# Patient Record
Sex: Female | Born: 1970 | Race: White | Hispanic: No | Marital: Married | Smoking: Former smoker
Health system: Southern US, Community
[De-identification: ages and names within clinical notes are randomized; demographics above are authoritative.]

---

## 2016-01-24 ENCOUNTER — Ambulatory Visit (INDEPENDENT_AMBULATORY_CARE_PROVIDER_SITE_OTHER): Payer: 59

## 2016-01-24 ENCOUNTER — Encounter: Payer: Self-pay | Admitting: Podiatry

## 2016-01-24 ENCOUNTER — Ambulatory Visit (INDEPENDENT_AMBULATORY_CARE_PROVIDER_SITE_OTHER): Payer: 59 | Admitting: Podiatry

## 2016-01-24 ENCOUNTER — Ambulatory Visit: Payer: Self-pay

## 2016-01-24 ENCOUNTER — Ambulatory Visit: Payer: Self-pay | Admitting: Podiatry

## 2016-01-24 VITALS — Ht 63.0 in | Wt 130.0 lb

## 2016-01-24 DIAGNOSIS — M79672 Pain in left foot: Secondary | ICD-10-CM

## 2016-01-24 DIAGNOSIS — M79671 Pain in right foot: Secondary | ICD-10-CM

## 2016-01-24 DIAGNOSIS — Q828 Other specified congenital malformations of skin: Secondary | ICD-10-CM | POA: Diagnosis not present

## 2016-01-24 DIAGNOSIS — M779 Enthesopathy, unspecified: Secondary | ICD-10-CM | POA: Diagnosis not present

## 2016-01-24 MED ORDER — TRIAMCINOLONE ACETONIDE 10 MG/ML IJ SUSP
10.0000 mg | Freq: Once | INTRAMUSCULAR | Status: AC
  Administered 2016-01-24: 10 mg

## 2016-01-24 MED ORDER — DICLOFENAC SODIUM 75 MG PO TBEC: 75.0000 mg | DELAYED_RELEASE_TABLET | Freq: Two times a day (BID) | ORAL | Status: DC

## 2016-01-24 NOTE — Progress Notes (Signed)
   Subjective:    Patient ID: Jennifer King, female    DOB: 07-14-1971, 45 y.o.   MRN: [id number]  HPI Chief Complaint  Patient presents with  . Foot Pain    Right foot; medial side-arch area; Left foot-plantar forefoot-below 4th toe; pt stated, "Feels bone in foot; hurts foot; walking on the right side of the foot; feels bone spur in Left foot"      Review of Systems  All other systems reviewed and are negative.      Objective:   Physical Exam        Assessment & Plan:

## 2016-02-07 ENCOUNTER — Ambulatory Visit (INDEPENDENT_AMBULATORY_CARE_PROVIDER_SITE_OTHER): Payer: 59 | Admitting: Podiatry

## 2016-02-07 DIAGNOSIS — M779 Enthesopathy, unspecified: Secondary | ICD-10-CM

## 2016-02-07 DIAGNOSIS — Q828 Other specified congenital malformations of skin: Secondary | ICD-10-CM | POA: Diagnosis not present

## 2016-02-08 NOTE — Progress Notes (Signed)
Subjective:     Patient ID: Jennifer King, female   DOB: 1970-08-31, 45 y.o.   MRN: [id number]  HPI patient states that she has a lot of pain on the side of her right foot and she's getting some lesion on the plantar aspect left foot which is sore when pressed. States that it's making it hard to walk on the right foot and she does not remember specific injury   Review of Systems  All other systems reviewed and are negative.      Objective:   Physical Exam  Constitutional: She is oriented to person, place, and time.  Cardiovascular: Intact distal pulses.   Musculoskeletal: Normal range of motion.  Neurological: She is oriented to person, place, and time.  Skin: Skin is warm.  Nursing note and vitals reviewed.  neurovascular status intact muscle strength adequate range of motion within normal limits with patient found to have inflammatory changes on the right foot posterior tibial tendon near its insertion into the navicular. Patient also is noted to have a small lesion plantar aspect left and moderate depression of the arch bilateral and has good digital perfusion and is well oriented 3     Assessment:     Inflammatory tendinitis right posterior tib near its insertion into the navicular with keratotic lesion left    Plan:     H&P and x-ray reviewed. Today I did careful sheath injection 3 mg Dexon some Kenalog 5 mg Xylocaine and up applied fascial brace to lift up the right arch along with ice therapy and reduced activity and not going barefoot. Debrided lesion on left which was tolerated well  X-ray report indicates moderate depression of the arch with no indication of fracture arthritis

## 2016-02-08 NOTE — Progress Notes (Signed)
Subjective:     Patient ID: Jennifer King, female   DOB: [birth year], 45 y.o.   MRN: [id number]  HPI patient states I'm doing a lot better but I did overdo it a little bit yesterday but having minimal discomfort   Review of Systems     Objective:   Physical Exam Neurovascular status intact muscle strength adequate with discomfort in the right posterior tib that's minimal in nature and mild depression of the arch bilateral    Assessment:     Tendinitis symptoms still present but improved    Plan:     Advised on the importance of physical therapy supportive shoe gear usage anti-inflammatories and not going barefoot. Reappoint if symptoms persist and we'll consider orthotics

## 2016-03-06 ENCOUNTER — Ambulatory Visit (INDEPENDENT_AMBULATORY_CARE_PROVIDER_SITE_OTHER): Payer: 59 | Admitting: Podiatry

## 2016-03-06 ENCOUNTER — Encounter: Payer: Self-pay | Admitting: Podiatry

## 2016-03-06 DIAGNOSIS — M779 Enthesopathy, unspecified: Secondary | ICD-10-CM

## 2016-03-07 NOTE — Progress Notes (Signed)
Subjective:     Patient ID: Jennifer King, female   DOB: 15-May-1971, 45 y.o.   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

## 2016-04-04 ENCOUNTER — Ambulatory Visit: Payer: 59 | Admitting: *Deleted

## 2016-04-04 DIAGNOSIS — M779 Enthesopathy, unspecified: Secondary | ICD-10-CM

## 2016-04-04 NOTE — Progress Notes (Signed)
Patient ID: Jennifer King, female   DOB: [birth year], 45 y.o.   MRN: [id number]  Patient presents for orthotic pick up.  Verbal and written break in and wear instructions given.  Patient will follow up in 4 weeks if symptoms worsen or fail to improve.

## 2016-04-04 NOTE — Patient Instructions (Signed)

## 2016-07-17 ENCOUNTER — Other Ambulatory Visit: Payer: Self-pay | Admitting: Podiatry

## 2016-07-17 NOTE — Telephone Encounter (Signed)
Pt needs an appt prior to future refills. 

## 2016-10-01 DIAGNOSIS — J019 Acute sinusitis, unspecified: Secondary | ICD-10-CM | POA: Diagnosis not present

## 2016-10-01 DIAGNOSIS — J209 Acute bronchitis, unspecified: Secondary | ICD-10-CM | POA: Diagnosis not present

## 2016-10-21 DIAGNOSIS — R109 Unspecified abdominal pain: Secondary | ICD-10-CM | POA: Diagnosis not present

## 2016-10-22 DIAGNOSIS — R3129 Other microscopic hematuria: Secondary | ICD-10-CM | POA: Diagnosis not present

## 2016-10-22 DIAGNOSIS — M549 Dorsalgia, unspecified: Secondary | ICD-10-CM | POA: Diagnosis not present

## 2016-10-22 DIAGNOSIS — K5900 Constipation, unspecified: Secondary | ICD-10-CM | POA: Diagnosis not present

## 2016-11-20 DIAGNOSIS — Z1231 Encounter for screening mammogram for malignant neoplasm of breast: Secondary | ICD-10-CM | POA: Diagnosis not present

## 2016-12-29 DIAGNOSIS — J209 Acute bronchitis, unspecified: Secondary | ICD-10-CM | POA: Diagnosis not present

## 2016-12-29 DIAGNOSIS — J019 Acute sinusitis, unspecified: Secondary | ICD-10-CM | POA: Diagnosis not present

## 2016-12-29 DIAGNOSIS — J029 Acute pharyngitis, unspecified: Secondary | ICD-10-CM | POA: Diagnosis not present

## 2017-04-15 DIAGNOSIS — R05 Cough: Secondary | ICD-10-CM | POA: Diagnosis not present

## 2017-04-15 DIAGNOSIS — R0989 Other specified symptoms and signs involving the circulatory and respiratory systems: Secondary | ICD-10-CM | POA: Diagnosis not present

## 2017-04-15 DIAGNOSIS — J209 Acute bronchitis, unspecified: Secondary | ICD-10-CM | POA: Diagnosis not present

## 2017-05-27 DIAGNOSIS — Z23 Encounter for immunization: Secondary | ICD-10-CM | POA: Diagnosis not present

## 2017-07-19 DIAGNOSIS — J0100 Acute maxillary sinusitis, unspecified: Secondary | ICD-10-CM | POA: Diagnosis not present

## 2017-11-06 DIAGNOSIS — J209 Acute bronchitis, unspecified: Secondary | ICD-10-CM | POA: Diagnosis not present

## 2017-11-06 DIAGNOSIS — J029 Acute pharyngitis, unspecified: Secondary | ICD-10-CM | POA: Diagnosis not present

## 2017-11-06 DIAGNOSIS — J019 Acute sinusitis, unspecified: Secondary | ICD-10-CM | POA: Diagnosis not present

## 2017-12-04 DIAGNOSIS — Z1231 Encounter for screening mammogram for malignant neoplasm of breast: Secondary | ICD-10-CM | POA: Diagnosis not present

## 2018-03-30 DIAGNOSIS — J019 Acute sinusitis, unspecified: Secondary | ICD-10-CM | POA: Diagnosis not present

## 2018-03-30 DIAGNOSIS — J029 Acute pharyngitis, unspecified: Secondary | ICD-10-CM | POA: Diagnosis not present

## 2018-03-30 DIAGNOSIS — J209 Acute bronchitis, unspecified: Secondary | ICD-10-CM | POA: Diagnosis not present

## 2018-05-05 DIAGNOSIS — Z23 Encounter for immunization: Secondary | ICD-10-CM | POA: Diagnosis not present

## 2018-05-12 DIAGNOSIS — Z01419 Encounter for gynecological examination (general) (routine) without abnormal findings: Secondary | ICD-10-CM | POA: Diagnosis not present

## 2018-05-12 DIAGNOSIS — Z6824 Body mass index (BMI) 24.0-24.9, adult: Secondary | ICD-10-CM | POA: Diagnosis not present

## 2018-05-12 DIAGNOSIS — Z3041 Encounter for surveillance of contraceptive pills: Secondary | ICD-10-CM | POA: Diagnosis not present

## 2018-05-28 DIAGNOSIS — N200 Calculus of kidney: Secondary | ICD-10-CM | POA: Diagnosis not present

## 2018-05-28 DIAGNOSIS — J208 Acute bronchitis due to other specified organisms: Secondary | ICD-10-CM | POA: Diagnosis not present

## 2018-05-28 DIAGNOSIS — R52 Pain, unspecified: Secondary | ICD-10-CM | POA: Diagnosis not present

## 2018-06-04 ENCOUNTER — Emergency Department (HOSPITAL_COMMUNITY): Payer: 59

## 2018-06-04 ENCOUNTER — Emergency Department (HOSPITAL_COMMUNITY)
Admission: EM | Admit: 2018-06-04 | Discharge: 2018-06-04 | Disposition: A | Discharge status: Home / Self Care | Payer: 59 | Attending: Emergency Medicine | Admitting: Emergency Medicine

## 2018-06-04 ENCOUNTER — Other Ambulatory Visit: Payer: Self-pay

## 2018-06-04 ENCOUNTER — Encounter (HOSPITAL_COMMUNITY): Payer: Self-pay

## 2018-06-04 DIAGNOSIS — F17210 Nicotine dependence, cigarettes, uncomplicated: Secondary | ICD-10-CM | POA: Diagnosis not present

## 2018-06-04 DIAGNOSIS — R1084 Generalized abdominal pain: Secondary | ICD-10-CM | POA: Diagnosis not present

## 2018-06-04 DIAGNOSIS — R05 Cough: Secondary | ICD-10-CM | POA: Diagnosis not present

## 2018-06-04 DIAGNOSIS — S301XXA Contusion of abdominal wall, initial encounter: Secondary | ICD-10-CM

## 2018-06-04 DIAGNOSIS — R109 Unspecified abdominal pain: Secondary | ICD-10-CM

## 2018-06-04 DIAGNOSIS — Z79899 Other long term (current) drug therapy: Secondary | ICD-10-CM | POA: Diagnosis not present

## 2018-06-04 DIAGNOSIS — M7981 Nontraumatic hematoma of soft tissue: Secondary | ICD-10-CM | POA: Diagnosis not present

## 2018-06-04 LAB — CBC WITH DIFFERENTIAL/PLATELET
ABS IMMATURE GRANULOCYTES: 0.03 10*3/uL (ref 0.00–0.07)
BASOS PCT: 0 %
Basophils Absolute: 0 10*3/uL (ref 0.0–0.1)
Eosinophils Absolute: 0.1 10*3/uL (ref 0.0–0.5)
Eosinophils Relative: 1 %
HCT: 41 % (ref 36.0–46.0)
HEMOGLOBIN: 13.1 g/dL (ref 12.0–15.0)
IMMATURE GRANULOCYTES: 0 %
Lymphocytes Relative: 24 %
Lymphs Abs: 3.2 10*3/uL (ref 0.7–4.0)
MCH: 29 pg (ref 26.0–34.0)
MCHC: 32 g/dL (ref 30.0–36.0)
MCV: 90.7 fL (ref 80.0–100.0)
MONO ABS: 0.8 10*3/uL (ref 0.1–1.0)
Monocytes Relative: 6 %
NEUTROS ABS: 9.2 10*3/uL — AB (ref 1.7–7.7)
NEUTROS PCT: 69 %
PLATELETS: 318 10*3/uL (ref 150–400)
RBC: 4.52 MIL/uL (ref 3.87–5.11)
RDW: 13.2 % (ref 11.5–15.5)
WBC: 13.4 10*3/uL — AB (ref 4.0–10.5)
nRBC: 0 % (ref 0.0–0.2)

## 2018-06-04 LAB — URINALYSIS, ROUTINE W REFLEX MICROSCOPIC
Bilirubin Urine: NEGATIVE
Glucose, UA: NEGATIVE mg/dL
Ketones, ur: NEGATIVE mg/dL
Leukocytes, UA: NEGATIVE
NITRITE: NEGATIVE
Protein, ur: NEGATIVE mg/dL
Specific Gravity, Urine: 1.015 (ref 1.005–1.030)
pH: 7 (ref 5.0–8.0)

## 2018-06-04 LAB — COMPREHENSIVE METABOLIC PANEL
ALK PHOS: 56 U/L (ref 38–126)
ALT: 13 U/L (ref 0–44)
AST: 16 U/L (ref 15–41)
Albumin: 4 g/dL (ref 3.5–5.0)
Anion gap: 9 (ref 5–15)
BUN: 8 mg/dL (ref 6–20)
CALCIUM: 9.3 mg/dL (ref 8.9–10.3)
CO2: 25 mmol/L (ref 22–32)
CREATININE: 0.57 mg/dL (ref 0.44–1.00)
Chloride: 104 mmol/L (ref 98–111)
Glucose, Bld: 81 mg/dL (ref 70–99)
Potassium: 4.6 mmol/L (ref 3.5–5.1)
Sodium: 138 mmol/L (ref 135–145)
Total Bilirubin: 0.6 mg/dL (ref 0.3–1.2)
Total Protein: 7.8 g/dL (ref 6.5–8.1)

## 2018-06-04 LAB — LIPASE, BLOOD: Lipase: 25 U/L (ref 11–51)

## 2018-06-04 MED ORDER — KETOROLAC TROMETHAMINE 60 MG/2ML IM SOLN
60.0000 mg | Freq: Once | INTRAMUSCULAR | Status: AC
  Administered 2018-06-04: 60 mg via INTRAMUSCULAR
  Filled 2018-06-04: qty 2

## 2018-06-04 NOTE — Discharge Instructions (Signed)
Take over the counter tylenol, as directed on packaging, as needed for discomfort. Apply moist heat to the area(s) of discomfort, for 15 minutes at a time, several times per day for the next few days.  Do not fall asleep on a heating pack.  Call your regular medical doctor today to schedule a follow up appointment within the next 3 days.  Return to the Emergency Department immediately if worsening.

## 2018-06-04 NOTE — ED Triage Notes (Signed)
Pt reports that she went urgent care due to right sided abdominal pain last friday Pt was given flomax, keflex, zofran, norco for the pain. Urine was negative. Pain seems to have worsened . Denies N/V/D

## 2018-06-04 NOTE — ED Provider Notes (Signed)
[facility] EMERGENCY DEPARTMENT Provider Note   CSN: [id number] Arrival date & time: 06/04/18  1028     History   Chief Complaint Chief Complaint  Patient presents with  . Abdominal Pain    HPI Jennifer King is a 47 y.o. female.   Abdominal Pain      Pt was seen at 1120.  Per pt, c/o gradual onset and persistence of constant right sided abd "pain" for the past 10 days.  Has been associated with no other symptoms.  Describes the abd pain as "sore."  Pt was evaluated last week at [facility], dx kidney stone, rx flomax, norco, keflex, zofran. Pt states she has been taking the meds without change of her symptoms. Denies N/V, no diarrhea, no fevers, no rash, no injury, no back pain, no CP/SOB, no black or blood in stools, no dysuria/hematuria.      History reviewed. No pertinent past medical history.  There are no active problems to display for this patient.   History reviewed. No pertinent surgical history.   OB History   None      Home Medications    Prior to Admission medications   Medication Sig Start Date End Date Taking? Authorizing Provider  cephALEXin (KEFLEX) 500 MG capsule Take 1 capsule by mouth 4 (four) times daily as needed. 05/28/18  Yes [provider]  JUNEL 1/20 1-20 MG-MCG tablet 1 TAB(S) ONCE A DAY ORALLY 84 12/09/15  Yes [provider]  diclofenac (VOLTAREN) 75 MG EC tablet TAKE 1 TABLET BY MOUTH TWO TIMES DAILY Patient not taking: Reported on 06/04/2018 07/17/16   Lenn Sink, DPM  tamsulosin (FLOMAX) 0.4 MG CAPS capsule Take 0.4 mg by mouth daily. 05/28/18   [provider]    Family History No family history on file.  Social History Social History   Tobacco Use  . Smoking status: Current Every Day Smoker    Packs/day: 0.50    Types: Cigarettes  . Smokeless tobacco: Never Used  Substance Use Topics  . Alcohol use: Not Currently  . Drug use: Not Currently     Allergies   Sulfa antibiotics   Review of  Systems Review of Systems  Gastrointestinal: Positive for abdominal pain.  ROS: Statement: All systems negative except as marked or noted in the HPI; Constitutional: Negative for fever and chills. ; ; Eyes: Negative for eye pain, redness and discharge. ; ; ENMT: Negative for ear pain, hoarseness, nasal congestion, sinus pressure and sore throat. ; ; Cardiovascular: Negative for chest pain, palpitations, diaphoresis, dyspnea and peripheral edema. ; ; Respiratory: Negative for cough, wheezing and stridor. ; ; Gastrointestinal:  +abd pain. Negative for nausea, vomiting, diarrhea, blood in stool, hematemesis, jaundice and rectal bleeding. . ; ; Genitourinary: Negative for dysuria, flank pain and hematuria. ; ; Musculoskeletal: Negative for back pain and neck pain. Negative for swelling and trauma.; ; Skin: Negative for pruritus, rash, abrasions, blisters, bruising and skin lesion.; ; Neuro: Negative for headache, lightheadedness and neck stiffness. Negative for weakness, altered level of consciousness, altered mental status, extremity weakness, paresthesias, involuntary movement, seizure and syncope.        Physical Exam Updated Vital Signs BP (!) 141/83 (BP Location: Right Arm)   Pulse 96   Temp 98 F (36.7 C) (Oral)   Wt 62.1 kg   SpO2 100%   BMI 24.27 kg/m   Physical Exam 1125: Physical examination:  Nursing notes reviewed; Vital signs and O2 SAT reviewed;  Constitutional: Well developed, Well  nourished, Well hydrated, In no acute distress; Head:  Normocephalic, atraumatic; Eyes: EOMI, PERRL, No scleral icterus; ENMT: Mouth and pharynx normal, Mucous membranes moist; Neck: Supple, Full range of motion, No lymphadenopathy; Cardiovascular: Regular rate and rhythm, No gallop; Respiratory: Breath sounds clear & equal bilaterally, No wheezes.  Speaking full sentences with ease, Normal respiratory effort/excursion; Chest: Nontender, Movement normal; Abdomen: Soft, +right sided abd tender to palp. No  rebound or guarding. Nondistended, Normal bowel sounds; Genitourinary: No CVA tenderness; Spine:  No midline CS, TS, LS tenderness.;; Extremities: Peripheral pulses normal, No tenderness, No edema, No calf edema or asymmetry.; Neuro: AA&Ox3, Major CN grossly intact.  Speech clear. No gross focal motor or sensory deficits in extremities.; Skin: Color normal, Warm, Dry.   ED Treatments / Results  Labs (all labs ordered are listed, but only abnormal results are displayed)   EKG None  Radiology   Procedures Procedures (including critical care time)  Medications Ordered in ED Medications  ketorolac (TORADOL) injection 60 mg (60 mg Intramuscular Given 06/04/18 1144)     Initial Impression / Assessment and Plan / ED Course  I have reviewed the triage vital signs and the nursing notes.  Pertinent labs & imaging results that were available during my care of the patient were reviewed by me and considered in my medical decision making (see chart for details).  MDM Reviewed: previous chart, nursing note and vitals Reviewed previous: labs Interpretation: labs, CT scan and x-ray    Results for orders placed or performed during the hospital encounter of 06/04/18  Lipase, blood  Result Value Ref Range   Lipase 25 11 - 51 U/L  Comprehensive metabolic panel  Result Value Ref Range   Sodium 138 135 - 145 mmol/L   Potassium 4.6 3.5 - 5.1 mmol/L   Chloride 104 98 - 111 mmol/L   CO2 25 22 - 32 mmol/L   Glucose, Bld 81 70 - 99 mg/dL   BUN 8 6 - 20 mg/dL   Creatinine, Ser 1.61 0.44 - 1.00 mg/dL   Calcium 9.3 8.9 - 09.6 mg/dL   Total Protein 7.8 6.5 - 8.1 g/dL   Albumin 4.0 3.5 - 5.0 g/dL   AST 16 15 - 41 U/L   ALT 13 0 - 44 U/L   Alkaline Phosphatase 56 38 - 126 U/L   Total Bilirubin 0.6 0.3 - 1.2 mg/dL   GFR calc non Af Amer >60 >60 mL/min   GFR calc Af Amer >60 >60 mL/min   Anion gap 9 5 - 15  Urinalysis, Routine w reflex microscopic  Result Value Ref Range   Color, Urine YELLOW  YELLOW   APPearance CLEAR CLEAR   Specific Gravity, Urine 1.015 1.005 - 1.030   pH 7.0 5.0 - 8.0   Glucose, UA NEGATIVE NEGATIVE mg/dL   Hgb urine dipstick MODERATE (A) NEGATIVE   Bilirubin Urine NEGATIVE NEGATIVE   Ketones, ur NEGATIVE NEGATIVE mg/dL   Protein, ur NEGATIVE NEGATIVE mg/dL   Nitrite NEGATIVE NEGATIVE   Leukocytes, UA NEGATIVE NEGATIVE   RBC / HPF 0-5 0 - 5 RBC/hpf   WBC, UA 0-5 0 - 5 WBC/hpf   Bacteria, UA RARE (A) NONE SEEN   Squamous Epithelial / LPF 0-5 0 - 5   Mucus PRESENT   CBC with Differential  Result Value Ref Range   WBC 13.4 (H) 4.0 - 10.5 K/uL   RBC 4.52 3.87 - 5.11 MIL/uL   Hemoglobin 13.1 12.0 - 15.0 g/dL   HCT 41.0  36.0 - 46.0 %   MCV 90.7 80.0 - 100.0 fL   MCH 29.0 26.0 - 34.0 pg   MCHC 32.0 30.0 - 36.0 g/dL   RDW 16.113.2 09.611.5 - 04.515.5 %   Platelets 318 150 - 400 K/uL   nRBC 0.0 0.0 - 0.2 %   Neutrophils Relative % 69 %   Neutro Abs 9.2 (H) 1.7 - 7.7 K/uL   Lymphocytes Relative 24 %   Lymphs Abs 3.2 0.7 - 4.0 K/uL   Monocytes Relative 6 %   Monocytes Absolute 0.8 0.1 - 1.0 K/uL   Eosinophils Relative 1 %   Eosinophils Absolute 0.1 0.0 - 0.5 K/uL   Basophils Relative 0 %   Basophils Absolute 0.0 0.0 - 0.1 K/uL   Immature Granulocytes 0 %   Abs Immature Granulocytes 0.03 0.00 - 0.07 K/uL   Dg Chest 2 View Result Date: 06/04/2018 CLINICAL DATA:  Cough and congestion EXAM: CHEST - 2 VIEW COMPARISON:  None. FINDINGS: Lungs are clear. Heart size and pulmonary vascularity are normal. No adenopathy. No bone lesions. IMPRESSION: No edema or consolidation. Electronically Signed   By: Bretta BangWilliam  Woodruff III M.D.   On: 06/04/2018 12:21   Ct Renal Stone Study Result Date: 06/04/2018 CLINICAL DATA:  Acute right abdominal pain EXAM: CT ABDOMEN AND PELVIS WITHOUT CONTRAST TECHNIQUE: Multidetector CT imaging of the abdomen and pelvis was performed following the standard protocol without IV contrast. COMPARISON:  None. FINDINGS: Lower chest: No acute  abnormality. Hepatobiliary: No focal liver abnormality is seen. No gallstones, gallbladder wall thickening, or biliary dilatation. Pancreas: Unremarkable. No pancreatic ductal dilatation or surrounding inflammatory changes. Spleen: Normal in size without focal abnormality. Adrenals/Urinary Tract: Adrenal glands are unremarkable. Kidneys are normal, without renal calculi, focal lesion, or hydronephrosis. Bladder is unremarkable. Stomach/Bowel: Negative for bowel obstruction, significant dilatation, ileus, free air. Appendix unremarkable. Minor scattered colonic diverticulosis. No acute inflammatory process, free fluid, fluid collection or abscess. No ascites. Vascular/Lymphatic: Minor atherosclerosis. Negative for aneurysm. No adenopathy. Retroperitoneal hemorrhage or hematoma. Reproductive: Uterus and bilateral adnexa are unremarkable. Other: Right anterior abdominal wall hyperdense rectus sheath masslike area measures 4.6 x 3.0 cm, compatible with a right rectus sheath hematoma. Adjacent strandy edema/bruising in right abdominal wall and subcutaneous areas of the right upper quadrant. Musculoskeletal: No acute osseous finding. Preserved vertebral body heights. IMPRESSION: Right abdominal wall rectus sheath hyperdense hematoma measuring up to 4.6 cm with adjacent strandy edema/bruising in the right abdominal wall. No other acute intra-abdominal or pelvic finding by noncontrast CT. Electronically Signed   By: Judie PetitM.  Shick M.D.   On: 06/04/2018 12:33     1430:  CT as above; pt absolutely denies injury and pt not taking anticoagulants. H/H and VS stable after 10 days of symptoms. Tx symptomatically, f/u PMD. Dx and testing d/w pt.  Questions answered.  Verb understanding, agreeable to d/c home with outpt f/u.      Final Clinical Impressions(s) / ED Diagnoses   Final diagnoses:  None    ED Discharge Orders    None       Samuel JesterMcManus, Remon Quinto, DO 06/07/18 2204

## 2019-04-05 ENCOUNTER — Other Ambulatory Visit (HOSPITAL_COMMUNITY): Payer: Self-pay | Admitting: Obstetrics and Gynecology

## 2019-04-05 DIAGNOSIS — Z1231 Encounter for screening mammogram for malignant neoplasm of breast: Secondary | ICD-10-CM

## 2019-04-20 ENCOUNTER — Inpatient Hospital Stay (HOSPITAL_COMMUNITY): Admission: RE | Admit: 2019-04-20 | Payer: 59 | Source: Ambulatory Visit

## 2019-04-21 ENCOUNTER — Ambulatory Visit (HOSPITAL_COMMUNITY)
Admission: RE | Admit: 2019-04-21 | Discharge: 2019-04-21 | Disposition: A | Discharge status: Home / Self Care | Payer: 59 | Source: Ambulatory Visit | Attending: Obstetrics and Gynecology | Admitting: Obstetrics and Gynecology

## 2019-04-21 ENCOUNTER — Other Ambulatory Visit: Payer: Self-pay

## 2019-04-21 DIAGNOSIS — Z1231 Encounter for screening mammogram for malignant neoplasm of breast: Secondary | ICD-10-CM | POA: Diagnosis present

## 2019-10-21 IMAGING — MG MM DIGITAL SCREENING BILAT W/ TOMO W/ CAD
8 series · 9 of 24 positions shown · non-contrast
Comparison: Previous exam(s).

CLINICAL DATA: Screening.

EXAM:
DIGITAL SCREENING BILATERAL MAMMOGRAM WITH TOMO AND CAD

[R CC synth-2D]
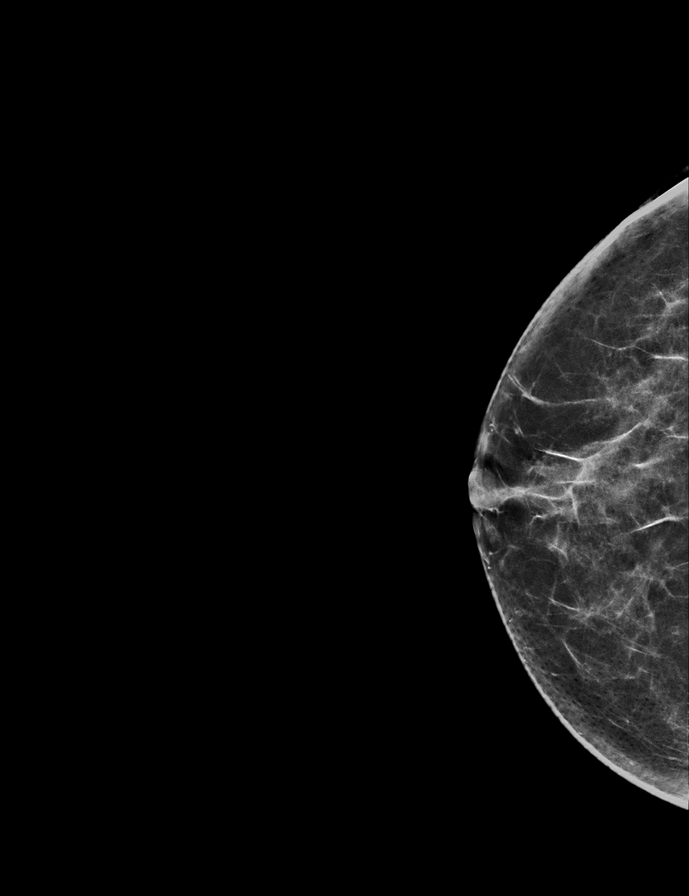

[R MLO synth-2D]
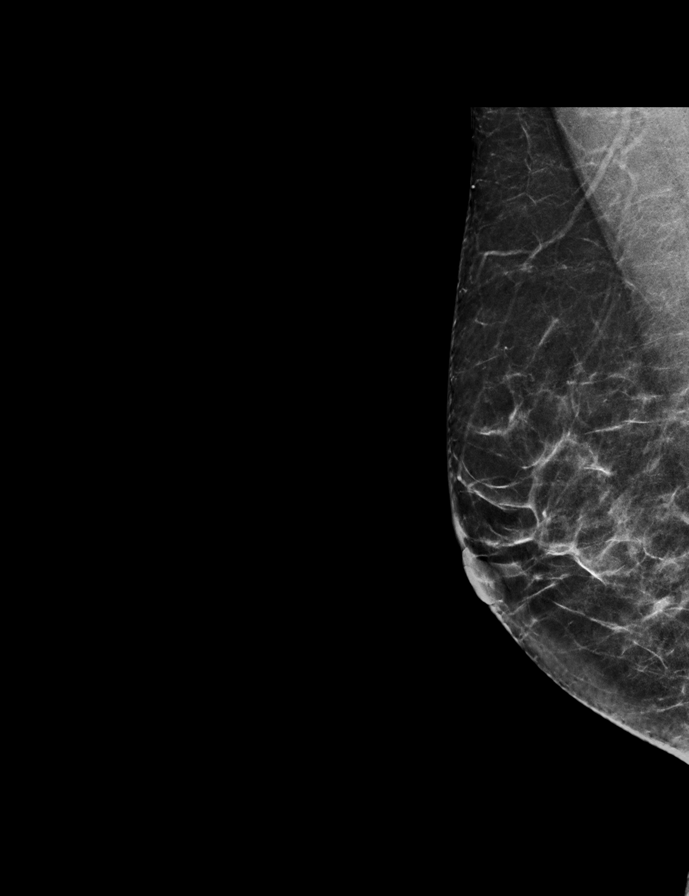

[L MLO synth-2D]
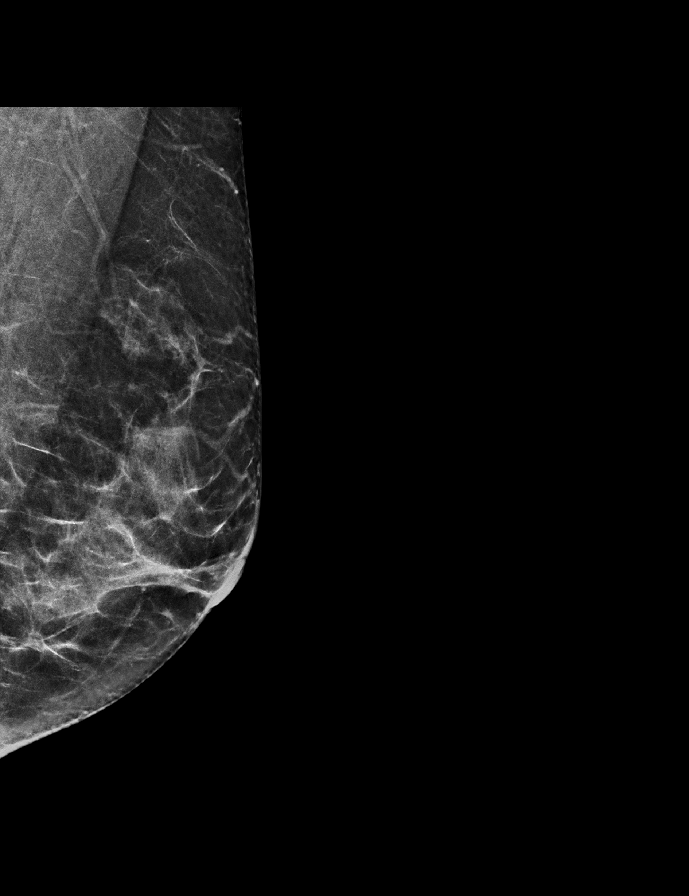

[L CC synth-2D]
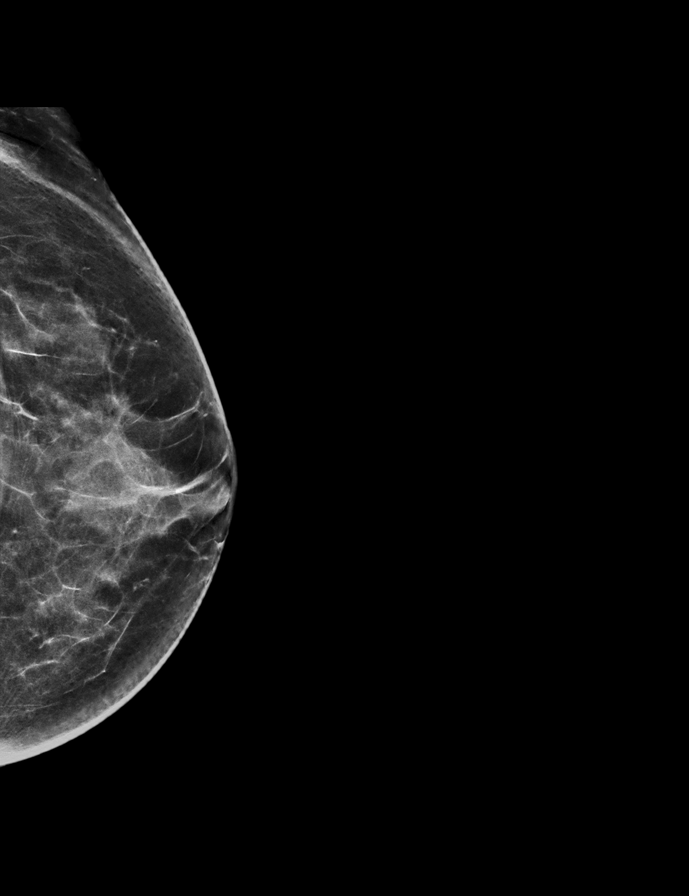

[L CC tomo · 2 of 65 frames shown]
[frame 21/65]
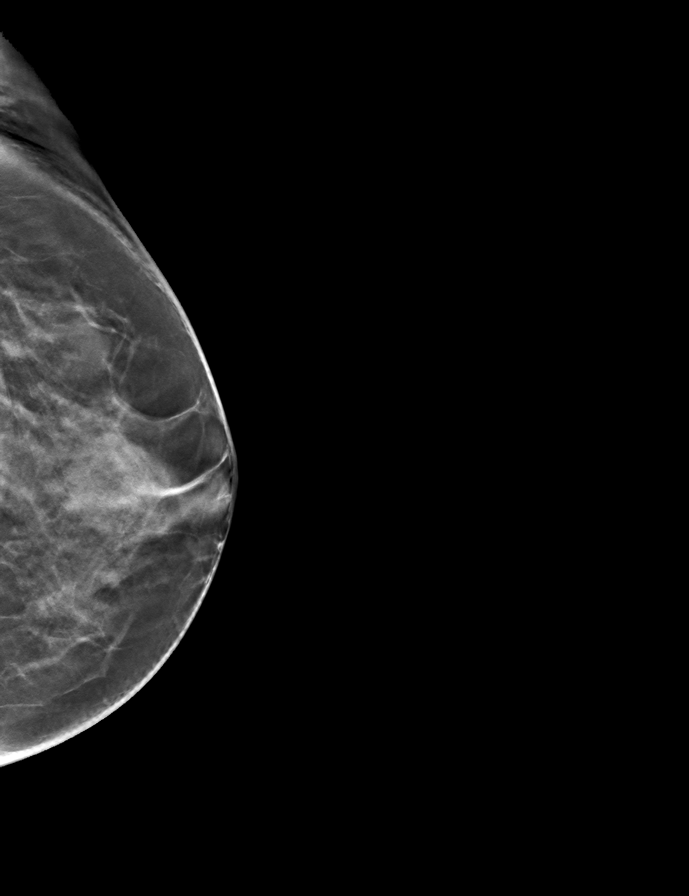
[frame 33/65]
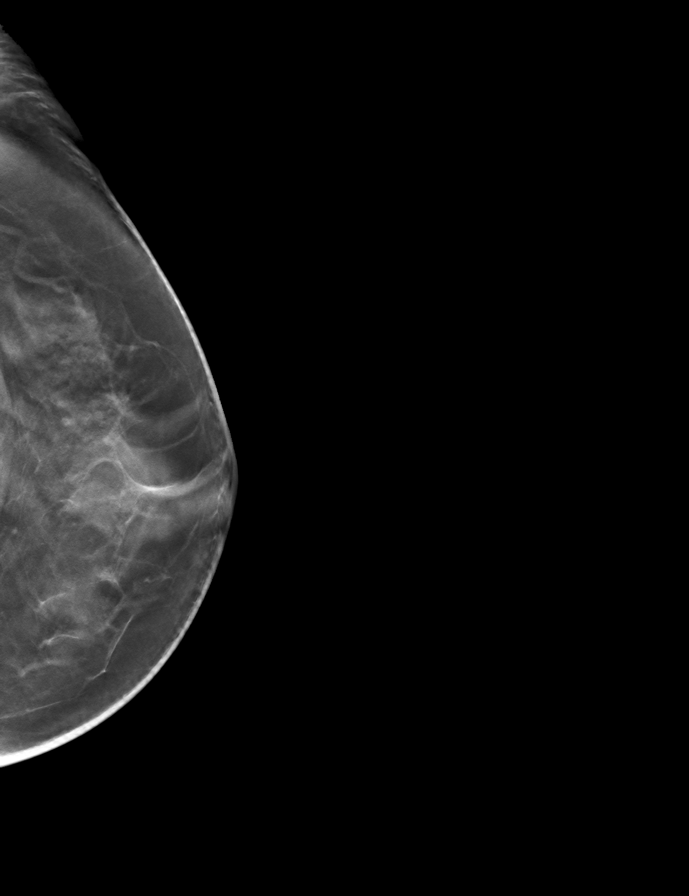

[R CC tomo · tomo slice 31/61.0]
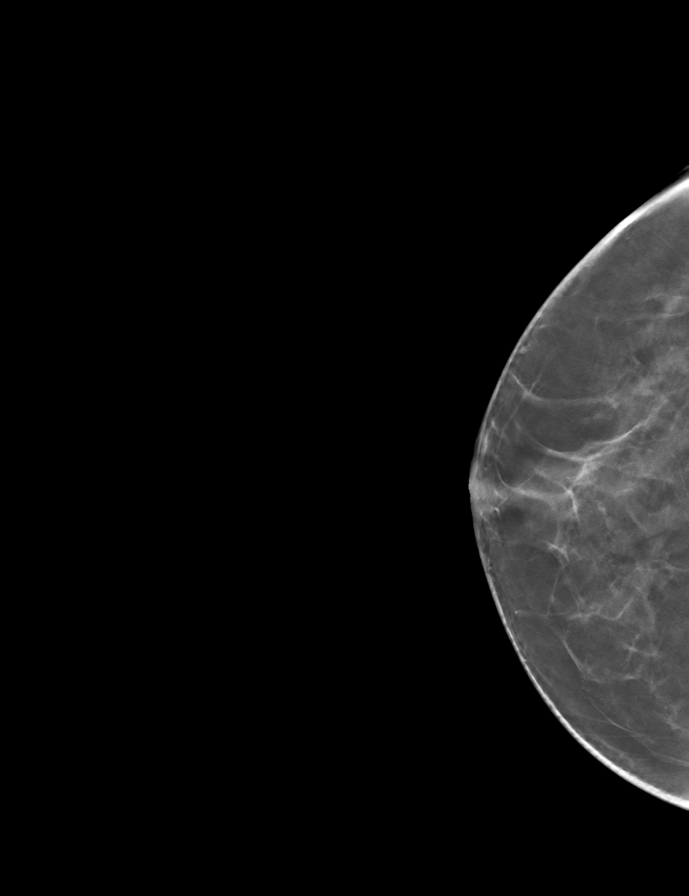

[L MLO tomo · tomo slice 29/56.0]
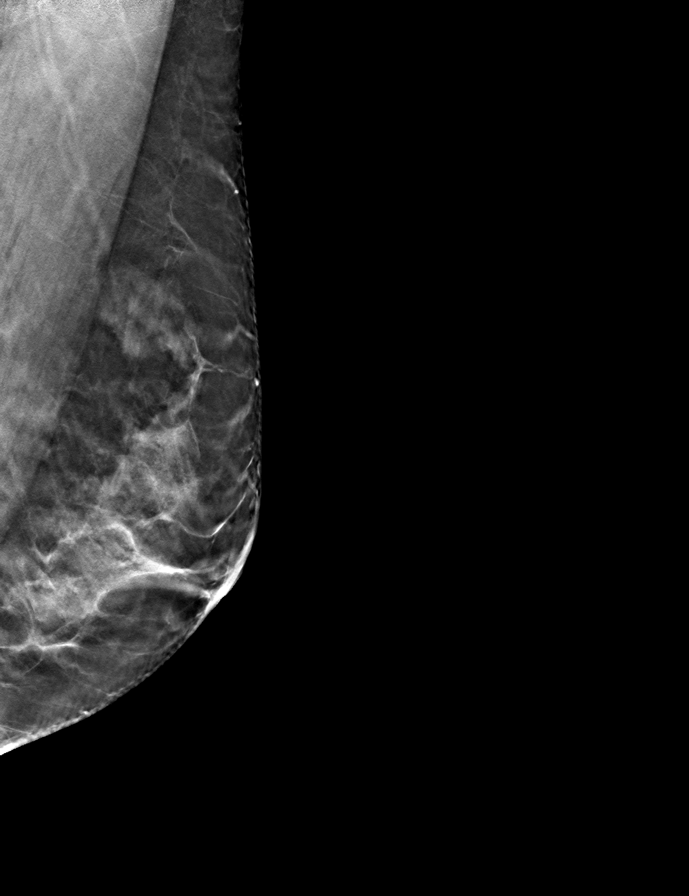

[R MLO tomo · tomo slice 32/63.0]
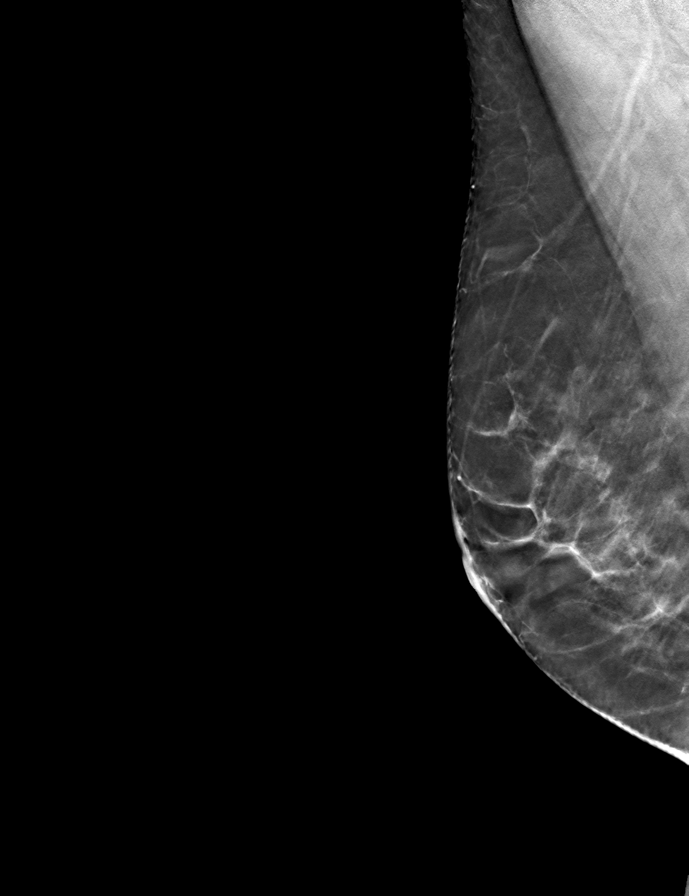

[9 of 24 positions shown; findings below may reference images not displayed]

ACR Breast Density Category c: The breast tissue is heterogeneously
dense, which may obscure small masses.
FINDINGS: There are no findings suspicious for malignancy. Images were
processed with CAD.
IMPRESSION: No mammographic evidence of malignancy. A result letter of this
screening mammogram will be mailed directly to the patient.

RECOMMENDATION:
Screening mammogram in one year. (Code:[id number])

BI-RADS CATEGORY  1: Negative.

## 2019-12-09 ENCOUNTER — Ambulatory Visit
Admission: EM | Admit: 2019-12-09 | Discharge: 2019-12-09 | Disposition: A | Discharge status: Home / Self Care | Payer: 59

## 2019-12-09 ENCOUNTER — Other Ambulatory Visit: Payer: Self-pay

## 2019-12-09 DIAGNOSIS — R059 Cough, unspecified: Secondary | ICD-10-CM

## 2019-12-09 DIAGNOSIS — R0982 Postnasal drip: Secondary | ICD-10-CM

## 2019-12-09 DIAGNOSIS — M5441 Lumbago with sciatica, right side: Secondary | ICD-10-CM

## 2019-12-09 DIAGNOSIS — R05 Cough: Secondary | ICD-10-CM

## 2019-12-09 DIAGNOSIS — J3489 Other specified disorders of nose and nasal sinuses: Secondary | ICD-10-CM

## 2019-12-09 DIAGNOSIS — R519 Headache, unspecified: Secondary | ICD-10-CM

## 2019-12-09 MED ORDER — GUAIFENESIN 200 MG PO TABS: 200.0000 mg | ORAL_TABLET | ORAL | 0 refills | Status: DC | PRN

## 2019-12-09 MED ORDER — BENZONATATE 100 MG PO CAPS: 100.0000 mg | ORAL_CAPSULE | Freq: Three times a day (TID) | ORAL | 0 refills | Status: DC

## 2019-12-09 MED ORDER — PREDNISONE 10 MG (21) PO TBPK: ORAL_TABLET | Freq: Every day | ORAL | 0 refills | Status: AC

## 2019-12-09 NOTE — ED Triage Notes (Signed)
Pt c/o runny nose and headache x 1 week, denies fever, declined COVID testing  Low back pain radiating down right leg x 1 month

## 2019-12-09 NOTE — Discharge Instructions (Addendum)
I have sent in a steroid taper for you to take to help with your sciatica.  I have sent in a decongestant as well as the tessalon perles as well.  Continue with Zyrtec  Follow up if you are not improving over the next 3-4 days.

## 2019-12-09 NOTE — ED Provider Notes (Signed)
[place]   [id number] 12/09/19 Arrival Time: 0919  CZ:YSAYT PAIN  SUBJECTIVE: History from: patient. Ronnita Paz is a 49 y.o. female complains of right low back and leg pain that began about a month. Denies a precipitating event or specific injury. Describes the pain as intermittent and achy in character. Has tried OTC medications with temporary relief. Symptoms are made worse with activity. Denies similar symptoms in the past. Also reports cough, congestion and post nasal drip for the last 3 days. Denies fever, chills, erythema, ecchymosis, effusion, weakness, numbness and tingling, saddle paresthesias, loss of bowel or bladder function.      ROS: As per HPI.  All other pertinent ROS negative.     History reviewed. No pertinent past medical history. History reviewed. No pertinent surgical history. Allergies  Allergen Reactions  . Sulfa Antibiotics Hives   No current facility-administered medications on file prior to encounter.   Current Outpatient Medications on File Prior to Encounter  Medication Sig Dispense Refill  . cetirizine (ZYRTEC) 5 MG tablet Take 5 mg by mouth daily.    . JUNEL 1/20 1-20 MG-MCG tablet 1 TAB(S) ONCE A DAY ORALLY 84  3   Social History   Socioeconomic History  . Marital status: Married    Spouse name: Not on file  . Number of children: Not on file  . Years of education: Not on file  . Highest education level: Not on file  Occupational History  . Not on file  Tobacco Use  . Smoking status: Current Every Day Smoker    Packs/day: 0.50    Types: Cigarettes  . Smokeless tobacco: Never Used  Substance and Sexual Activity  . Alcohol use: Not Currently  . Drug use: Not Currently  . Sexual activity: Not on file  Other Topics Concern  . Not on file  Social History Narrative  . Not on file   Social Determinants of Health   Financial Resource Strain:   . Difficulty of Paying Living Expenses:   Food Insecurity:   . Worried About  Charity fundraiser in the Last Year:   . Arboriculturist in the Last Year:   Transportation Needs:   . Film/video editor (Medical):   Marland Kitchen Lack of Transportation (Non-Medical):   Physical Activity:   . Days of Exercise per Week:   . Minutes of Exercise per Session:   Stress:   . Feeling of Stress :   Social Connections:   . Frequency of Communication with Friends and Family:   . Frequency of Social Gatherings with Friends and Family:   . Attends Religious Services:   . Active Member of Clubs or Organizations:   . Attends Archivist Meetings:   Marland Kitchen Marital Status:   Intimate Partner Violence:   . Fear of Current or Ex-Partner:   . Emotionally Abused:   Marland Kitchen Physically Abused:   . Sexually Abused:    No family history on file.  OBJECTIVE:  Vitals:   12/09/19 0927  BP: (!) 159/91  Pulse: 97  Resp: 16  Temp: 98.6 F (37 C)  SpO2: 99%    General appearance: ALERT; in no acute distress.  Head: NCAT, nasal congestion and post nasal drip noted Lungs: Normal respiratory effort CV: radial and pedal pulses 2+ bilaterally. Cap refill < 2 seconds Musculoskeletal:  Inspection: Skin warm, dry, clear and intact without obvious erythema, effusion, or ecchymosis.  Palpation: Nontender to palpation ROM: FROM active and passive Strength: 5/5 shld  abduction, 5/5 shld adduction, 5/5 elbow flexion, 5/5 elbow extension, 5/5 grip strength, 5/5 hip flexion, 5/5 knee abduction, 5/5 knee adduction, 5/5 knee flexion, 5/5 knee extension, 5/5 dorsiflexion, 5/5 plantar flexion Stability: Anterior/ posterior drawer intact Positive straight leg raise with R leg Skin: warm and dry Neurologic: Ambulates without difficulty; Sensation intact about the upper/ lower extremities Psychological: alert and cooperative; normal mood and affect  DIAGNOSTIC STUDIES:  No results found.   ASSESSMENT & PLAN:  1. Nonintractable headache, unspecified chronicity pattern, unspecified headache type   2.  Rhinorrhea   3. Acute right-sided low back pain with right-sided sciatica   4. Cough   5. Post-nasal drip     Meds ordered this encounter  Medications  . benzonatate (TESSALON) 100 MG capsule    Sig: Take 1 capsule (100 mg total) by mouth every 8 (eight) hours.    Dispense:  21 capsule    Refill:  0    Order Specific Question:   Supervising Provider    Answer:   Merrilee Jansky [id number]  . guaiFENesin 200 MG tablet    Sig: Take 1 tablet (200 mg total) by mouth every 4 (four) hours as needed for cough or to loosen phlegm.    Dispense:  30 suppository    Refill:  0    Order Specific Question:   Supervising Provider    Answer:   Merrilee Jansky [id number]  . predniSONE (STERAPRED UNI-PAK 21 TAB) 10 MG (21) TBPK tablet    Sig: Take by mouth daily for 6 days. Take 6 tablets on day 1, 5 tablets on day 2, 4 tablets on day 3, 3 tablets on day 4, 2 tablets on day 5, 1 tablet on day 6    Dispense:  21 tablet    Refill:  0    Order Specific Question:   Supervising Provider    Answer:   Merrilee Jansky [id number]    Prescribed prednisone for you to take over the next 6 days to help with your sciatica. Prescribed tessalon perles for cough prn Prescribed guaifenesin for congestion Continue conservative management of rest, ice, and gentle stretches Take naproxen as needed for pain relief (may cause abdominal discomfort, ulcers, and GI bleeds avoid taking with other NSAIDs) Follow up with PCP if symptoms persist Return or go to the ER if you have any new or worsening symptoms (fever, chills, chest pain, abdominal pain, changes in bowel or bladder habits, pain radiating into lower legs).  Reviewed expectations re: course of current medical issues. Questions answered. Outlined signs and symptoms indicating need for more acute intervention. Patient verbalized understanding. After Visit Summary given.       Moshe Cipro, NP 12/09/19 [phone]

## 2019-12-12 ENCOUNTER — Ambulatory Visit: Payer: Self-pay | Attending: Internal Medicine

## 2019-12-12 DIAGNOSIS — Z23 Encounter for immunization: Secondary | ICD-10-CM

## 2019-12-12 NOTE — Progress Notes (Signed)
   Covid-19 Vaccination Clinic  Name:  Jennifer King    MRN: [id number] DOB: Mar 24, 1971  12/12/2019  Ms. Jennifer King was observed post Covid-19 immunization for 15 minutes without incident. She was provided with Vaccine Information Sheet and instruction to access the V-Safe system.   Ms. Jennifer King was instructed to call 911 with any severe reactions post vaccine: Marland Kitchen Difficulty breathing  . Swelling of face and throat  . A fast heartbeat  . A bad rash all over body  . Dizziness and weakness   Immunizations Administered    Name Date Dose VIS Date Route   Pfizer COVID-19 Vaccine 12/12/2019  2:07 PM 0.3 mL 09/14/2018 Intramuscular   Manufacturer: [employer], [employer]   Lot: [id number]   NDC: [address]-3

## 2020-01-02 ENCOUNTER — Ambulatory Visit: Payer: 59 | Attending: Internal Medicine

## 2020-01-02 DIAGNOSIS — Z23 Encounter for immunization: Secondary | ICD-10-CM

## 2020-01-02 NOTE — Progress Notes (Signed)
° °  Covid-19 Vaccination Clinic  Name:  Jennifer King    MRN: [id number] DOB: 05/13/1971  01/02/2020  [name] was observed post Covid-19 immunization for 15 minutes without incident. She was provided with Vaccine Information Sheet and instruction to access the V-Safe system.   [name] was instructed to call 911 with any severe reactions post vaccine:  Difficulty breathing   Swelling of face and throat   A fast heartbeat   A bad rash all over body   Dizziness and weakness   Immunizations Administered    Name Date Dose VIS Date Route   Pfizer COVID-19 Vaccine 01/02/2020  1:13 PM 0.3 mL 09/14/2018 Intramuscular   Manufacturer: [employer], [employer]   Lot: [id number]   NDC: [address]-7

## 2020-01-04 ENCOUNTER — Ambulatory Visit
Admission: EM | Admit: 2020-01-04 | Discharge: 2020-01-04 | Disposition: A | Discharge status: Home / Self Care | Payer: 59

## 2020-01-04 DIAGNOSIS — R590 Localized enlarged lymph nodes: Secondary | ICD-10-CM

## 2020-01-04 DIAGNOSIS — R221 Localized swelling, mass and lump, neck: Secondary | ICD-10-CM

## 2020-01-04 NOTE — ED Provider Notes (Signed)
[facility]   [id number] 01/04/20 Arrival Time: 8315  VV:OHYWV PAIN  SUBJECTIVE: History from: patient. Tiffanyann Deroo is a 49 y.o. female complains of lump to Left neck at clavicle for the last 2 days after she received her second dose of Covid vaccine. Describes the area as puffy, tender to touch, but otherwise does not bother her. Has not tried OTC medications. There are not aggravating or alleviating factors.  Denies similar symptoms in the past.  Denies fever, chills, erythema, ecchymosis, effusion, weakness, numbness and tingling, saddle paresthesias, loss of bowel or bladder function.      ROS: As per HPI.  All other pertinent ROS negative.     History reviewed. No pertinent past medical history. History reviewed. No pertinent surgical history. Allergies  Allergen Reactions  . Sulfa Antibiotics Hives   No current facility-administered medications on file prior to encounter.   Current Outpatient Medications on File Prior to Encounter  Medication Sig Dispense Refill  . benzonatate (TESSALON) 100 MG capsule Take 1 capsule (100 mg total) by mouth every 8 (eight) hours. 21 capsule 0  . cetirizine (ZYRTEC) 5 MG tablet Take 5 mg by mouth daily.    Marland Kitchen guaiFENesin 200 MG tablet Take 1 tablet (200 mg total) by mouth every 4 (four) hours as needed for cough or to loosen phlegm. 30 suppository 0  . JUNEL 1/20 1-20 MG-MCG tablet 1 TAB(S) ONCE A DAY ORALLY 84  3   Social History   Socioeconomic History  . Marital status: Married    Spouse name: Not on file  . Number of children: Not on file  . Years of education: Not on file  . Highest education level: Not on file  Occupational History  . Not on file  Tobacco Use  . Smoking status: Current Every Day Smoker    Packs/day: 0.50    Types: Cigarettes  . Smokeless tobacco: Never Used  Substance and Sexual Activity  . Alcohol use: Not Currently  . Drug use: Not Currently  . Sexual activity: Not on file  Other Topics Concern    . Not on file  Social History Narrative  . Not on file   Social Determinants of Health   Financial Resource Strain:   . Difficulty of Paying Living Expenses:   Food Insecurity:   . Worried About Programme researcher, broadcasting/film/video in the Last Year:   . Barista in the Last Year:   Transportation Needs:   . Freight forwarder (Medical):   Marland Kitchen Lack of Transportation (Non-Medical):   Physical Activity:   . Days of Exercise per Week:   . Minutes of Exercise per Session:   Stress:   . Feeling of Stress :   Social Connections:   . Frequency of Communication with Friends and Family:   . Frequency of Social Gatherings with Friends and Family:   . Attends Religious Services:   . Active Member of Clubs or Organizations:   . Attends Banker Meetings:   Marland Kitchen Marital Status:   Intimate Partner Violence:   . Fear of Current or Ex-Partner:   . Emotionally Abused:   Marland Kitchen Physically Abused:   . Sexually Abused:    Family History  Problem Relation Age of Onset  . Healthy Mother   . Healthy Father     OBJECTIVE:  Vitals:   01/04/20 0929  BP: (!) 143/92  Pulse: 97  Resp: 18  Temp: 98.2 F (36.8 C)  SpO2: 97%  General appearance: ALERT; in no acute distress.  Head: NCAT Lungs: Normal respiratory effort CV:  pulses 2+ bilaterally. Cap refill < 2 seconds Musculoskeletal:  Inspection: Skin warm, dry, clear and intact without obvious erythema or ecchymosis. Lump to L neck and clavicle, about 2cm in diameter Palpation: Lump is mildly tender to palpation, area is soft ROM: FROM active and passive Skin: warm and dry Neurologic: Ambulates without difficulty; Sensation intact about the upper/ lower extremities Psychological: alert and cooperative; normal mood and affect  DIAGNOSTIC STUDIES:  No results found.   ASSESSMENT & PLAN:  1. Lump on neck   2. Lymphadenopathy of left cervical region     No orders of the defined types were placed in this  encounter.  Lymphadenopathy Apply ice to the area Take ibuprofen as needed The area should gradually resolve on its own  Continue conservative management of rest, ice, and gentle stretches Take naproxen as needed for pain relief (may cause abdominal discomfort, ulcers, and GI bleeds avoid taking with other NSAIDs) Take cyclobenzaprine at nighttime for symptomatic relief. Avoid driving or operating heavy machinery while using medication. Follow up with PCP if symptoms persist Return or go to the ER if you have any new or worsening symptoms (fever, chills, chest pain, abdominal pain, changes in bowel or bladder habits, pain radiating into lower legs)   Reviewed expectations re: course of current medical issues. Questions answered. Outlined signs and symptoms indicating need for more acute intervention. Patient verbalized understanding. After Visit Summary given.       Faustino Congress, NP 01/04/20 1002

## 2020-01-04 NOTE — Discharge Instructions (Signed)
You are likely experiencing lymphadenopathy   This could be related to your vaccine  I would call and report this to the CDC  Follow up if this continues to bother you over the next week

## 2020-01-04 NOTE — ED Triage Notes (Signed)
Pt has swollen area on left clavicle area that is tender to touch

## 2020-04-06 ENCOUNTER — Other Ambulatory Visit (HOSPITAL_COMMUNITY): Payer: Self-pay | Admitting: Obstetrics and Gynecology

## 2020-04-06 DIAGNOSIS — Z1231 Encounter for screening mammogram for malignant neoplasm of breast: Secondary | ICD-10-CM

## 2020-04-23 ENCOUNTER — Other Ambulatory Visit: Payer: Self-pay

## 2020-04-23 ENCOUNTER — Ambulatory Visit (HOSPITAL_COMMUNITY)
Admission: RE | Admit: 2020-04-23 | Discharge: 2020-04-23 | Disposition: A | Discharge status: Home / Self Care | Payer: 59 | Source: Ambulatory Visit | Attending: Obstetrics and Gynecology | Admitting: Obstetrics and Gynecology

## 2020-04-23 DIAGNOSIS — Z1231 Encounter for screening mammogram for malignant neoplasm of breast: Secondary | ICD-10-CM | POA: Insufficient documentation

## 2020-09-05 ENCOUNTER — Ambulatory Visit
Admission: EM | Admit: 2020-09-05 | Discharge: 2020-09-05 | Disposition: A | Discharge status: Home / Self Care | Payer: 59 | Attending: Emergency Medicine | Admitting: Emergency Medicine

## 2020-09-05 ENCOUNTER — Encounter: Payer: Self-pay | Admitting: Emergency Medicine

## 2020-09-05 ENCOUNTER — Other Ambulatory Visit: Payer: Self-pay

## 2020-09-05 DIAGNOSIS — M5442 Lumbago with sciatica, left side: Secondary | ICD-10-CM

## 2020-09-05 DIAGNOSIS — J0110 Acute frontal sinusitis, unspecified: Secondary | ICD-10-CM | POA: Diagnosis not present

## 2020-09-05 DIAGNOSIS — M5441 Lumbago with sciatica, right side: Secondary | ICD-10-CM | POA: Diagnosis not present

## 2020-09-05 MED ORDER — PREDNISONE 10 MG (21) PO TBPK: ORAL_TABLET | Freq: Every day | ORAL | 0 refills | Status: DC

## 2020-09-05 MED ORDER — CYCLOBENZAPRINE HCL 10 MG PO TABS: 10.0000 mg | ORAL_TABLET | Freq: Every day | ORAL | 0 refills | Status: DC

## 2020-09-05 MED ORDER — FLUTICASONE PROPIONATE 50 MCG/ACT NA SUSP: 1.0000 | Freq: Every day | NASAL | 0 refills | Status: DC

## 2020-09-05 NOTE — ED Triage Notes (Signed)
Sinus pain for the last few days.  Dry cough and sore right sided back pain.

## 2020-09-05 NOTE — ED Provider Notes (Addendum)
[facility]   [id number] 09/05/20 Arrival Time: 1234   Chief Complaint  Patient presents with  . Back Pain     SUBJECTIVE: History from: patient.  Jennifer King is a 50 y.o. female who presented to the urgent care with a complaint of right side low back pain, bilateral posterior thigh pain and sinus pressure for the past few days.  Denies precipitating event, trauma, injury or exposure to COVID.  Localized pain to the right low back and posterior thigh.  He describes the pain as constant and achy.  He has tried OTC medications without relief.  His symptoms are made worse with ROM.  He denies similar symptoms in the past.  Denies chills, fever, nausea, vomiting, diarrhea, paresthesia, blurry vision, diplopia.  ROS: As per HPI.  All other pertinent ROS negative.     History reviewed. No pertinent past medical history. History reviewed. No pertinent surgical history. Allergies  Allergen Reactions  . Sulfa Antibiotics Hives   No current facility-administered medications on file prior to encounter.   Current Outpatient Medications on File Prior to Encounter  Medication Sig Dispense Refill  . benzonatate (TESSALON) 100 MG capsule Take 1 capsule (100 mg total) by mouth every 8 (eight) hours. 21 capsule 0  . cetirizine (ZYRTEC) 5 MG tablet Take 5 mg by mouth daily.    Marland Kitchen guaiFENesin 200 MG tablet Take 1 tablet (200 mg total) by mouth every 4 (four) hours as needed for cough or to loosen phlegm. 30 suppository 0  . JUNEL 1/20 1-20 MG-MCG tablet 1 TAB(S) ONCE A DAY ORALLY 84  3   Social History   Socioeconomic History  . Marital status: Married    Spouse name: Not on file  . Number of children: Not on file  . Years of education: Not on file  . Highest education level: Not on file  Occupational History  . Not on file  Tobacco Use  . Smoking status: Current Every Day Smoker    Packs/day: 0.50    Types: Cigarettes  . Smokeless tobacco: Never Used  Substance and Sexual  Activity  . Alcohol use: Not Currently  . Drug use: Not Currently  . Sexual activity: Not on file  Other Topics Concern  . Not on file  Social History Narrative  . Not on file   Social Determinants of Health   Financial Resource Strain: Not on file  Food Insecurity: Not on file  Transportation Needs: Not on file  Physical Activity: Not on file  Stress: Not on file  Social Connections: Not on file  Intimate Partner Violence: Not on file   Family History  Problem Relation Age of Onset  . Healthy Mother   . Healthy Father     OBJECTIVE:  Vitals:   09/05/20 1241  BP: (!) 150/94  Pulse: (!) 102  Resp: 18  Temp: (!) 97.3 F (36.3 C)  TempSrc: Oral  SpO2: 98%     Physical Exam Vitals and nursing note reviewed.  Constitutional:      General: She is not in acute distress.    Appearance: Normal appearance. She is normal weight. She is not ill-appearing, toxic-appearing or diaphoretic.  HENT:     Head: Normocephalic.     Nose:     Right Sinus: Frontal sinus tenderness present.     Left Sinus: Frontal sinus tenderness present.  Cardiovascular:     Rate and Rhythm: Normal rate and regular rhythm.     Pulses: Normal pulses.  Heart sounds: Normal heart sounds. No murmur heard. No friction rub. No gallop.   Pulmonary:     Effort: Pulmonary effort is normal. No respiratory distress.     Breath sounds: Normal breath sounds. No stridor. No wheezing, rhonchi or rales.  Chest:     Chest wall: No tenderness.  Musculoskeletal:        General: Tenderness present.     Lumbar back: Spasms and tenderness present.     Comments: Back:  Patient ambulates from chair to exam table without difficulty.  Inspection: Skin clear and intact without obvious swelling, erythema, or ecchymosis. Warm to the touch  Palpation: Vertebral processes nontender. Tenderness about the lower right paravertebral muscles  ROM: FROM Strength: 5/5 hip flexion, 5/5 knee extension, 5/5 knee flexion, 5/5  plantar flexion, 5/5 dorsiflexion    Neurological:     Mental Status: She is alert and oriented to person, place, and time.     LABS:  No results found for this or any previous visit (from the past 24 hour(s)).   ASSESSMENT & PLAN:  1. Acute right-sided low back pain with bilateral sciatica   2. Acute non-recurrent frontal sinusitis     Meds ordered this encounter  Medications  . cyclobenzaprine (FLEXERIL) 10 MG tablet    Sig: Take 1 tablet (10 mg total) by mouth at bedtime.    Dispense:  30 tablet    Refill:  0  . predniSONE (STERAPRED UNI-PAK 21 TAB) 10 MG (21) TBPK tablet    Sig: Take by mouth daily. Take 6 tabs by mouth daily  for 1 days, then 5 tabs for 1 days, then 4 tabs for 1 days, then 3 tabs for 1 days, 2 tabs for 1 days, then 1 tab by mouth daily for 1 days    Dispense:  21 tablet    Refill:  0  . fluticasone (FLONASE) 50 MCG/ACT nasal spray    Sig: Place 1 spray into both nostrils daily for 14 days.    Dispense:  16 g    Refill:  0    Discharge instructions  Rest, ice and heat as needed Ensure adequate ROM as tolerated. Continue to take OTC Tylenol 500 mg as needed for pain Prescribed prednisone  prescribed flexeril  for muscle spasm.  Do not drive or operate heavy machinery while taking this medication Return here or go to ER if you have any new or worsening symptoms such as numbness/tingling of the inner thighs, loss of bladder or bowel control, headache/blurry vision, nausea/vomiting, confusion/altered mental status, dizziness, weakness, passing out, imbalance, etc...    Reviewed expectations re: course of current medical issues. Questions answered. Outlined signs and symptoms indicating need for more acute intervention. Patient verbalized understanding. After Visit Summary given.         Durward Parcel, FNP 09/05/20 1300    Durward Parcel, FNP 09/05/20 1301

## 2020-09-05 NOTE — Discharge Instructions (Addendum)
Rest, ice and heat as needed Ensure adequate ROM as tolerated. Continue to take OTC Tylenol 500 mg as needed for pain Prescribed prednisone  prescribed flexeril  for muscle spasm.  Do not drive or operate heavy machinery while taking this medication Return here or go to ER if you have any new or worsening symptoms such as numbness/tingling of the inner thighs, loss of bladder or bowel control, headache/blurry vision, nausea/vomiting, confusion/altered mental status, dizziness, weakness, passing out, imbalance, etc..Marland Kitchen

## 2021-04-22 ENCOUNTER — Other Ambulatory Visit (HOSPITAL_COMMUNITY): Payer: Self-pay

## 2021-04-22 ENCOUNTER — Other Ambulatory Visit (HOSPITAL_COMMUNITY): Payer: Self-pay | Admitting: Obstetrics and Gynecology

## 2021-04-22 DIAGNOSIS — Z1231 Encounter for screening mammogram for malignant neoplasm of breast: Secondary | ICD-10-CM

## 2021-04-29 ENCOUNTER — Other Ambulatory Visit: Payer: Self-pay

## 2021-04-29 ENCOUNTER — Ambulatory Visit (HOSPITAL_COMMUNITY)
Admission: RE | Admit: 2021-04-29 | Discharge: 2021-04-29 | Disposition: A | Discharge status: Home / Self Care | Payer: 59 | Source: Ambulatory Visit | Attending: Obstetrics and Gynecology | Admitting: Obstetrics and Gynecology

## 2021-04-29 DIAGNOSIS — Z1231 Encounter for screening mammogram for malignant neoplasm of breast: Secondary | ICD-10-CM | POA: Diagnosis not present

## 2022-04-15 ENCOUNTER — Other Ambulatory Visit (HOSPITAL_COMMUNITY): Payer: Self-pay | Admitting: Obstetrics and Gynecology

## 2022-04-15 DIAGNOSIS — Z1231 Encounter for screening mammogram for malignant neoplasm of breast: Secondary | ICD-10-CM

## 2022-05-01 ENCOUNTER — Ambulatory Visit (HOSPITAL_COMMUNITY)
Admission: RE | Admit: 2022-05-01 | Discharge: 2022-05-01 | Disposition: A | Discharge status: Home / Self Care | Payer: 59 | Source: Ambulatory Visit | Attending: Obstetrics and Gynecology | Admitting: Obstetrics and Gynecology

## 2022-05-01 DIAGNOSIS — Z1231 Encounter for screening mammogram for malignant neoplasm of breast: Secondary | ICD-10-CM | POA: Insufficient documentation

## 2022-05-02 ENCOUNTER — Other Ambulatory Visit (HOSPITAL_COMMUNITY): Payer: Self-pay | Admitting: Obstetrics and Gynecology

## 2022-05-05 ENCOUNTER — Other Ambulatory Visit (HOSPITAL_COMMUNITY): Payer: Self-pay | Admitting: Obstetrics and Gynecology

## 2022-05-05 DIAGNOSIS — R928 Other abnormal and inconclusive findings on diagnostic imaging of breast: Secondary | ICD-10-CM

## 2022-05-15 ENCOUNTER — Ambulatory Visit (HOSPITAL_COMMUNITY)
Admission: RE | Admit: 2022-05-15 | Discharge: 2022-05-15 | Disposition: A | Discharge status: Home / Self Care | Payer: 59 | Source: Ambulatory Visit | Attending: Obstetrics and Gynecology | Admitting: Obstetrics and Gynecology

## 2022-05-15 DIAGNOSIS — R928 Other abnormal and inconclusive findings on diagnostic imaging of breast: Secondary | ICD-10-CM | POA: Diagnosis present

## 2023-05-05 ENCOUNTER — Other Ambulatory Visit (HOSPITAL_COMMUNITY): Payer: Self-pay | Admitting: Obstetrics and Gynecology

## 2023-05-05 DIAGNOSIS — Z1231 Encounter for screening mammogram for malignant neoplasm of breast: Secondary | ICD-10-CM

## 2023-05-18 ENCOUNTER — Ambulatory Visit (HOSPITAL_COMMUNITY): Payer: 59

## 2023-05-20 ENCOUNTER — Ambulatory Visit (HOSPITAL_COMMUNITY): Payer: 59

## 2023-06-26 ENCOUNTER — Emergency Department (HOSPITAL_COMMUNITY): Payer: 59

## 2023-06-26 ENCOUNTER — Encounter (HOSPITAL_COMMUNITY): Payer: Self-pay

## 2023-06-26 ENCOUNTER — Inpatient Hospital Stay (HOSPITAL_COMMUNITY)
Admission: EM | Admit: 2023-06-26 | Discharge: 2023-07-01 | DRG: 280 | Disposition: A | Discharge status: Home / Self Care | Payer: 59 | Attending: Family Medicine | Admitting: Family Medicine

## 2023-06-26 ENCOUNTER — Other Ambulatory Visit: Payer: Self-pay

## 2023-06-26 DIAGNOSIS — Z882 Allergy status to sulfonamides status: Secondary | ICD-10-CM

## 2023-06-26 DIAGNOSIS — J9601 Acute respiratory failure with hypoxia: Secondary | ICD-10-CM | POA: Diagnosis present

## 2023-06-26 DIAGNOSIS — I472 Ventricular tachycardia, unspecified: Secondary | ICD-10-CM | POA: Diagnosis not present

## 2023-06-26 DIAGNOSIS — R0602 Shortness of breath: Principal | ICD-10-CM

## 2023-06-26 DIAGNOSIS — R7989 Other specified abnormal findings of blood chemistry: Secondary | ICD-10-CM

## 2023-06-26 DIAGNOSIS — I272 Pulmonary hypertension, unspecified: Secondary | ICD-10-CM | POA: Diagnosis present

## 2023-06-26 DIAGNOSIS — Z79899 Other long term (current) drug therapy: Secondary | ICD-10-CM

## 2023-06-26 DIAGNOSIS — I340 Nonrheumatic mitral (valve) insufficiency: Secondary | ICD-10-CM | POA: Diagnosis present

## 2023-06-26 DIAGNOSIS — I400 Infective myocarditis: Secondary | ICD-10-CM | POA: Diagnosis present

## 2023-06-26 DIAGNOSIS — Z7984 Long term (current) use of oral hypoglycemic drugs: Secondary | ICD-10-CM

## 2023-06-26 DIAGNOSIS — B9789 Other viral agents as the cause of diseases classified elsewhere: Secondary | ICD-10-CM | POA: Diagnosis present

## 2023-06-26 DIAGNOSIS — J189 Pneumonia, unspecified organism: Secondary | ICD-10-CM | POA: Diagnosis present

## 2023-06-26 DIAGNOSIS — E785 Hyperlipidemia, unspecified: Secondary | ICD-10-CM | POA: Diagnosis present

## 2023-06-26 DIAGNOSIS — A419 Sepsis, unspecified organism: Secondary | ICD-10-CM

## 2023-06-26 DIAGNOSIS — E874 Mixed disorder of acid-base balance: Secondary | ICD-10-CM | POA: Diagnosis present

## 2023-06-26 DIAGNOSIS — F17210 Nicotine dependence, cigarettes, uncomplicated: Secondary | ICD-10-CM | POA: Diagnosis present

## 2023-06-26 DIAGNOSIS — I5023 Acute on chronic systolic (congestive) heart failure: Secondary | ICD-10-CM | POA: Diagnosis present

## 2023-06-26 DIAGNOSIS — I428 Other cardiomyopathies: Secondary | ICD-10-CM | POA: Diagnosis present

## 2023-06-26 DIAGNOSIS — I471 Supraventricular tachycardia, unspecified: Secondary | ICD-10-CM | POA: Diagnosis not present

## 2023-06-26 DIAGNOSIS — I2510 Atherosclerotic heart disease of native coronary artery without angina pectoris: Secondary | ICD-10-CM | POA: Diagnosis present

## 2023-06-26 DIAGNOSIS — Z7982 Long term (current) use of aspirin: Secondary | ICD-10-CM

## 2023-06-26 DIAGNOSIS — I214 Non-ST elevation (NSTEMI) myocardial infarction: Secondary | ICD-10-CM | POA: Diagnosis not present

## 2023-06-26 DIAGNOSIS — Z720 Tobacco use: Secondary | ICD-10-CM | POA: Insufficient documentation

## 2023-06-26 DIAGNOSIS — Z79890 Hormone replacement therapy (postmenopausal): Secondary | ICD-10-CM

## 2023-06-26 LAB — CBC WITH DIFFERENTIAL/PLATELET
Abs Immature Granulocytes: 0.03 10*3/uL (ref 0.00–0.07)
Basophils Absolute: 0 10*3/uL (ref 0.0–0.1)
Basophils Relative: 0 %
Eosinophils Absolute: 0.1 10*3/uL (ref 0.0–0.5)
Eosinophils Relative: 0 %
HCT: 39.1 % (ref 36.0–46.0)
Hemoglobin: 12.7 g/dL (ref 12.0–15.0)
Immature Granulocytes: 0 %
Lymphocytes Relative: 25 %
Lymphs Abs: 2.8 10*3/uL (ref 0.7–4.0)
MCH: 29.5 pg (ref 26.0–34.0)
MCHC: 32.5 g/dL (ref 30.0–36.0)
MCV: 90.7 fL (ref 80.0–100.0)
Monocytes Absolute: 0.7 10*3/uL (ref 0.1–1.0)
Monocytes Relative: 6 %
Neutro Abs: 7.8 10*3/uL — ABNORMAL HIGH (ref 1.7–7.7)
Neutrophils Relative %: 69 %
Platelets: 155 10*3/uL (ref 150–400)
RBC: 4.31 MIL/uL (ref 3.87–5.11)
RDW: 14.8 % (ref 11.5–15.5)
WBC: 11.4 10*3/uL — ABNORMAL HIGH (ref 4.0–10.5)
nRBC: 0.4 % — ABNORMAL HIGH (ref 0.0–0.2)

## 2023-06-26 LAB — BASIC METABOLIC PANEL
Anion gap: 12 (ref 5–15)
BUN: 15 mg/dL (ref 6–20)
CO2: 18 mmol/L — ABNORMAL LOW (ref 22–32)
Calcium: 8.9 mg/dL (ref 8.9–10.3)
Chloride: 105 mmol/L (ref 98–111)
Creatinine, Ser: 0.72 mg/dL (ref 0.44–1.00)
GFR, Estimated: >60 mL/min (ref >60)
Glucose, Bld: 100 mg/dL — ABNORMAL HIGH (ref 70–99)
Potassium: 3.6 mmol/L (ref 3.5–5.1)
Sodium: 135 mmol/L (ref 135–145)

## 2023-06-26 LAB — RESP PANEL BY RT-PCR (RSV, FLU A&B, COVID)  RVPGX2
Influenza A by PCR: NEGATIVE
Influenza B by PCR: NEGATIVE
Resp Syncytial Virus by PCR: NEGATIVE
SARS Coronavirus 2 by RT PCR: NEGATIVE

## 2023-06-26 LAB — TROPONIN I (HIGH SENSITIVITY): Troponin I (High Sensitivity): 1168 ng/L (ref <18)

## 2023-06-26 LAB — D-DIMER, QUANTITATIVE: D-Dimer, Quant: 4.55 ug{FEU}/mL — ABNORMAL HIGH (ref 0.00–0.50)

## 2023-06-26 MED ORDER — IPRATROPIUM-ALBUTEROL 0.5-2.5 (3) MG/3ML IN SOLN
3.0000 mL | Freq: Once | RESPIRATORY_TRACT | Status: AC
  Administered 2023-06-26: 3 mL via RESPIRATORY_TRACT
  Filled 2023-06-26: qty 3

## 2023-06-26 MED ORDER — ASPIRIN 81 MG PO CHEW
324.0000 mg | CHEWABLE_TABLET | Freq: Once | ORAL | Status: AC
  Administered 2023-06-26: 324 mg via ORAL
  Filled 2023-06-26: qty 4

## 2023-06-26 MED ORDER — IOHEXOL 350 MG/ML SOLN
75.0000 mL | Freq: Once | INTRAVENOUS | Status: AC | PRN
  Administered 2023-06-26: 75 mL via INTRAVENOUS

## 2023-06-26 MED ORDER — ALBUTEROL SULFATE (2.5 MG/3ML) 0.083% IN NEBU: 2.5000 mg | INHALATION_SOLUTION | RESPIRATORY_TRACT | Status: DC | PRN

## 2023-06-26 MED ORDER — ALBUTEROL SULFATE HFA 108 (90 BASE) MCG/ACT IN AERS
2.0000 | INHALATION_SPRAY | RESPIRATORY_TRACT | Status: DC | PRN
  Administered 2023-06-26: 2 via RESPIRATORY_TRACT
  Filled 2023-06-26: qty 6.7

## 2023-06-26 NOTE — ED Provider Notes (Signed)
Care assumed from [facility], patient with shortness of breath, elevated troponin and elevated d-dimer pending CT angiogram of the chest.  A CT angiogram is negative for acute pulmonary embolism but findings of cardiomegaly and moderate right pleural effusion and diffuse hazy pulmonary density suggestive of pulmonary edema.  I have independently viewed the images, and agree with radiologist's interpretation.  I have discussed case with Dr. Arville Care of [facility], and he was concerned about elevated troponin and patient still having pain.  Repeat troponin is essentially flat, I do not feel she has ongoing cardiac ischemia.  Overall picture with respiratory symptoms and elevated troponin and findings suggestive of heart failure point more towards a viral myocarditis.  I discussed case with Dr. Hyacinth Meeker of cardiology service who agrees that she will need cardiology evaluation but does not feel she needs emergent catheterization and prefers that she be admitted under the hospitalist service with cardiology on consultation.  I have discussed case with Dr. Arville Care, once again, and he agrees to admit the patient.  She will need to be transferred to [facility] where she can obtain cardiology consultation.  I did order a third troponin which has also come back essentially unchanged.  CRITICAL CARE Performed by: Dione Booze Total critical care time: 50 minutes Critical care time was exclusive of separately billable procedures and treating other patients. Critical care was necessary to treat or prevent imminent or life-threatening deterioration. Critical care was time spent personally by me on the following activities: development of treatment plan with patient and/or surrogate as well as nursing, discussions with consultants, evaluation of patient's response to treatment, examination of patient, obtaining history from patient or surrogate, ordering and performing treatments and interventions,  ordering and review of laboratory studies, ordering and review of radiographic studies, pulse oximetry and re-evaluation of patient's condition.  Results for orders placed or performed during the hospital encounter of 06/26/23  Resp panel by RT-PCR (RSV, Flu A&B, Covid) Anterior Nasal Swab   Specimen: Anterior Nasal Swab  Result Value Ref Range   SARS Coronavirus 2 by RT PCR NEGATIVE NEGATIVE   Influenza A by PCR NEGATIVE NEGATIVE   Influenza B by PCR NEGATIVE NEGATIVE   Resp Syncytial Virus by PCR NEGATIVE NEGATIVE  Basic metabolic panel  Result Value Ref Range   Sodium 135 135 - 145 mmol/L   Potassium 3.6 3.5 - 5.1 mmol/L   Chloride 105 98 - 111 mmol/L   CO2 18 (L) 22 - 32 mmol/L   Glucose, Bld 100 (H) 70 - 99 mg/dL   BUN 15 6 - 20 mg/dL   Creatinine, Ser 6.21 0.44 - 1.00 mg/dL   Calcium 8.9 8.9 - 30.8 mg/dL   GFR, Estimated >65 >78 mL/min   Anion gap 12 5 - 15  CBC with Differential  Result Value Ref Range   WBC 11.4 (H) 4.0 - 10.5 K/uL   RBC 4.31 3.87 - 5.11 MIL/uL   Hemoglobin 12.7 12.0 - 15.0 g/dL   HCT 46.9 62.9 - 52.8 %   MCV 90.7 80.0 - 100.0 fL   MCH 29.5 26.0 - 34.0 pg   MCHC 32.5 30.0 - 36.0 g/dL   RDW 41.3 24.4 - 01.0 %   Platelets 155 150 - 400 K/uL   nRBC 0.4 (H) 0.0 - 0.2 %   Neutrophils Relative % 69 %   Neutro Abs 7.8 (H) 1.7 - 7.7 K/uL   Lymphocytes Relative 25 %   Lymphs Abs 2.8 0.7 - 4.0 K/uL  Monocytes Relative 6 %   Monocytes Absolute 0.7 0.1 - 1.0 K/uL   Eosinophils Relative 0 %   Eosinophils Absolute 0.1 0.0 - 0.5 K/uL   Basophils Relative 0 %   Basophils Absolute 0.0 0.0 - 0.1 K/uL   Immature Granulocytes 0 %   Abs Immature Granulocytes 0.03 0.00 - 0.07 K/uL  D-dimer, quantitative  Result Value Ref Range   D-Dimer, Quant 4.55 (H) 0.00 - 0.50 ug/mL-FEU  Heparin level (unfractionated)  Result Value Ref Range   Heparin Unfractionated 0.10 (L) 0.30 - 0.70 IU/mL  Lactic acid, plasma  Result Value Ref Range   Lactic Acid, Venous 3.6 (HH) 0.5  - 1.9 mmol/L  Protime-INR  Result Value Ref Range   Prothrombin Time 18.1 (H) 11.4 - 15.2 seconds   INR 1.5 (H) 0.8 - 1.2  APTT  Result Value Ref Range   aPTT 47 (H) 24 - 36 seconds  Troponin I (High Sensitivity)  Result Value Ref Range   Troponin I (High Sensitivity) 1,168 (HH) <18 ng/L  Troponin I (High Sensitivity)  Result Value Ref Range   Troponin I (High Sensitivity) 1,033 (HH) <18 ng/L  Troponin I (High Sensitivity)  Result Value Ref Range   Troponin I (High Sensitivity) 1,067 (HH) <18 ng/L   CT Angio Chest PE W/Cm &/Or Wo Cm  Result Date: 06/26/2023 CLINICAL DATA:  Shortness of breath with cough EXAM: CT ANGIOGRAPHY CHEST WITH CONTRAST TECHNIQUE: Multidetector CT imaging of the chest was performed using the standard protocol during bolus administration of intravenous contrast. Multiplanar CT image reconstructions and MIPs were obtained to evaluate the vascular anatomy. RADIATION DOSE REDUCTION: This exam was performed according to the departmental dose-optimization program which includes automated exposure control, adjustment of the mA and/or kV according to patient size and/or use of iterative reconstruction technique. CONTRAST:  75mL OMNIPAQUE IOHEXOL 350 MG/ML SOLN COMPARISON:  Chest x-ray 06/26/2023 FINDINGS: Cardiovascular: Satisfactory opacification of the pulmonary arteries to the segmental level. No evidence of pulmonary embolism. Nonaneurysmal aorta. Cardiomegaly. No pericardial effusion Mediastinum/Nodes: Patent trachea. No thyroid mass. Multiple mildly enlarged mediastinal lymph nodes. Right upper paratracheal node measures 10 mm. Prevascular lymph nodes measure up to 13 mm. Low right paratracheal nodes up to 10 mm. Esophagus within normal limits Lungs/Pleura: Moderate right pleural effusion. Diffuse hazy pulmonary density and mild diffuse septal thickening. Minimal ground-glass density in the right lower lobe. Upper Abdomen: No acute finding Musculoskeletal: No chest wall  abnormality. No acute or significant osseous findings. Review of the MIP images confirms the above findings. IMPRESSION: 1. Negative for acute pulmonary embolus. 2. Cardiomegaly with moderate right pleural effusion and diffuse hazy pulmonary density and mild septal thickening, findings are suggestive of pulmonary edema. Minimal ground-glass density in the right lower lobe, possible edema or infection. 3. Mildly enlarged mediastinal lymph nodes, likely reactive. Electronically Signed   By: Jasmine Pang M.D.   On: 06/26/2023 23:50   DG Chest 2 View  Result Date: 06/26/2023 CLINICAL DATA:  Shortness of breath EXAM: CHEST - 2 VIEW COMPARISON:  06/04/2018 FINDINGS: Cardiomegaly with slight central congestion. No mild airspace disease at the right base. Possible tiny right effusion. No pneumothorax IMPRESSION: Cardiomegaly with slight central congestion and possible tiny right effusion. Mild airspace disease at right base, atelectasis versus pneumonia. Electronically Signed   By: Jasmine Pang M.D.   On: 06/26/2023 19:30      Dione Booze, MD 06/27/23 [phone]

## 2023-06-26 NOTE — ED Provider Notes (Signed)
[place] EMERGENCY DEPARTMENT AT [facility] Provider Note   CSN: [id number] Arrival date & time: 06/26/23  1731     History  Chief Complaint  Patient presents with   Shortness of Breath   Cough    Jennifer King is a 52 y.o. female.  With no reported past medical history presenting to the ED for evaluation of shortness of breath and cough.  Symptoms have been present for 1.5 weeks.  Initially presented to urgent care and was prescribed azithromycin and a steroid pack.  Symptoms did not improve so she presented again this week and was given another steroid injection.  Her symptoms remain constant so she presents today for further evaluation.  Shortness of breath is at baseline.  She reports cough is productive of clear sputum.  She reports some mild chest pain when coughing.  She reports intermittent fevers and chills.  Some nausea but no vomiting.  She has an extensive history of smoking, smoked 1/2 pack/day for multiple decades.  She has a cough at baseline.   Shortness of Breath Associated symptoms: cough   Cough Associated symptoms: shortness of breath        Home Medications Prior to Admission medications   Medication Sig Start Date End Date Taking? Authorizing Provider  benzonatate (TESSALON) 100 MG capsule Take 1 capsule (100 mg total) by mouth every 8 (eight) hours. 12/09/19   Moshe Cipro, FNP  cetirizine (ZYRTEC) 5 MG tablet Take 5 mg by mouth daily.    [provider]  cyclobenzaprine (FLEXERIL) 10 MG tablet Take 1 tablet (10 mg total) by mouth at bedtime. 09/05/20   Avegno, Zachery Dakins, FNP  fluticasone (FLONASE) 50 MCG/ACT nasal spray Place 1 spray into both nostrils daily for 14 days. 09/05/20 09/19/20  Avegno, Zachery Dakins, FNP  guaiFENesin 200 MG tablet Take 1 tablet (200 mg total) by mouth every 4 (four) hours as needed for cough or to loosen phlegm. 12/09/19   Moshe Cipro, FNP  JUNEL 1/20 1-20 MG-MCG tablet 1 TAB(S) ONCE A DAY ORALLY 84  12/09/15   [provider]  predniSONE (STERAPRED UNI-PAK 21 TAB) 10 MG (21) TBPK tablet Take by mouth daily. Take 6 tabs by mouth daily  for 1 days, then 5 tabs for 1 days, then 4 tabs for 1 days, then 3 tabs for 1 days, 2 tabs for 1 days, then 1 tab by mouth daily for 1 days 09/05/20   Durward Parcel, FNP      Allergies    Sulfa antibiotics    Review of Systems   Review of Systems  Respiratory:  Positive for cough and shortness of breath.   All other systems reviewed and are negative.   Physical Exam Updated Vital Signs BP 120/87   Pulse (!) 123   Temp 98.3 F (36.8 C) (Oral)   Resp (!) 28   Ht 5\' 3"  (1.6 m)   Wt 63.5 kg   SpO2 100%   BMI 24.80 kg/m  Physical Exam Vitals and nursing note reviewed.  Constitutional:      General: She is not in acute distress.    Appearance: She is well-developed.     Comments: Resting comfortably in bed  HENT:     Head: Normocephalic and atraumatic.  Eyes:     Conjunctiva/sclera: Conjunctivae normal.  Cardiovascular:     Rate and Rhythm: Regular rhythm. Tachycardia present.     Heart sounds: No murmur heard. Pulmonary:     Effort: Pulmonary effort  is normal. No respiratory distress.     Breath sounds: Decreased breath sounds present. No wheezing, rhonchi or rales.     Comments: Slight increased work of breathing, speaking in full sentences Abdominal:     Palpations: Abdomen is soft.     Tenderness: There is no abdominal tenderness.  Musculoskeletal:        General: No swelling.     Cervical back: Neck supple.  Skin:    General: Skin is warm and dry.     Capillary Refill: Capillary refill takes less than 2 seconds.  Neurological:     Mental Status: She is alert.  Psychiatric:        Mood and Affect: Mood normal.     ED Results / Procedures / Treatments   Labs (all labs ordered are listed, but only abnormal results are displayed) Labs Reviewed  BASIC METABOLIC PANEL - Abnormal; Notable for the following  components:      Result Value   CO2 18 (*)    Glucose, Bld 100 (*)    All other components within normal limits  CBC WITH DIFFERENTIAL/PLATELET - Abnormal; Notable for the following components:   WBC 11.4 (*)    nRBC 0.4 (*)    Neutro Abs 7.8 (*)    All other components within normal limits  D-DIMER, QUANTITATIVE - Abnormal; Notable for the following components:   D-Dimer, Quant 4.55 (*)    All other components within normal limits  TROPONIN I (HIGH SENSITIVITY) - Abnormal; Notable for the following components:   Troponin I (High Sensitivity) 1,168 (*)    All other components within normal limits  RESP PANEL BY RT-PCR (RSV, FLU A&B, COVID)  [id number]  TROPONIN I (HIGH SENSITIVITY)    EKG EKG Interpretation Date/Time:  Friday June 26 2023 17:58:56 EST Ventricular Rate:  123 PR Interval:  148 QRS Duration:  78 QT Interval:  308 QTC Calculation: 440 R Axis:   92  Text Interpretation: Sinus tachycardia Rightward axis Cannot rule out Inferior infarct , age undetermined ST & T wave abnormality, consider lateral ischemia Abnormal ECG No previous ECGs available Confirmed by Bethann Berkshire [phone]) on 06/26/2023 10:42:35 PM  Radiology DG Chest 2 View  Result Date: 06/26/2023 CLINICAL DATA:  Shortness of breath EXAM: CHEST - 2 VIEW COMPARISON:  06/04/2018 FINDINGS: Cardiomegaly with slight central congestion. No mild airspace disease at the right base. Possible tiny right effusion. No pneumothorax IMPRESSION: Cardiomegaly with slight central congestion and possible tiny right effusion. Mild airspace disease at right base, atelectasis versus pneumonia. Electronically Signed   By: Jasmine Pang M.D.   On: 06/26/2023 19:30    Procedures Procedures    Medications Ordered in ED Medications  albuterol (VENTOLIN HFA) 108 (90 Base) MCG/ACT inhaler 2 puff (2 puffs Inhalation Given 06/26/23 1800)  iohexol (OMNIPAQUE) 350 MG/ML injection 75 mL (has no administration in time range)  aspirin  chewable tablet 324 mg (has no administration in time range)  ipratropium-albuterol (DUONEB) 0.5-2.5 (3) MG/3ML nebulizer solution 3 mL (3 mLs Nebulization Given 06/26/23 2211)    ED Course/ Medical Decision Making/ A&P                                 Medical Decision Making Amount and/or Complexity of Data Reviewed Labs: ordered. Radiology: ordered.  Risk Prescription drug management.  This patient presents to the ED for concern of shortness of breath, this involves an extensive number  of treatment options, and is a complaint that carries with it a high risk of complications and morbidity. The emergent differential diagnosis for shortness of breath includes, but is not limited to, Pulmonary edema, bronchoconstriction, Pneumonia, Pulmonary embolism, Pneumotherax/ Hemothorax, Dysrythmia, ACS.    My initial workup includes labs, imaging, EKG, symptom control  Additional history obtained from: Nursing notes from this visit.  I ordered, reviewed and interpreted labs which include: CBC, BMP, troponin, D-dimer, respiratory panel.  Respiratory panel negative.  Leukocytosis of 11.4.  Bicarb decreased to 18 with a normal anion gap.  D-dimer elevated to 4.55.  Initial troponin elevated to 1168.  I ordered imaging studies including chest x-ray I independently visualized and interpreted imaging which showed slight central congestion, mild airspace disease in the right lower lobe potential for pneumonia I agree with the radiologist interpretation  Cardiac Monitoring:  The patient was maintained on a cardiac monitor.  I personally viewed and interpreted the cardiac monitored which showed an underlying rhythm of: Sinus tachycardia  Afebrile, tachycardic and tachypneic but otherwise hemodynamically stable.  52 year old female presenting for evaluation of shortness of breath over the past approximately 10 days.  Has been on antibiotics and steroids for this but continues to have symptoms.  Has history  of smoking, quit approximately 2 weeks ago.  On exam, she is tachypneic with slightly increased work of breathing.  She is persistently tachycardic.  She denies any chest pain outside of episodes of coughing.  Lab workup with a significantly elevated D-dimer and an initial troponin of 1168.  Overall most suspicious for pulmonary embolism.  PE study pending at the time of shift change.  Care handed off to Dr. Preston Fleeting pending PE study.  Anticipate admission.  Please see his note for final disposition and decision making.  Patient's case discussed with Dr. Estell Harpin who agrees with plan.   Note: Portions of this report may have been transcribed using voice recognition software. Every effort was made to ensure accuracy; however, inadvertent computerized transcription errors may still be present.         Final Clinical Impression(s) / ED Diagnoses Final diagnoses:  None    Rx / DC Orders ED Discharge Orders     None         Mora Bellman 06/26/23 2314    Bethann Berkshire, MD 06/28/23 1131

## 2023-06-26 NOTE — ED Notes (Signed)
Date and time results received: 06/26/23 2236 (use smartphrase ".now" to insert current time)  Test: troponin Critical Value: 1168  Name of Provider Notified: [place]   Orders Received? Or Actions Taken?: n/a

## 2023-06-26 NOTE — ED Notes (Signed)
Lab at bedside

## 2023-06-26 NOTE — ED Triage Notes (Signed)
Pt reports:  SOB Started Monday Went to UC Given Steroid shot Still feeling bad Cough

## 2023-06-27 ENCOUNTER — Inpatient Hospital Stay (HOSPITAL_COMMUNITY): Payer: 59

## 2023-06-27 ENCOUNTER — Other Ambulatory Visit (HOSPITAL_COMMUNITY): Payer: 59

## 2023-06-27 DIAGNOSIS — A419 Sepsis, unspecified organism: Secondary | ICD-10-CM

## 2023-06-27 DIAGNOSIS — I472 Ventricular tachycardia, unspecified: Secondary | ICD-10-CM | POA: Diagnosis not present

## 2023-06-27 DIAGNOSIS — I214 Non-ST elevation (NSTEMI) myocardial infarction: Secondary | ICD-10-CM | POA: Diagnosis present

## 2023-06-27 DIAGNOSIS — R7989 Other specified abnormal findings of blood chemistry: Secondary | ICD-10-CM | POA: Diagnosis not present

## 2023-06-27 DIAGNOSIS — I5043 Acute on chronic combined systolic (congestive) and diastolic (congestive) heart failure: Secondary | ICD-10-CM | POA: Diagnosis not present

## 2023-06-27 DIAGNOSIS — R0602 Shortness of breath: Secondary | ICD-10-CM | POA: Diagnosis present

## 2023-06-27 DIAGNOSIS — I428 Other cardiomyopathies: Secondary | ICD-10-CM | POA: Diagnosis present

## 2023-06-27 DIAGNOSIS — R0609 Other forms of dyspnea: Secondary | ICD-10-CM

## 2023-06-27 DIAGNOSIS — Z882 Allergy status to sulfonamides status: Secondary | ICD-10-CM | POA: Diagnosis not present

## 2023-06-27 DIAGNOSIS — I5023 Acute on chronic systolic (congestive) heart failure: Secondary | ICD-10-CM | POA: Diagnosis present

## 2023-06-27 DIAGNOSIS — Z79890 Hormone replacement therapy (postmenopausal): Secondary | ICD-10-CM | POA: Diagnosis not present

## 2023-06-27 DIAGNOSIS — B9789 Other viral agents as the cause of diseases classified elsewhere: Secondary | ICD-10-CM | POA: Diagnosis present

## 2023-06-27 DIAGNOSIS — Z720 Tobacco use: Secondary | ICD-10-CM | POA: Insufficient documentation

## 2023-06-27 DIAGNOSIS — I400 Infective myocarditis: Secondary | ICD-10-CM | POA: Diagnosis present

## 2023-06-27 DIAGNOSIS — Z7984 Long term (current) use of oral hypoglycemic drugs: Secondary | ICD-10-CM | POA: Diagnosis not present

## 2023-06-27 DIAGNOSIS — I340 Nonrheumatic mitral (valve) insufficiency: Secondary | ICD-10-CM | POA: Diagnosis present

## 2023-06-27 DIAGNOSIS — I471 Supraventricular tachycardia, unspecified: Secondary | ICD-10-CM | POA: Diagnosis not present

## 2023-06-27 DIAGNOSIS — I272 Pulmonary hypertension, unspecified: Secondary | ICD-10-CM | POA: Diagnosis present

## 2023-06-27 DIAGNOSIS — Z79899 Other long term (current) drug therapy: Secondary | ICD-10-CM | POA: Diagnosis not present

## 2023-06-27 DIAGNOSIS — J189 Pneumonia, unspecified organism: Secondary | ICD-10-CM

## 2023-06-27 DIAGNOSIS — F17210 Nicotine dependence, cigarettes, uncomplicated: Secondary | ICD-10-CM | POA: Diagnosis present

## 2023-06-27 DIAGNOSIS — E874 Mixed disorder of acid-base balance: Secondary | ICD-10-CM | POA: Diagnosis present

## 2023-06-27 DIAGNOSIS — Z7982 Long term (current) use of aspirin: Secondary | ICD-10-CM | POA: Diagnosis not present

## 2023-06-27 DIAGNOSIS — I2510 Atherosclerotic heart disease of native coronary artery without angina pectoris: Secondary | ICD-10-CM | POA: Diagnosis present

## 2023-06-27 DIAGNOSIS — J9601 Acute respiratory failure with hypoxia: Secondary | ICD-10-CM | POA: Diagnosis present

## 2023-06-27 DIAGNOSIS — E785 Hyperlipidemia, unspecified: Secondary | ICD-10-CM | POA: Diagnosis present

## 2023-06-27 LAB — ECHOCARDIOGRAM COMPLETE
AR max vel: 2.17 cm2
AV Area VTI: 1.95 cm2
AV Area mean vel: 1.96 cm2
AV Mean grad: 4 mm[Hg]
AV Peak grad: 6.3 mm[Hg]
Ao pk vel: 1.25 m/s
Area-P 1/2: 5.62 cm2
Height: 63 in
MV M vel: 4.71 m/s
MV Peak grad: 88.7 mm[Hg]
P 1/2 time: 225 ms
Radius: 0.5 cm
S' Lateral: 4.7 cm
Weight: 2240 [oz_av]

## 2023-06-27 LAB — BLOOD GAS, ARTERIAL
Acid-base deficit: 14.4 mmol/L — ABNORMAL HIGH (ref 0.0–2.0)
Bicarbonate: 8.6 mmol/L — ABNORMAL LOW (ref 20.0–28.0)
Drawn by: 37588
FIO2: 36 %
O2 Saturation: 100 %
Patient temperature: 36.1
pCO2 arterial: <18 mm[Hg] — CL (ref 32–48)
pH, Arterial: 7.35 (ref 7.35–7.45)
pO2, Arterial: 143 mm[Hg] — ABNORMAL HIGH (ref 83–108)

## 2023-06-27 LAB — BRAIN NATRIURETIC PEPTIDE: B Natriuretic Peptide: 1741 pg/mL — ABNORMAL HIGH (ref 0.0–100.0)

## 2023-06-27 LAB — HEPARIN LEVEL (UNFRACTIONATED)
Heparin Unfractionated: 0.1 [IU]/mL — ABNORMAL LOW (ref 0.30–0.70)
Heparin Unfractionated: <0.1 [IU]/mL — ABNORMAL LOW (ref 0.30–0.70)

## 2023-06-27 LAB — BASIC METABOLIC PANEL
Anion gap: 19 — ABNORMAL HIGH (ref 5–15)
BUN: 16 mg/dL (ref 6–20)
CO2: 14 mmol/L — ABNORMAL LOW (ref 22–32)
Calcium: 8.2 mg/dL — ABNORMAL LOW (ref 8.9–10.3)
Chloride: 103 mmol/L (ref 98–111)
Creatinine, Ser: 1 mg/dL (ref 0.44–1.00)
GFR, Estimated: >60 mL/min (ref >60)
Glucose, Bld: 130 mg/dL — ABNORMAL HIGH (ref 70–99)
Potassium: 3.6 mmol/L (ref 3.5–5.1)
Sodium: 136 mmol/L (ref 135–145)

## 2023-06-27 LAB — CBC
HCT: 42.9 % (ref 36.0–46.0)
Hemoglobin: 12.6 g/dL (ref 12.0–15.0)
MCH: 29.2 pg (ref 26.0–34.0)
MCHC: 29.4 g/dL — ABNORMAL LOW (ref 30.0–36.0)
MCV: 99.5 fL (ref 80.0–100.0)
Platelets: 135 10*3/uL — ABNORMAL LOW (ref 150–400)
RBC: 4.31 MIL/uL (ref 3.87–5.11)
RDW: 15 % (ref 11.5–15.5)
WBC: 13.9 10*3/uL — ABNORMAL HIGH (ref 4.0–10.5)
nRBC: 0.5 % — ABNORMAL HIGH (ref 0.0–0.2)

## 2023-06-27 LAB — LACTIC ACID, PLASMA
Lactic Acid, Venous: 3.6 mmol/L (ref 0.5–1.9)
Lactic Acid, Venous: 8.2 mmol/L (ref 0.5–1.9)

## 2023-06-27 LAB — MAGNESIUM: Magnesium: 2.2 mg/dL (ref 1.7–2.4)

## 2023-06-27 LAB — TROPONIN I (HIGH SENSITIVITY)
Troponin I (High Sensitivity): 1033 ng/L (ref <18)
Troponin I (High Sensitivity): 1067 ng/L (ref <18)

## 2023-06-27 LAB — PROCALCITONIN: Procalcitonin: 0.12 ng/mL

## 2023-06-27 LAB — APTT: aPTT: 47 s — ABNORMAL HIGH (ref 24–36)

## 2023-06-27 LAB — PROTIME-INR
INR: 1.5 — ABNORMAL HIGH (ref 0.8–1.2)
Prothrombin Time: 18.1 s — ABNORMAL HIGH (ref 11.4–15.2)

## 2023-06-27 MED ORDER — ONDANSETRON HCL 4 MG/2ML IJ SOLN
4.0000 mg | Freq: Once | INTRAMUSCULAR | Status: AC
Start: 2023-06-27 — End: 2023-06-27
  Administered 2023-06-27: 4 mg via INTRAVENOUS
  Filled 2023-06-27: qty 2

## 2023-06-27 MED ORDER — HEPARIN (PORCINE) 25000 UT/250ML-% IV SOLN
1500.0000 [IU]/h | INTRAVENOUS | Status: DC
  Administered 2023-06-27: 1100 [IU]/h via INTRAVENOUS
  Administered 2023-06-27: 750 [IU]/h via INTRAVENOUS
  Administered 2023-06-28: 1450 [IU]/h via INTRAVENOUS
  Administered 2023-06-29: 1500 [IU]/h via INTRAVENOUS
  Filled 2023-06-27 (×4): qty 250

## 2023-06-27 MED ORDER — GUAIFENESIN ER 600 MG PO TB12
600.0000 mg | ORAL_TABLET | Freq: Two times a day (BID) | ORAL | Status: DC
  Administered 2023-06-27 – 2023-07-01 (×9): 600 mg via ORAL
  Filled 2023-06-27 (×9): qty 1

## 2023-06-27 MED ORDER — IPRATROPIUM-ALBUTEROL 0.5-2.5 (3) MG/3ML IN SOLN
3.0000 mL | Freq: Four times a day (QID) | RESPIRATORY_TRACT | Status: DC
  Administered 2023-06-27 – 2023-06-28 (×3): 3 mL via RESPIRATORY_TRACT
  Filled 2023-06-27 (×3): qty 3

## 2023-06-27 MED ORDER — MORPHINE SULFATE (PF) 2 MG/ML IV SOLN: 2.0000 mg | INTRAVENOUS | Status: DC | PRN

## 2023-06-27 MED ORDER — NITROGLYCERIN 0.4 MG SL SUBL: 0.4000 mg | SUBLINGUAL_TABLET | SUBLINGUAL | Status: DC | PRN

## 2023-06-27 MED ORDER — SODIUM CHLORIDE 0.9 % IV SOLN
150.0000 mL/h | INTRAVENOUS | Status: DC
  Administered 2023-06-27: 150 mL/h via INTRAVENOUS

## 2023-06-27 MED ORDER — MORPHINE SULFATE (PF) 4 MG/ML IV SOLN
4.0000 mg | Freq: Once | INTRAVENOUS | Status: AC
  Administered 2023-06-27: 4 mg via INTRAVENOUS
  Filled 2023-06-27: qty 1

## 2023-06-27 MED ORDER — MAGNESIUM HYDROXIDE 400 MG/5ML PO SUSP: 30.0000 mL | Freq: Every day | ORAL | Status: DC | PRN

## 2023-06-27 MED ORDER — SODIUM CHLORIDE 0.9 % IV SOLN: INTRAVENOUS | Status: DC

## 2023-06-27 MED ORDER — LOSARTAN POTASSIUM 25 MG PO TABS
25.0000 mg | ORAL_TABLET | Freq: Every day | ORAL | Status: DC
  Administered 2023-06-27 – 2023-06-28 (×2): 25 mg via ORAL
  Filled 2023-06-27 (×2): qty 1

## 2023-06-27 MED ORDER — SODIUM CHLORIDE 0.9 % IV SOLN
2.0000 g | INTRAVENOUS | Status: DC
  Administered 2023-06-27 – 2023-06-29 (×3): 2 g via INTRAVENOUS
  Filled 2023-06-27 (×3): qty 20

## 2023-06-27 MED ORDER — HEPARIN BOLUS VIA INFUSION
2000.0000 [IU] | Freq: Once | INTRAVENOUS | Status: AC
  Administered 2023-06-27: 2000 [IU] via INTRAVENOUS
  Filled 2023-06-27: qty 2000

## 2023-06-27 MED ORDER — SODIUM BICARBONATE 8.4 % IV SOLN
50.0000 meq | Freq: Once | INTRAVENOUS | Status: AC
  Administered 2023-06-27: 50 meq via INTRAVENOUS
  Filled 2023-06-27: qty 50

## 2023-06-27 MED ORDER — HEPARIN BOLUS VIA INFUSION
3800.0000 [IU] | Freq: Once | INTRAVENOUS | Status: AC
  Administered 2023-06-27: 3800 [IU] via INTRAVENOUS

## 2023-06-27 MED ORDER — HYDROCOD POLI-CHLORPHE POLI ER 10-8 MG/5ML PO SUER
5.0000 mL | Freq: Two times a day (BID) | ORAL | Status: DC | PRN
  Administered 2023-06-27: 5 mL via ORAL
  Filled 2023-06-27 (×2): qty 5

## 2023-06-27 MED ORDER — POLYETHYLENE GLYCOL 3350 17 G PO PACK
17.0000 g | PACK | Freq: Every day | ORAL | Status: DC
  Administered 2023-06-27: 17 g via ORAL
  Filled 2023-06-27 (×3): qty 1

## 2023-06-27 MED ORDER — PERFLUTREN LIPID MICROSPHERE
3.0000 mL | INTRAVENOUS | Status: AC | PRN
  Administered 2023-06-27: 3 mL via INTRAVENOUS

## 2023-06-27 MED ORDER — SODIUM CHLORIDE 0.45 % IV SOLN
INTRAVENOUS | Status: DC
  Filled 2023-06-27: qty 75

## 2023-06-27 MED ORDER — SODIUM CHLORIDE 0.9 % IV SOLN
500.0000 mg | INTRAVENOUS | Status: DC
  Administered 2023-06-27 – 2023-06-29 (×3): 500 mg via INTRAVENOUS
  Filled 2023-06-27 (×3): qty 5

## 2023-06-27 MED ORDER — IPRATROPIUM-ALBUTEROL 0.5-2.5 (3) MG/3ML IN SOLN
3.0000 mL | RESPIRATORY_TRACT | Status: DC | PRN
  Administered 2023-06-28: 3 mL via RESPIRATORY_TRACT
  Filled 2023-06-27: qty 3

## 2023-06-27 MED ORDER — FUROSEMIDE 10 MG/ML IJ SOLN
40.0000 mg | Freq: Two times a day (BID) | INTRAMUSCULAR | Status: DC
  Administered 2023-06-27 – 2023-06-30 (×7): 40 mg via INTRAVENOUS
  Filled 2023-06-27 (×7): qty 4

## 2023-06-27 NOTE — Consult Note (Incomplete)
NAME:  Jennifer King, MRN:  [id number], DOB:  [birth year], LOS: 0 ADMISSION DATE:  06/26/2023, CONSULTATION DATE:  06/27/2023 REFERRING MD:  Dr. Sunnie Nielsen - [facility], CHIEF COMPLAINT:  Shock    History of Present Illness:  Jennifer King is a 52 year old female with no reported past medical history who presented to the ED at [facility] 12/6 for complaints of midsternal pressure-like chest pain with associated dyspnea.  Denied any radiation of pain.  Additionally patient reports URI symptoms 2 weeks prior to admission with associated development of low-grade fever and chills, treated with a Z-Pak, steroids, and additional antibiotic injection at urgent care.  On ED arrival patient was seen tachypneic, tachycardic, and mildly hypertensive.  Lab work on admission significant for high-sensitivity troponin 1168, elevated BNP of 1741, lactic acid with uptrend from 3.6-8.2, WBC 13.9, platelets 135, anion gap 19.  CTA with cardiomegaly and pulmonary edema.  Given concern for decompensated new onset CHF with NSTEMI and elevated lactic acid patient was transferred to ED ED with PCCM consulted on arrival  Pertinent  Medical History  Former smoker  Significant Hospital Events: Including procedures, antibiotic start and stop dates in addition to other pertinent events   12/7 admitted for pressure-like substernal chest pain workup thus far suggest decompensated new onset CHF with NSTEMI  Interim History / Subjective:  As above  Objective   Blood pressure 104/80, pulse (!) 116, temperature 98.4 F (36.9 C), temperature source Rectal, resp. rate (!) 21, height 5\' 3"  (1.6 m), weight 63.5 kg, SpO2 100%.    FiO2 (%):  [30 %] 30 %  No intake or output data in the 24 hours ending 06/27/23 1040 Filed Weights   06/26/23 1753  Weight: 63.5 kg    Examination: General: Chronically ill appearing elderly *** female/female on mechanical ventilation, in NAD HEENT: ETT, MM pink/moist, PERRL,  Neuro: *** CV: s1s2 regular rate  and rhythm, no murmur, rubs, or gallops,  PULM:  *** GI: soft, bowel sounds active in all 4 quadrants, non-tender, non-distended, tolerating TF*** Extremities: warm/dry, *** edema  Skin: no rashes or lesions  Resolved Hospital Problem list   ***  Assessment & Plan:  High suspicion for decompensated new onset CHF versus evolving cardiogenic shock given elevated lactic acid -CTA with cardiomegaly and pulmonary edema, BNP 1741 NSTEMI Elevated troponin -High-sensitivity troponin peaked at 1168 P: Cardiology consult to assess for need for urgent heart cath Continuous telemetry  Trend BMP Trend lactic acid Strict intake and output  Daily weight to assess volume status Daily assessment for need to diurese  Closely monitor renal function and electrolytes  Provide supplemtal oxygen as needed  Obtain echocardiogram Assess need for central access   Mild hyperglycemia P: Trend CBC Initiate SSI as needed  Leukocytosis -No evidence of active infection on workup thus far P: Trend CBC and fever curve Monitor for need to initiate antibiotics   Best Practice (right click and "Reselect all SmartList Selections" daily)   Diet/type: {diet type:25684} DVT prophylaxis:    Pressure ulcer(s): {pressure ulcer(s):31683} GI prophylaxis: {HY:86578} Lines: {Central Venous Access:25771} Foley:  {Central Venous Access:25691} Code Status:  {Code Status:26939} Last date of multidisciplinary goals of care discussion [***]  Labs   CBC: Recent Labs  Lab 06/26/23 2134 06/27/23 0831  WBC 11.4* 13.9*  NEUTROABS 7.8*  --   HGB 12.7 12.6  HCT 39.1 42.9  MCV 90.7 99.5  PLT 155 135*    Basic Metabolic Panel: Recent Labs  Lab 06/26/23 2134 06/27/23 0831  NA 135 136  K 3.6 3.6  CL 105 103  CO2 18* 14*  GLUCOSE 100* 130*  BUN 15 16  CREATININE 0.72 1.00  CALCIUM 8.9 8.2*  MG  --  2.2   GFR: Estimated Creatinine Clearance: 60.4 mL/min (by C-G formula based on SCr of 1  mg/dL). Recent Labs  Lab 06/26/23 2134 06/27/23 0613 06/27/23 0727 06/27/23 0831  PROCALCITON  --   --  0.12  --   WBC 11.4*  --   --  13.9*  LATICACIDVEN  --  3.6*  --  8.2*    Liver Function Tests: No results for input(s): "AST", "ALT", "ALKPHOS", "BILITOT", "PROT", "ALBUMIN" in the last 168 hours. No results for input(s): "LIPASE", "AMYLASE" in the last 168 hours. No results for input(s): "AMMONIA" in the last 168 hours.  ABG    Component Value Date/Time   PHART 7.35 06/27/2023 0806   PCO2ART <18 (LL) 06/27/2023 0806   PO2ART 143 (H) 06/27/2023 0806   HCO3 8.6 (L) 06/27/2023 0806   ACIDBASEDEF 14.4 (H) 06/27/2023 0806   O2SAT 100 06/27/2023 0806     Coagulation Profile: Recent Labs  Lab 06/27/23 0613  INR 1.5*    Cardiac Enzymes: No results for input(s): "CKTOTAL", "CKMB", "CKMBINDEX", "TROPONINI" in the last 168 hours.  HbA1C: No results found for: "HGBA1C"  CBG: No results for input(s): "GLUCAP" in the last 168 hours.  Review of Systems:   ***  Past Medical History:  She,  has no past medical history on file.   Surgical History:  History reviewed. No pertinent surgical history.   Social History:   reports that she has quit smoking. Her smoking use included cigarettes. She has never used smokeless tobacco. She reports that she does not currently use alcohol. She reports that she does not currently use drugs.   Family History:  Her family history includes Healthy in her father and mother.   Allergies Allergies  Allergen Reactions   Sulfa Antibiotics Hives     Home Medications  Prior to Admission medications   Medication Sig Start Date End Date Taking? Authorizing Provider  benzonatate (TESSALON) 100 MG capsule Take 1 capsule (100 mg total) by mouth every 8 (eight) hours. 12/09/19   Moshe Cipro, FNP  cetirizine (ZYRTEC) 5 MG tablet Take 5 mg by mouth daily.    [provider]  cyclobenzaprine (FLEXERIL) 10 MG tablet Take 1 tablet  (10 mg total) by mouth at bedtime. 09/05/20   Avegno, Zachery Dakins, FNP  fluticasone (FLONASE) 50 MCG/ACT nasal spray Place 1 spray into both nostrils daily for 14 days. 09/05/20 09/19/20  Avegno, Zachery Dakins, FNP  guaiFENesin 200 MG tablet Take 1 tablet (200 mg total) by mouth every 4 (four) hours as needed for cough or to loosen phlegm. 12/09/19   Moshe Cipro, FNP  JUNEL 1/20 1-20 MG-MCG tablet 1 TAB(S) ONCE A DAY ORALLY 84 12/09/15   [provider]  predniSONE (STERAPRED UNI-PAK 21 TAB) 10 MG (21) TBPK tablet Take by mouth daily. Take 6 tabs by mouth daily  for 1 days, then 5 tabs for 1 days, then 4 tabs for 1 days, then 3 tabs for 1 days, 2 tabs for 1 days, then 1 tab by mouth daily for 1 days 09/05/20   Durward Parcel, FNP     Critical care time: ***

## 2023-06-27 NOTE — ED Notes (Addendum)
ED to ED Transfer, accepting MD is [place], [facility] to provide transport.

## 2023-06-27 NOTE — Assessment & Plan Note (Signed)
-   Differential diagnosis would include acute myocarditis possibly viral. - The patient will be admitted to an [facility] PCU bed. - We will continue IV heparin. - Should be given as needed IV morphine sulfate and sublingual nitroglycerin for pain - Will be placed on aspirin and high-dose statin therapy. - 2D echo and cardiology consult will be obtained. - I notified Dr. Hyacinth Meeker about the patient.

## 2023-06-27 NOTE — H&P (Signed)
[place]   PATIENT NAME: Jennifer King    MR#:  [id number]  DATE OF BIRTH:  [birth year]  DATE OF ADMISSION:  06/26/2023  PRIMARY CARE PHYSICIAN: Patient, No Pcp Per   Patient is coming from: Home  REQUESTING/REFERRING PHYSICIAN: Dione Booze, MD  CHIEF COMPLAINT:   Chief Complaint  Patient presents with   Shortness of Breath   Cough    HISTORY OF PRESENT ILLNESS:  Ashara Portz is a 52 y.o. Caucasian female with  medical history significant for tobacco abuse status post quitting 3 weeks ago, who presented to the emergency room with acute onset of midsternal chest pain felt like somebody sitting on her chest with associated dyspnea without palpitations, nausea or vomiting or diaphoresis.  She denies any radiation of her pain.  2 weeks ago she started having nasal and sinus congestion with associated cough, mild sore throat and earache.  She admitted to low-grade fever and chills them.  [facility] and [facility] and was later seen again at [facility] medical and was given steroid and antibiotic injection per her report.  She denies any leg pain or edema or recent travels or surgeries.  No dysuria, oliguria or hematuria or flank pain.  No abdominal pain or melena or bright red bleeding per rectum.  No other bleeding diathesis.  ED Course: When she came to the ER, BP was 129/94 with heart rate of 125 and otherwise normal vital signs.  Labs revealed high sensitive troponin I of 1168 and later 1033 and BNP was remarkable for CO2 of 18 and CBC remarkable for WBCs of 11.4 with neutrophilia.  Respiratory panel came back negative.  D-dimer was elevated at 4.55 EKG as reviewed by me : EKG showed sinus tachycardia with rate of 123 with right axis deviation, poor R wave progression and Q waves inferiorly. Imaging: Two-view chest x-ray showed cardiomegaly with slight central congestion and possible tiny right pleural effusion and mild airspace disease at the right base, atelectasis  versus pneumonia. Chest CTA revealed no evidence for PE.  It showed cardiomegaly with moderate right pleural effusion and diffuse hazy pulmonary density with mild septal thickening, findings are suggestive of pulmonary edema with minimal groundglass density in the right lower lobe, possible edema or infection and mildly enlarged mediastinal lymph node likely reactive.  The patient will be placed on IV Rocephin and Zithromax and will obtain blood cultures.  Contact was made with [facility] cardiology fellow Dr. Julien Girt and she is aware about the patient.  She was ordered IV heparin in addition to aspirin and high-dose statin therapy.  The patient will be admitted to [facility] progressive unit bed for further evaluation and management.   PAST MEDICAL HISTORY:   No chronic medical problems other than history of tobacco abuse.  PAST SURGICAL HISTORY:  History reviewed. No pertinent surgical history. None.  SOCIAL HISTORY:   Social History   Tobacco Use   Smoking status: Former    Current packs/day: 0.50    Types: Cigarettes   Smokeless tobacco: Never  Substance Use Topics   Alcohol use: Not Currently    FAMILY HISTORY:   Family History  Problem Relation Age of Onset   Healthy Mother    Healthy Father     DRUG ALLERGIES:   Allergies  Allergen Reactions   Sulfa Antibiotics Hives    REVIEW OF SYSTEMS:   ROS As per history of present illness. All pertinent systems were reviewed above. Constitutional, HEENT,  cardiovascular, respiratory, GI, GU, musculoskeletal, neuro, psychiatric, endocrine, integumentary and hematologic systems were reviewed and are otherwise negative/unremarkable except for positive findings mentioned above in the HPI.   MEDICATIONS AT HOME:   Prior to Admission medications   Medication Sig Start Date End Date Taking? Authorizing Provider  benzonatate (TESSALON) 100 MG capsule Take 1 capsule (100 mg total) by mouth every 8 (eight) hours. 12/09/19   Moshe Cipro,  FNP  cetirizine (ZYRTEC) 5 MG tablet Take 5 mg by mouth daily.    [provider]  cyclobenzaprine (FLEXERIL) 10 MG tablet Take 1 tablet (10 mg total) by mouth at bedtime. 09/05/20   Avegno, Zachery Dakins, FNP  fluticasone (FLONASE) 50 MCG/ACT nasal spray Place 1 spray into both nostrils daily for 14 days. 09/05/20 09/19/20  Avegno, Zachery Dakins, FNP  guaiFENesin 200 MG tablet Take 1 tablet (200 mg total) by mouth every 4 (four) hours as needed for cough or to loosen phlegm. 12/09/19   Moshe Cipro, FNP  JUNEL 1/20 1-20 MG-MCG tablet 1 TAB(S) ONCE A DAY ORALLY 84 12/09/15   [provider]  predniSONE (STERAPRED UNI-PAK 21 TAB) 10 MG (21) TBPK tablet Take by mouth daily. Take 6 tabs by mouth daily  for 1 days, then 5 tabs for 1 days, then 4 tabs for 1 days, then 3 tabs for 1 days, 2 tabs for 1 days, then 1 tab by mouth daily for 1 days 09/05/20   Durward Parcel, FNP      VITAL SIGNS:  Blood pressure (!) 130/94, pulse (!) 112, temperature 98.9 F (37.2 C), resp. rate (!) 36, height 5\' 3"  (1.6 m), weight 63.5 kg, SpO2 96%.  PHYSICAL EXAMINATION:  Physical Exam  GENERAL:  52 y.o.-year-old Caucasian female patient lying in the bed with no acute distress.  EYES: Pupils equal, round, reactive to light and accommodation. No scleral icterus. Extraocular muscles intact.  HEENT: Head atraumatic, normocephalic. Oropharynx and nasopharynx clear.  NECK:  Supple, no jugular venous distention. No thyroid enlargement, no tenderness.  LUNGS: Diminished right basal breath sounds with right basal crackles. No use of accessory muscles of respiration.  CARDIOVASCULAR: Regular rate and rhythm, S1, S2 normal. No murmurs, rubs, or gallops.  ABDOMEN: Soft, nondistended, nontender. Bowel sounds present. No organomegaly or mass.  EXTREMITIES: No pedal edema, cyanosis, or clubbing.  NEUROLOGIC: Cranial nerves II through XII are intact. Muscle strength 5/5 in all extremities. Sensation intact. Gait not  checked.  PSYCHIATRIC: The patient is alert and oriented x 3.  Normal affect and good eye contact. SKIN: No obvious rash, lesion, or ulcer.   LABORATORY PANEL:   CBC Recent Labs  Lab 06/26/23 2134  WBC 11.4*  HGB 12.7  HCT 39.1  PLT 155   ------------------------------------------------------------------------------------------------------------------  Chemistries  Recent Labs  Lab 06/26/23 2134  NA 135  K 3.6  CL 105  CO2 18*  GLUCOSE 100*  BUN 15  CREATININE 0.72  CALCIUM 8.9   ------------------------------------------------------------------------------------------------------------------  Cardiac Enzymes No results for input(s): "TROPONINI" in the last 168 hours. ------------------------------------------------------------------------------------------------------------------  RADIOLOGY:  CT Angio Chest PE W/Cm &/Or Wo Cm  Result Date: 06/26/2023 CLINICAL DATA:  Shortness of breath with cough EXAM: CT ANGIOGRAPHY CHEST WITH CONTRAST TECHNIQUE: Multidetector CT imaging of the chest was performed using the standard protocol during bolus administration of intravenous contrast. Multiplanar CT image reconstructions and MIPs were obtained to evaluate the vascular anatomy. RADIATION DOSE REDUCTION: This exam was performed according to the departmental dose-optimization program which includes automated exposure  control, adjustment of the mA and/or kV according to patient size and/or use of iterative reconstruction technique. CONTRAST:  75mL OMNIPAQUE IOHEXOL 350 MG/ML SOLN COMPARISON:  Chest x-ray 06/26/2023 FINDINGS: Cardiovascular: Satisfactory opacification of the pulmonary arteries to the segmental level. No evidence of pulmonary embolism. Nonaneurysmal aorta. Cardiomegaly. No pericardial effusion Mediastinum/Nodes: Patent trachea. No thyroid mass. Multiple mildly enlarged mediastinal lymph nodes. Right upper paratracheal node measures 10 mm. Prevascular lymph nodes measure up  to 13 mm. Low right paratracheal nodes up to 10 mm. Esophagus within normal limits Lungs/Pleura: Moderate right pleural effusion. Diffuse hazy pulmonary density and mild diffuse septal thickening. Minimal ground-glass density in the right lower lobe. Upper Abdomen: No acute finding Musculoskeletal: No chest wall abnormality. No acute or significant osseous findings. Review of the MIP images confirms the above findings. IMPRESSION: 1. Negative for acute pulmonary embolus. 2. Cardiomegaly with moderate right pleural effusion and diffuse hazy pulmonary density and mild septal thickening, findings are suggestive of pulmonary edema. Minimal ground-glass density in the right lower lobe, possible edema or infection. 3. Mildly enlarged mediastinal lymph nodes, likely reactive. Electronically Signed   By: Jasmine Pang M.D.   On: 06/26/2023 23:50   DG Chest 2 View  Result Date: 06/26/2023 CLINICAL DATA:  Shortness of breath EXAM: CHEST - 2 VIEW COMPARISON:  06/04/2018 FINDINGS: Cardiomegaly with slight central congestion. No mild airspace disease at the right base. Possible tiny right effusion. No pneumothorax IMPRESSION: Cardiomegaly with slight central congestion and possible tiny right effusion. Mild airspace disease at right base, atelectasis versus pneumonia. Electronically Signed   By: Jasmine Pang M.D.   On: 06/26/2023 19:30      IMPRESSION AND PLAN:  Assessment and Plan: * NSTEMI (non-ST elevated myocardial infarction) (HCC) - Differential diagnosis would include acute myocarditis possibly viral. - The patient will be admitted to an [facility] PCU bed. - We will continue IV heparin. - Should be given as needed IV morphine sulfate and sublingual nitroglycerin for pain - Will be placed on aspirin and high-dose statin therapy. - 2D echo and cardiology consult will be obtained. - I notified Dr. Hyacinth Meeker about the patient.  Sepsis due to pneumonia (HCC) - Sepsis is manifested by tachycardia and tachypnea. -  Her pneumonia is likely right basal with parapneumonic effusion. - The patient will be placed on IV Rocephin and Zithromax. - Mucolytic therapy and bronchodilator therapy will be provided. - We will follow-up cultures. - We will continue addition with IV normal saline.  Tobacco abuse - She quit smoking cigarettes 3 weeks ago. - She was encouraged to stay away from cigarettes.   DVT prophylaxis: IV Heparin Advanced Care Planning:  Code Status: full code.  Family Communication:  The plan of care was discussed in details with the patient (and family). I answered all questions. The patient agreed to proceed with the above mentioned plan. Further management will depend upon hospital course. Disposition Plan: Back to previous home environment Consults called: Cardiology  All the records are reviewed and case discussed with ED provider.  Status is: Inpatient  At the time of the admission, it appears that the appropriate admission status for this patient is inpatient.  This is judged to be reasonable and necessary in order to provide the required intensity of service to ensure the patient's safety given the presenting symptoms, physical exam findings and initial radiographic and laboratory data in the context of comorbid conditions.  The patient requires inpatient status due to high intensity of service, high risk of  further deterioration and high frequency of surveillance required.  I certify that at the time of admission, it is my clinical judgment that the patient will require inpatient hospital care extending more than 2 midnights.                            Dispo: The patient is from: Home              Anticipated d/c is to: Home              Patient currently is not medically stable to d/c.              Difficult to place patient: No  Hannah Beat M.D on 06/27/2023 at 5:25 AM  [facility]   From 7 PM-7 AM, contact night-coverage [email or web address]  CC: Primary care physician;  Patient, No Pcp Per Previous surgeries.  No previous surgeriesCaucasian female diminished right basal breath sounds with right basal crackles.

## 2023-06-27 NOTE — Progress Notes (Signed)
PHARMACY - ANTICOAGULATION CONSULT NOTE  Pharmacy Consult for heparin Indication: ACS  Allergies  Allergen Reactions   Sulfa Antibiotics Hives    Patient Measurements: Height: 5\' 3"  (160 cm) Weight: 63.5 kg (140 lb) IBW/kg (Calculated) : 52.4 Heparin Dosing Weight: 63.5 kg  Vital Signs: Temp: 98.3 F (36.8 C) (12/06 1753) Temp Source: Oral (12/06 1753) BP: 122/84 (12/06 2341) Pulse Rate: 114 (12/06 2341)  Labs: Recent Labs    06/26/23 2134 06/26/23 2333  HGB 12.7  --   HCT 39.1  --   PLT 155  --   CREATININE 0.72  --   TROPONINIHS 1,168* 1,033*    Estimated Creatinine Clearance: 75.4 mL/min (by C-G formula based on SCr of 0.72 mg/dL).  Assessment: 54 yoF presented to ED with SOB and cough. Pharmacy consulted to dose heparin for pulmonary embolism and ACS.  -CBC stable -Troponins 1168 > 1033 -no PTA anticoagulation -CTA: negative for acute PE, will continue heparin for ACS  Goal of Therapy:  Heparin level 0.3-0.7 units/ml Monitor platelets by anticoagulation protocol: Yes   Plan:  Give 3800 units bolus x 1 Start heparin infusion at 750 units/hr Check anti-Xa level in 6 hours and daily while on heparin Continue to monitor H&H and platelets  Arabella Merles, PharmD. Clinical Pharmacist 06/27/2023 1:14 AM

## 2023-06-27 NOTE — Assessment & Plan Note (Signed)
-   She quit smoking cigarettes 3 weeks ago. - She was encouraged to stay away from cigarettes.

## 2023-06-27 NOTE — Plan of Care (Signed)
  Problem: Fluid Volume: Goal: Hemodynamic stability will improve Outcome: Progressing   Problem: Clinical Measurements: Goal: Diagnostic test results will improve Outcome: Progressing   Problem: Respiratory: Goal: Ability to maintain adequate ventilation will improve Outcome: Progressing   Problem: Education: Goal: Knowledge of General Education information will improve Description: Including pain rating scale, medication(s)/side effects and non-pharmacologic comfort measures Outcome: Progressing   Problem: Clinical Measurements: Goal: Cardiovascular complication will be avoided Outcome: Progressing   Problem: Activity: Goal: Risk for activity intolerance will decrease Outcome: Progressing

## 2023-06-27 NOTE — ED Notes (Signed)
ED TO INPATIENT HANDOFF REPORT  ED Nurse Name and Phone #: Murlean Iba Paramedic [phone]  S Name/Age/Gender Jennifer King 52 y.o. female Room/Bed: 034C/034C  Code Status   Code Status: Not on file  Home/SNF/Other Home Patient oriented to: self, place, time, and situation Is this baseline? Yes   Triage Complete: Triage complete  Chief Complaint NSTEMI (non-ST elevated myocardial infarction) [facility]) [I21.4]  Triage Note Pt reports:  SOB Started Monday Went to [facility] Given Steroid shot Still feeling bad Cough    Allergies Allergies  Allergen Reactions   Sulfa Antibiotics Hives    Level of Care/Admitting Diagnosis ED Disposition     ED Disposition  Admit   Condition  --   Comment  Hospital Area: [facility] [[id number]]  Level of Care: Progressive [102]  Admit to Progressive based on following criteria: CARDIOVASCULAR & THORACIC of moderate stability with acute coronary syndrome symptoms/low risk myocardial infarction/hypertensive urgency/arrhythmias/heart failure potentially compromising stability and stable post cardiovascular intervention patients.  May admit patient to [facility] or [facility] if equivalent level of care is available:: No  Covid Evaluation: Asymptomatic - no recent exposure (last 10 days) testing not required  Diagnosis: NSTEMI (non-ST elevated myocardial infarction) [facility]) [562130]  Admitting Physician: Hannah Beat [[id number]]  Attending Physician: Hannah Beat [[id number]]  Certification:: I certify this patient will need inpatient services for at least 2 midnights  Expected Medical Readiness: 06/29/2023          B Medical/Surgery History History reviewed. No pertinent past medical history. History reviewed. No pertinent surgical history.   A IV Location/Drains/Wounds Patient Lines/Drains/Airways Status     Active Line/Drains/Airways     Name Placement date Placement time Site Days   Peripheral IV 06/26/23 20 G 1.88"  Anterior;Right Forearm 06/26/23  2256  Forearm  1   Peripheral IV 06/27/23 20 G Anterior;Right Forearm 06/27/23  0608  Forearm  less than 1            Intake/Output Last 24 hours  Intake/Output Summary (Last 24 hours) at 06/27/2023 1327 Last data filed at 06/27/2023 1104 Gross per 24 hour  Intake 108.67 ml  Output --  Net 108.67 ml    Labs/Imaging Results for orders placed or performed during the hospital encounter of 06/26/23 (from the past 48 hour(s))  Resp panel by RT-PCR (RSV, Flu A&B, Covid) Anterior Nasal Swab     Status: None   Collection Time: 06/26/23  8:21 PM   Specimen: Anterior Nasal Swab  Result Value Ref Range   SARS Coronavirus 2 by RT PCR NEGATIVE NEGATIVE    Comment: (NOTE) SARS-CoV-2 target nucleic acids are NOT DETECTED.  The SARS-CoV-2 RNA is generally detectable in upper respiratory specimens during the acute phase of infection. The lowest concentration of SARS-CoV-2 viral copies this assay can detect is 138 copies/mL. A negative result does not preclude SARS-Cov-2 infection and should not be used as the sole basis for treatment or other patient management decisions. A negative result may occur with  improper specimen collection/handling, submission of specimen other than nasopharyngeal swab, presence of viral mutation(s) within the areas targeted by this assay, and inadequate number of viral copies(<138 copies/mL). A negative result must be combined with clinical observations, patient history, and epidemiological information. The expected result is Negative.  Fact Sheet for Patients:  BloggerCourse.com  Fact Sheet for Healthcare Providers:  SeriousBroker.it  This test is no t yet approved or cleared by the [place] FDA and  has been  authorized for detection and/or diagnosis of SARS-CoV-2 by FDA under an Emergency Use Authorization (EUA). This EUA will remain  in effect (meaning this test can  be used) for the duration of the COVID-19 declaration under Section 564(b)(1) of the Act, 21 U.S.C.section 360bbb-3(b)(1), unless the authorization is terminated  or revoked sooner.       Influenza A by PCR NEGATIVE NEGATIVE   Influenza B by PCR NEGATIVE NEGATIVE    Comment: (NOTE) The Xpert Xpress SARS-CoV-2/FLU/RSV plus assay is intended as an aid in the diagnosis of influenza from Nasopharyngeal swab specimens and should not be used as a sole basis for treatment. Nasal washings and aspirates are unacceptable for Xpert Xpress SARS-CoV-2/FLU/RSV testing.  Fact Sheet for Patients: BloggerCourse.com  Fact Sheet for Healthcare Providers: SeriousBroker.it  This test is not yet approved or cleared by the [place] FDA and has been authorized for detection and/or diagnosis of SARS-CoV-2 by FDA under an Emergency Use Authorization (EUA). This EUA will remain in effect (meaning this test can be used) for the duration of the COVID-19 declaration under Section 564(b)(1) of the Act, 21 U.S.C. section 360bbb-3(b)(1), unless the authorization is terminated or revoked.     Resp Syncytial Virus by PCR NEGATIVE NEGATIVE    Comment: (NOTE) Fact Sheet for Patients: BloggerCourse.com  Fact Sheet for Healthcare Providers: SeriousBroker.it  This test is not yet approved or cleared by the [place] FDA and has been authorized for detection and/or diagnosis of SARS-CoV-2 by FDA under an Emergency Use Authorization (EUA). This EUA will remain in effect (meaning this test can be used) for the duration of the COVID-19 declaration under Section 564(b)(1) of the Act, 21 U.S.C. section 360bbb-3(b)(1), unless the authorization is terminated or revoked.  Performed at [facility], [address] [place], [place] [address]   Basic metabolic panel     Status: Abnormal   Collection Time:  06/26/23  9:34 PM  Result Value Ref Range   Sodium 135 135 - 145 mmol/L   Potassium 3.6 3.5 - 5.1 mmol/L   Chloride 105 98 - 111 mmol/L   CO2 18 (L) 22 - 32 mmol/L   Glucose, Bld 100 (H) 70 - 99 mg/dL    Comment: Glucose reference range applies only to samples taken after fasting for at least 8 hours.   BUN 15 6 - 20 mg/dL   Creatinine, Ser 4.74 0.44 - 1.00 mg/dL   Calcium 8.9 8.9 - 25.9 mg/dL   GFR, Estimated >56 >38 mL/min    Comment: (NOTE) Calculated using the CKD-EPI Creatinine Equation (2021)    Anion gap 12 5 - 15    Comment: Performed at [facility], [address] [place], [place] [address]  CBC with Differential     Status: Abnormal   Collection Time: 06/26/23  9:34 PM  Result Value Ref Range   WBC 11.4 (H) 4.0 - 10.5 K/uL   RBC 4.31 3.87 - 5.11 MIL/uL   Hemoglobin 12.7 12.0 - 15.0 g/dL   HCT 32.9 51.8 - 84.1 %   MCV 90.7 80.0 - 100.0 fL   MCH 29.5 26.0 - 34.0 pg   MCHC 32.5 30.0 - 36.0 g/dL   RDW 66.0 63.0 - 16.0 %   Platelets 155 150 - 400 K/uL   nRBC 0.4 (H) 0.0 - 0.2 %   Neutrophils Relative % 69 %   Neutro Abs 7.8 (H) 1.7 - 7.7 K/uL   Lymphocytes Relative 25 %   Lymphs Abs 2.8 0.7 -  4.0 K/uL   Monocytes Relative 6 %   Monocytes Absolute 0.7 0.1 - 1.0 K/uL   Eosinophils Relative 0 %   Eosinophils Absolute 0.1 0.0 - 0.5 K/uL   Basophils Relative 0 %   Basophils Absolute 0.0 0.0 - 0.1 K/uL   Immature Granulocytes 0 %   Abs Immature Granulocytes 0.03 0.00 - 0.07 K/uL    Comment: Performed at [facility], [address], [place], [place] [address]  D-dimer, quantitative     Status: Abnormal   Collection Time: 06/26/23  9:34 PM  Result Value Ref Range   D-Dimer, Quant 4.55 (H) 0.00 - 0.50 ug/mL-FEU    Comment: (NOTE) At the manufacturer cut-off value of 0.5 g/mL FEU, this assay has a negative predictive value of 95-100%.This assay is intended for use in conjunction with a clinical pretest probability (PTP) assessment model to exclude pulmonary embolism  (PE) and deep venous thrombosis (DVT) in outpatients suspected of PE or DVT. Results should be correlated with clinical presentation. Performed at [facility], [address] [place], [place] [address]   Troponin I (High Sensitivity)     Status: Abnormal   Collection Time: 06/26/23  9:34 PM  Result Value Ref Range   Troponin I (High Sensitivity) 1,168 (HH) <18 ng/L    Comment: CRITICAL RESULT CALLED TO, READ BACK BY AND VERIFIED WITH CLINTON,A ON 06/26/23 AT 2235 BY LOY,C (NOTE) Elevated high sensitivity troponin I (hsTnI) values and significant  changes across serial measurements may suggest ACS but many other  chronic and acute conditions are known to elevate hsTnI results.  Refer to the "Links" section for chest pain algorithms and additional  guidance. Performed at [facility], [address], [place], [place] [address]   Troponin I (High Sensitivity)     Status: Abnormal   Collection Time: 06/26/23 11:33 PM  Result Value Ref Range   Troponin I (High Sensitivity) 1,033 (HH) <18 ng/L    Comment: CRITICAL RESULT CALLED TO, READ BACK BY AND VERIFIED WITH CHILTON,A @ 0025 ON 06/27/23 BY JUW (NOTE) Elevated high sensitivity troponin I (hsTnI) values and significant  changes across serial measurements may suggest ACS but many other  chronic and acute conditions are known to elevate hsTnI results.  Refer to the "Links" section for chest pain algorithms and additional  guidance. Performed at [facility], [address], [place], [place] [address]   Troponin I (High Sensitivity)     Status: Abnormal   Collection Time: 06/27/23  5:24 AM  Result Value Ref Range   Troponin I (High Sensitivity) 1,067 (HH) <18 ng/L    Comment: CRITICAL RESULT CALLED TO, READ BACK BY AND VERIFIED WITH ANGIE @ 0622 ON [id number] BY HENDERSON  L DELTA CHECK NOTED (NOTE) Elevated high sensitivity troponin I (hsTnI) values and significant  changes across serial measurements may suggest ACS but many other  chronic  and acute conditions are known to elevate hsTnI results.  Refer to the "Links" section for chest pain algorithms and additional  guidance. Performed at [facility], [address], [place], [place] [address]   Heparin level (unfractionated)     Status: Abnormal   Collection Time: 06/27/23  6:13 AM  Result Value Ref Range   Heparin Unfractionated 0.10 (L) 0.30 - 0.70 IU/mL    Comment: (NOTE) The clinical reportable range upper limit is being lowered to >1.10 to align with the FDA approved guidance for the current laboratory assay.  If heparin results are below expected values, and patient  dosage has  been confirmed, suggest follow up testing of antithrombin III levels. Performed at [facility], [address] [place], [place] [address]   Culture, blood (x 2)     Status: None (Preliminary result)   Collection Time: 06/27/23  6:13 AM   Specimen: Right Antecubital; Blood  Result Value Ref Range   Specimen Description      RIGHT ANTECUBITAL BOTTLES DRAWN AEROBIC AND ANAEROBIC   Special Requests      Blood Culture adequate volume Performed at [facility], [address], [place], [place] [address]    Culture PENDING    Report Status PENDING   Culture, blood (x 2)     Status: None (Preliminary result)   Collection Time: 06/27/23  6:13 AM   Specimen: Left Antecubital; Blood  Result Value Ref Range   Specimen Description      LEFT ANTECUBITAL BOTTLES DRAWN AEROBIC AND ANAEROBIC   Special Requests      Blood Culture adequate volume Performed at [facility], [address], [place], [place] [address]    Culture PENDING    Report Status PENDING   Lactic acid, plasma     Status: Abnormal   Collection Time: 06/27/23  6:13 AM  Result Value Ref Range   Lactic Acid, Venous 3.6 (HH) 0.5 - 1.9 mmol/L    Comment: CRITICAL RESULT CALLED TO, READ BACK BY AND VERIFIED WITH ANGIE @ 0639 ON [id number] BY Orson Aloe L Performed at [facility], [address], [place], [place] [address]    Protime-INR     Status: Abnormal   Collection Time: 06/27/23  6:13 AM  Result Value Ref Range   Prothrombin Time 18.1 (H) 11.4 - 15.2 seconds   INR 1.5 (H) 0.8 - 1.2    Comment: (NOTE) INR goal varies based on device and disease states. Performed at [facility], [address], [place], [place] [address]   APTT     Status: Abnormal   Collection Time: 06/27/23  6:13 AM  Result Value Ref Range   aPTT 47 (H) 24 - 36 seconds    Comment:        IF BASELINE aPTT IS ELEVATED, SUGGEST PATIENT RISK ASSESSMENT BE USED TO DETERMINE APPROPRIATE ANTICOAGULANT THERAPY. Performed at [facility], [address], [place], [place] [address]   Procalcitonin     Status: None   Collection Time: 06/27/23  7:27 AM  Result Value Ref Range   Procalcitonin 0.12 ng/mL    Comment:        Interpretation: PCT (Procalcitonin) <= 0.5 ng/mL: Systemic infection (sepsis) is not likely. Local bacterial infection is possible. (NOTE)       Sepsis PCT Algorithm           Lower Respiratory Tract                                      Infection PCT Algorithm    ----------------------------     ----------------------------         PCT < 0.25 ng/mL                PCT < 0.10 ng/mL          Strongly encourage             Strongly discourage   discontinuation of antibiotics    initiation of antibiotics    ----------------------------     -----------------------------  PCT 0.25 - 0.50 ng/mL            PCT 0.10 - 0.25 ng/mL               OR       >80% decrease in PCT            Discourage initiation of                                            antibiotics      Encourage discontinuation           of antibiotics    ----------------------------     -----------------------------         PCT >= 0.50 ng/mL              PCT 0.26 - 0.50 ng/mL               AND        <80% decrease in PCT             Encourage initiation of                                             antibiotics       Encourage continuation            of antibiotics    ----------------------------     -----------------------------        PCT >= 0.50 ng/mL                  PCT > 0.50 ng/mL               AND         increase in PCT                  Strongly encourage                                      initiation of antibiotics    Strongly encourage escalation           of antibiotics                                     -----------------------------                                           PCT <= 0.25 ng/mL                                                 OR                                        > 80% decrease in PCT  Discontinue / Do not initiate                                             antibiotics  Performed at [facility], [address] [place], [place] [address]   Blood gas, arterial     Status: Abnormal   Collection Time: 06/27/23  8:06 AM  Result Value Ref Range   FIO2 36 %   pH, Arterial 7.35 7.35 - 7.45   pCO2 arterial <18 (LL) 32 - 48 mmHg    Comment: CRITICAL RESULT CALLED TO, READ BACK BY AND VERIFIED WITH: FLETCHER A @ 0854 ON [id number] BY HENDERSON L    pO2, Arterial 143 (H) 83 - 108 mmHg   Bicarbonate 8.6 (L) 20.0 - 28.0 mmol/L   Acid-base deficit 14.4 (H) 0.0 - 2.0 mmol/L   O2 Saturation 100 %   Patient temperature 36.1    Collection site RIGHT RADIAL    Drawn by [id number]    Allens test (pass/fail) PASS PASS    Comment: Performed at [facility], [address], [place], [place] [address]  Lactic acid, plasma     Status: Abnormal   Collection Time: 06/27/23  8:31 AM  Result Value Ref Range   Lactic Acid, Venous 8.2 (HH) 0.5 - 1.9 mmol/L    Comment: CRITICAL RESULT CALLED TO, READ BACK BY AND VERIFIED WITH DOSS M @ 0904 ON [id number] BY HENDERSON L Performed at [facility], [address], [place], [place] [address]   CBC     Status: Abnormal   Collection Time: 06/27/23  8:31 AM  Result Value Ref Range   WBC 13.9 (H) 4.0 - 10.5 K/uL   RBC 4.31 3.87 - 5.11 MIL/uL    Hemoglobin 12.6 12.0 - 15.0 g/dL   HCT 40.3 47.4 - 25.9 %   MCV 99.5 80.0 - 100.0 fL    Comment: DELTA CHECK NOTED   MCH 29.2 26.0 - 34.0 pg   MCHC 29.4 (L) 30.0 - 36.0 g/dL   RDW 56.3 87.5 - 64.3 %   Platelets 135 (L) 150 - 400 K/uL   nRBC 0.5 (H) 0.0 - 0.2 %    Comment: Performed at [facility], [address] [place], [place] [address]  Basic metabolic panel     Status: Abnormal   Collection Time: 06/27/23  8:31 AM  Result Value Ref Range   Sodium 136 135 - 145 mmol/L   Potassium 3.6 3.5 - 5.1 mmol/L   Chloride 103 98 - 111 mmol/L   CO2 14 (L) 22 - 32 mmol/L   Glucose, Bld 130 (H) 70 - 99 mg/dL    Comment: Glucose reference range applies only to samples taken after fasting for at least 8 hours.   BUN 16 6 - 20 mg/dL   Creatinine, Ser 8.84 0.44 - 1.00 mg/dL   Calcium 8.2 (L) 8.9 - 10.3 mg/dL   GFR, Estimated >16 >60 mL/min    Comment: (NOTE) Calculated using the CKD-EPI Creatinine Equation (2021)    Anion gap 19 (H) 5 - 15    Comment: Performed at [facility], [address] [place], [place] [address]  Magnesium     Status: None   Collection Time: 06/27/23  8:31 AM  Result Value Ref Range   Magnesium 2.2 1.7 - 2.4 mg/dL    Comment: Performed at [facility], [address], [place], [place] [address]  Brain natriuretic peptide     Status: Abnormal   Collection Time: 06/27/23  8:31 AM  Result Value Ref Range   B Natriuretic Peptide 1,741.0 (H) 0.0 - 100.0 pg/mL    Comment: Performed at [facility], [address] [place], [place] [address]   ECHOCARDIOGRAM COMPLETE  Result Date: 06/27/2023    ECHOCARDIOGRAM REPORT   Patient Name:   Jennifer King Date of Exam: 06/27/2023 Medical Rec #:  [id number]    Height:       63.0 in Accession #:    [id number]   Weight:       140.0 lb Date of Birth:  04-18-1971    BSA:          1.662 m Patient Age:    52 years     BP:           115/76 mmHg Patient Gender: F            HR:           116 bpm. Exam Location:  Inpatient Procedure: 2D Echo,  Color Doppler, Cardiac Doppler and Intracardiac            Opacification Agent STAT ECHO REPORT CONTAINS CRITICAL RESULT Indications:    Dyspnea  History:        Patient has no prior history of Echocardiogram examinations.                 NSTEMI, Sepsis; Risk Factors:Current Smoker.  Sonographer:    Milbert Coulter Referring Phys: [id number] BELKYS A REGALADO IMPRESSIONS  1. Left ventricular ejection fraction, by estimation, is 25 to 30%. The left ventricle has severely decreased function. The left ventricle demonstrates global hypokinesis. The left ventricular internal cavity size was moderately dilated. Left ventricular diastolic parameters are consistent with Grade II diastolic dysfunction (pseudonormalization).  2. Right ventricular systolic function is normal. The right ventricular size is normal.  3. Left atrial size was moderately dilated.  4. The mitral valve is normal in structure. Moderate to severe mitral valve regurgitation. No evidence of mitral stenosis.  5. Tricuspid valve regurgitation is moderate.  6. The aortic valve is tricuspid. There is mild calcification of the aortic valve. There is mild thickening of the aortic valve. Aortic valve regurgitation is moderate. Aortic valve sclerosis is present, with no evidence of aortic valve stenosis.  7. The inferior vena cava is dilated in size with >50% respiratory variability, suggesting right atrial pressure of 8 mmHg. FINDINGS  Left Ventricle: Left ventricular ejection fraction, by estimation, is 25 to 30%. The left ventricle has severely decreased function. The left ventricle demonstrates global hypokinesis. Definity contrast agent was given IV to delineate the left ventricular endocardial borders. The left ventricular internal cavity size was moderately dilated. There is no left ventricular hypertrophy. Left ventricular diastolic parameters are consistent with Grade II diastolic dysfunction (pseudonormalization). Right Ventricle: The right ventricular size is  normal. No increase in right ventricular wall thickness. Right ventricular systolic function is normal. Left Atrium: Left atrial size was moderately dilated. Right Atrium: Right atrial size was normal in size. Pericardium: There is no evidence of pericardial effusion. Mitral Valve: The mitral valve is normal in structure. Moderate to severe mitral valve regurgitation. No evidence of mitral valve stenosis. Tricuspid Valve: The tricuspid valve is normal in structure. Tricuspid valve regurgitation is moderate . No evidence of tricuspid stenosis. Aortic Valve: The aortic valve is tricuspid. There is mild calcification of the aortic valve.  There is mild thickening of the aortic valve. Aortic valve regurgitation is moderate. Aortic regurgitation PHT measures 225 msec. Aortic valve sclerosis is present, with no evidence of aortic valve stenosis. Aortic valve mean gradient measures 4.0 mmHg. Aortic valve peak gradient measures 6.2 mmHg. Aortic valve area, by VTI measures 1.95 cm. Pulmonic Valve: The pulmonic valve was normal in structure. Pulmonic valve regurgitation is not visualized. No evidence of pulmonic stenosis. Aorta: The aortic root is normal in size and structure. Venous: The inferior vena cava is dilated in size with greater than 50% respiratory variability, suggesting right atrial pressure of 8 mmHg. IAS/Shunts: No atrial level shunt detected by color flow Doppler.  LEFT VENTRICLE PLAX 2D LVIDd:         5.00 cm   Diastology LVIDs:         4.70 cm   LV e' medial:    7.40 cm/s LV PW:         1.10 cm   LV E/e' medial:  14.2 LV IVS:        1.00 cm   LV e' lateral:   18.40 cm/s LVOT diam:     1.90 cm   LV E/e' lateral: 5.7 LV SV:         33 LV SV Index:   20 LVOT Area:     2.84 cm  RIGHT VENTRICLE RV Basal diam:  3.80 cm RV Mid diam:    2.80 cm RV S prime:     10.20 cm/s TAPSE (M-mode): 1.2 cm LEFT ATRIUM             Index        RIGHT ATRIUM           Index LA diam:        4.70 cm 2.83 cm/m   RA Area:     20.50  cm LA Vol (A2C):   46.5 ml 27.98 ml/m  RA Volume:   58.60 ml  35.27 ml/m LA Vol (A4C):   43.2 ml 26.00 ml/m LA Biplane Vol: 45.1 ml 27.14 ml/m  AORTIC VALVE AV Area (Vmax):    2.17 cm AV Area (Vmean):   1.96 cm AV Area (VTI):     1.95 cm AV Vmax:           125.00 cm/s AV Vmean:          89.400 cm/s AV VTI:            0.169 m AV Peak Grad:      6.2 mmHg AV Mean Grad:      4.0 mmHg LVOT Vmax:         95.65 cm/s LVOT Vmean:        61.800 cm/s LVOT VTI:          0.116 m LVOT/AV VTI ratio: 0.69 AI PHT:            225 msec  AORTA Ao Root diam: 2.90 cm Ao Asc diam:  3.00 cm MITRAL VALVE                  TRICUSPID VALVE MV Area (PHT): 5.62 cm       TR Peak grad:   37.9 mmHg MV Decel Time: 135 msec       TR Vmax:        308.00 cm/s MR Peak grad:    88.7 mmHg MR Mean grad:    64.0 mmHg    SHUNTS MR Vmax:  471.00 cm/s  Systemic VTI:  0.12 m MR Vmean:        386.0 cm/s   Systemic Diam: 1.90 cm MR PISA:         1.57 cm MR PISA Eff ROA: 10 mm MR PISA Radius:  0.50 cm MV E velocity: 105.00 cm/s Charlton Haws MD Electronically signed by Charlton Haws MD Signature Date/Time: 06/27/2023/12:23:54 PM    Final    DG CHEST PORT 1 VIEW  Result Date: 06/27/2023 CLINICAL DATA:  Dyspnea. EXAM: PORTABLE CHEST 1 VIEW COMPARISON:  06/26/2023. FINDINGS: Cardiac silhouette appears prominent. No pneumonia or pulmonary edema. No pneumothorax or pleural effusion. IMPRESSION: Enlarged cardiac silhouette. Electronically Signed   By: Layla Maw M.D.   On: 06/27/2023 09:28   CT Angio Chest PE W/Cm &/Or Wo Cm  Result Date: 06/26/2023 CLINICAL DATA:  Shortness of breath with cough EXAM: CT ANGIOGRAPHY CHEST WITH CONTRAST TECHNIQUE: Multidetector CT imaging of the chest was performed using the standard protocol during bolus administration of intravenous contrast. Multiplanar CT image reconstructions and MIPs were obtained to evaluate the vascular anatomy. RADIATION DOSE REDUCTION: This exam was performed according to the  departmental dose-optimization program which includes automated exposure control, adjustment of the mA and/or kV according to patient size and/or use of iterative reconstruction technique. CONTRAST:  75mL OMNIPAQUE IOHEXOL 350 MG/ML SOLN COMPARISON:  Chest x-ray 06/26/2023 FINDINGS: Cardiovascular: Satisfactory opacification of the pulmonary arteries to the segmental level. No evidence of pulmonary embolism. Nonaneurysmal aorta. Cardiomegaly. No pericardial effusion Mediastinum/Nodes: Patent trachea. No thyroid mass. Multiple mildly enlarged mediastinal lymph nodes. Right upper paratracheal node measures 10 mm. Prevascular lymph nodes measure up to 13 mm. Low right paratracheal nodes up to 10 mm. Esophagus within normal limits Lungs/Pleura: Moderate right pleural effusion. Diffuse hazy pulmonary density and mild diffuse septal thickening. Minimal ground-glass density in the right lower lobe. Upper Abdomen: No acute finding Musculoskeletal: No chest wall abnormality. No acute or significant osseous findings. Review of the MIP images confirms the above findings. IMPRESSION: 1. Negative for acute pulmonary embolus. 2. Cardiomegaly with moderate right pleural effusion and diffuse hazy pulmonary density and mild septal thickening, findings are suggestive of pulmonary edema. Minimal ground-glass density in the right lower lobe, possible edema or infection. 3. Mildly enlarged mediastinal lymph nodes, likely reactive. Electronically Signed   By: Jasmine Pang M.D.   On: 06/26/2023 23:50   DG Chest 2 View  Result Date: 06/26/2023 CLINICAL DATA:  Shortness of breath EXAM: CHEST - 2 VIEW COMPARISON:  06/04/2018 FINDINGS: Cardiomegaly with slight central congestion. No mild airspace disease at the right base. Possible tiny right effusion. No pneumothorax IMPRESSION: Cardiomegaly with slight central congestion and possible tiny right effusion. Mild airspace disease at right base, atelectasis versus pneumonia. Electronically  Signed   By: Jasmine Pang M.D.   On: 06/26/2023 19:30    Pending Labs Unresulted Labs (From admission, onward)     Start     Ordered   06/28/23 0500  Heparin level (unfractionated)  Daily,   R      06/27/23 0908   06/28/23 0500  CBC  Daily,   R      06/27/23 0908   06/27/23 1600  Heparin level (unfractionated)  ONCE - URGENT,   URGENT        06/27/23 0908   06/27/23 0730  Culture, OB Urine  Once,   R        06/27/23 0729   06/27/23 0729  Urinalysis, Routine w reflex  microscopic -Urine, Clean Catch  Once,   R       Question:  Specimen Source  Answer:  Urine, Clean Catch   06/27/23 0728            Vitals/Pain Today's Vitals   06/27/23 1100 06/27/23 1130 06/27/23 1230 06/27/23 1300  BP: 115/76 111/77 122/83 (!) 126/91  Pulse: (!) 116 (!) 117 (!) 117 (!) 113  Resp: (!) 23 (!) 26 (!) 23 (!) 25  Temp:      TempSrc:      SpO2: 100% 99% 98% 97%  Weight:      Height:      PainSc:        Isolation Precautions No active isolations  Medications Medications  heparin ADULT infusion 100 units/mL (25000 units/247mL) (950 Units/hr Intravenous Infusion Verify 06/27/23 1104)  nitroGLYCERIN (NITROSTAT) SL tablet 0.4 mg (has no administration in time range)  morphine (PF) 2 MG/ML injection 2 mg (has no administration in time range)  cefTRIAXone (ROCEPHIN) 2 g in sodium chloride 0.9 % 100 mL IVPB (0 g Intravenous Stopped 06/27/23 0650)  azithromycin (ZITHROMAX) 500 mg in sodium chloride 0.9 % 250 mL IVPB (0 mg Intravenous Stopped 06/27/23 0719)  ipratropium-albuterol (DUONEB) 0.5-2.5 (3) MG/3ML nebulizer solution 3 mL (0 mLs Nebulization Hold 06/27/23 1146)  guaiFENesin (MUCINEX) 12 hr tablet 600 mg (600 mg Oral Given 06/27/23 0943)  chlorpheniramine-HYDROcodone (TUSSIONEX) 10-8 MG/5ML suspension 5 mL (has no administration in time range)  ipratropium-albuterol (DUONEB) 0.5-2.5 (3) MG/3ML nebulizer solution 3 mL (has no administration in time range)  magnesium hydroxide (MILK OF MAGNESIA)  suspension 30 mL (has no administration in time range)  polyethylene glycol (MIRALAX / GLYCOLAX) packet 17 g (17 g Oral Given 06/27/23 0945)  perflutren lipid microspheres (DEFINITY) IV suspension (3 mLs Intravenous Given 06/27/23 1205)  furosemide (LASIX) injection 40 mg (has no administration in time range)  losartan (COZAAR) tablet 25 mg (25 mg Oral Given 06/27/23 1326)  ipratropium-albuterol (DUONEB) 0.5-2.5 (3) MG/3ML nebulizer solution 3 mL (3 mLs Nebulization Given 06/26/23 2211)  iohexol (OMNIPAQUE) 350 MG/ML injection 75 mL (75 mLs Intravenous Contrast Given 06/26/23 2324)  aspirin chewable tablet 324 mg (324 mg Oral Given 06/26/23 2333)  morphine (PF) 4 MG/ML injection 4 mg (4 mg Intravenous Given 06/27/23 0112)  ondansetron (ZOFRAN) injection 4 mg (4 mg Intravenous Given 06/27/23 0115)  heparin bolus via infusion 3,800 Units (3,800 Units Intravenous Bolus from Bag 06/27/23 0123)  sodium bicarbonate injection 50 mEq (50 mEq Intravenous Given 06/27/23 0921)    Mobility walks     Focused Assessments Cardiac Assessment Handoff:  Cardiac Rhythm: Sinus tachycardia No results found for: "CKTOTAL", "CKMB", "CKMBINDEX", "TROPONINI" Lab Results  Component Value Date   DDIMER 4.55 (H) 06/26/2023   Does the Patient currently have chest pain? No    R Recommendations: See Admitting Provider Note  Report given to:   Additional Notes:

## 2023-06-27 NOTE — ED Notes (Addendum)
Date and time results received: 06/27/23 9:06 AM  (use smartphrase ".now" to insert current time)  Test: Lactic Acid Critical Value: 8.2  Name of Provider Notified: Dr. Sunnie Nielsen  Orders Received? Or Actions Taken?:

## 2023-06-27 NOTE — ED Notes (Signed)
ED Provider at bedside. 

## 2023-06-27 NOTE — ED Notes (Signed)
Echo tech at bedside.

## 2023-06-27 NOTE — Plan of Care (Signed)

## 2023-06-27 NOTE — Progress Notes (Signed)
PROGRESS NOTE  Jennifer King HYQ:[id number] DOB: Jan 30, 1971 DOA: 06/26/2023 PCP: Patient, No Pcp Per   LOS: 0 days   Brief Narrative / Interim history: 52 year old female prior tobacco use, quit about a month ago who comes into the hospital with sudden onset midsternal chest pain with dyspnea.  Feels like someone sitting on her chest.  Apparently about 3 weeks ago she was having URI symptoms, was given a Z-Pak but did not feel a whole lot better.  She was seen again afterwards and was given an antibiotic injection per her report.  Over the past week, she has experienced intermittent chest discomfort, coming and going as well as significant dyspnea with exertion, barely able to walk just a few feet without having to stop and catch her breath.  Workup in the ED concerning for non-STEMI she initially presented to [facility], and was transferred to [facility].  Apparently prior to transfer clinically seems to be worse with worsening respiratory status requiring BiPAP placement  Subjective / 24h Interval events: She appears comfortable upon arrival to [facility].  She is on nasal cannula, feels well and does not have any increased work of breathing, denies any shortness of breath.  Denies any chest pain.  Assesement and Plan: Principal Problem:   NSTEMI (non-ST elevated myocardial infarction) (HCC) Active Problems:   Sepsis due to pneumonia (HCC)   Tobacco abuse  Principal problem Non-STEMI-with significant elevation of troponin, it is possible that she may have myocarditis following a URI.  Has been placed on heparin, continue.  Cardiology consulted, appreciate input  Active problems Concern for CHF, acute -2D echo pending.  Received Lasix with improvement in her respiratory status.  Will follow on the echo results.  Definitely will need more diuretics but hold for now.  BNP significantly elevated  Elevated lactic acid-unclear significance, stop IV fluids as she does not look septic.  pH  was normal but does show a degree of respiratory alkalosis in the setting of her increased work of breathing.  She looks much more comfortable my evaluation  Acute hypoxic respiratory failure-due to CHF.  Will monitor hemodynamics and consider more Lasix once echo is back  Possible pneumonia-on antibiotics, continue.  Procalcitonin minimally elevated at 0.12  Tobacco use, in remission-quit a month ago  Scheduled Meds:  guaiFENesin  600 mg Oral BID   ipratropium-albuterol  3 mL Nebulization QID   polyethylene glycol  17 g Oral Daily   Continuous Infusions:  azithromycin Stopped (06/27/23 0719)   cefTRIAXone (ROCEPHIN)  IV Stopped (06/27/23 0650)   heparin 950 Units/hr (06/27/23 1104)   PRN Meds:.chlorpheniramine-HYDROcodone, ipratropium-albuterol, magnesium hydroxide, morphine injection, nitroGLYCERIN  Current Outpatient Medications  Medication Instructions   benzonatate (TESSALON) 100 mg, Oral, Every 8 hours   cetirizine (ZYRTEC) 5 mg, Oral, Daily   cyclobenzaprine (FLEXERIL) 10 mg, Oral, Daily at bedtime   fluticasone (FLONASE) 50 MCG/ACT nasal spray 1 spray, Each Nare, Daily   guaiFENesin 200 mg, Oral, Every 4 hours PRN   JUNEL 1/20 1-20 MG-MCG tablet 1 TAB(S) ONCE A DAY ORALLY 84   predniSONE (STERAPRED UNI-PAK 21 TAB) 10 MG (21) TBPK tablet Oral, Daily, Take 6 tabs by mouth daily  for 1 days, then 5 tabs for 1 days, then 4 tabs for 1 days, then 3 tabs for 1 days, 2 tabs for 1 days, then 1 tab by mouth daily for 1 days      DVT prophylaxis:    Lab Results  Component Value Date  PLT 135 (L) 06/27/2023      Code Status: Not on file  Family Communication: No family at bedside  Status is: Inpatient Remains inpatient appropriate because: Severity of illness   Level of care: Progressive  Consultants:  Cardiology  Objective: Vitals:   06/27/23 0800 06/27/23 0900 06/27/23 1000 06/27/23 1100  BP: (!) 125/107 115/87 104/80 115/76  Pulse:  (!) 124 (!) 116 (!) 116   Resp: (!) 28 (!) 25 (!) 21 (!) 23  Temp:  98.4 F (36.9 C)    TempSrc:  Rectal    SpO2:  96% 100% 100%  Weight:      Height:        Intake/Output Summary (Last 24 hours) at 06/27/2023 1135 Last data filed at 06/27/2023 1104 Gross per 24 hour  Intake 108.67 ml  Output --  Net 108.67 ml   Wt Readings from Last 3 Encounters:  06/26/23 63.5 kg  06/04/18 62.1 kg  01/24/16 59 kg    Examination:  Constitutional: NAD, appears comfortable Eyes: no scleral icterus ENMT: Mucous membranes are moist.  Neck: normal, supple Respiratory: Faint bibasilar crackles, diminished at the bases, no wheezing heard. Cardiovascular: Regular rate and rhythm, no murmurs / rubs / gallops.  Trace edema Abdomen: non distended, no tenderness. Bowel sounds positive.  Musculoskeletal: no clubbing / cyanosis.  Skin: no rashes Neurologic: non focal   Data Reviewed: I have independently reviewed following labs and imaging studies   CBC Recent Labs  Lab 06/26/23 2134 06/27/23 0831  WBC 11.4* 13.9*  HGB 12.7 12.6  HCT 39.1 42.9  PLT 155 135*  MCV 90.7 99.5  MCH 29.5 29.2  MCHC 32.5 29.4*  RDW 14.8 15.0  LYMPHSABS 2.8  --   MONOABS 0.7  --   EOSABS 0.1  --   BASOSABS 0.0  --     Recent Labs  Lab 06/26/23 2134 06/27/23 0613 06/27/23 0727 06/27/23 0831  NA 135  --   --  136  K 3.6  --   --  3.6  CL 105  --   --  103  CO2 18*  --   --  14*  GLUCOSE 100*  --   --  130*  BUN 15  --   --  16  CREATININE 0.72  --   --  1.00  CALCIUM 8.9  --   --  8.2*  MG  --   --   --  2.2  DDIMER 4.55*  --   --   --   PROCALCITON  --   --  0.12  --   LATICACIDVEN  --  3.6*  --  8.2*  INR  --  1.5*  --   --   BNP  --   --   --  1,741.0*    ------------------------------------------------------------------------------------------------------------------ No results for input(s): "CHOL", "HDL", "LDLCALC", "TRIG", "CHOLHDL", "LDLDIRECT" in the last 72 hours.  No results found for:  "HGBA1C" ------------------------------------------------------------------------------------------------------------------ No results for input(s): "TSH", "T4TOTAL", "T3FREE", "THYROIDAB" in the last 72 hours.  Invalid input(s): "FREET3"  Cardiac Enzymes No results for input(s): "CKMB", "TROPONINI", "MYOGLOBIN" in the last 168 hours.  Invalid input(s): "CK" ------------------------------------------------------------------------------------------------------------------    Component Value Date/Time   BNP 1,741.0 (H) 06/27/2023 0831    CBG: No results for input(s): "GLUCAP" in the last 168 hours.  Recent Results (from the past 240 hour(s))  Resp panel by RT-PCR (RSV, Flu A&B, Covid) Anterior Nasal Swab     Status: None   Collection  Time: 06/26/23  8:21 PM   Specimen: Anterior Nasal Swab  Result Value Ref Range Status   SARS Coronavirus 2 by RT PCR NEGATIVE NEGATIVE Final    Comment: (NOTE) SARS-CoV-2 target nucleic acids are NOT DETECTED.  The SARS-CoV-2 RNA is generally detectable in upper respiratory specimens during the acute phase of infection. The lowest concentration of SARS-CoV-2 viral copies this assay can detect is 138 copies/mL. A negative result does not preclude SARS-Cov-2 infection and should not be used as the sole basis for treatment or other patient management decisions. A negative result may occur with  improper specimen collection/handling, submission of specimen other than nasopharyngeal swab, presence of viral mutation(s) within the areas targeted by this assay, and inadequate number of viral copies(<138 copies/mL). A negative result must be combined with clinical observations, patient history, and epidemiological information. The expected result is Negative.  Fact Sheet for Patients:  BloggerCourse.com  Fact Sheet for Healthcare Providers:  SeriousBroker.it  This test is no t yet approved or cleared  by the [place] FDA and  has been authorized for detection and/or diagnosis of SARS-CoV-2 by FDA under an Emergency Use Authorization (EUA). This EUA will remain  in effect (meaning this test can be used) for the duration of the COVID-19 declaration under Section 564(b)(1) of the Act, 21 U.S.C.section 360bbb-3(b)(1), unless the authorization is terminated  or revoked sooner.       Influenza A by PCR NEGATIVE NEGATIVE Final   Influenza B by PCR NEGATIVE NEGATIVE Final    Comment: (NOTE) The Xpert Xpress SARS-CoV-2/FLU/RSV plus assay is intended as an aid in the diagnosis of influenza from Nasopharyngeal swab specimens and should not be used as a sole basis for treatment. Nasal washings and aspirates are unacceptable for Xpert Xpress SARS-CoV-2/FLU/RSV testing.  Fact Sheet for Patients: BloggerCourse.com  Fact Sheet for Healthcare Providers: SeriousBroker.it  This test is not yet approved or cleared by the [place] FDA and has been authorized for detection and/or diagnosis of SARS-CoV-2 by FDA under an Emergency Use Authorization (EUA). This EUA will remain in effect (meaning this test can be used) for the duration of the COVID-19 declaration under Section 564(b)(1) of the Act, 21 U.S.C. section 360bbb-3(b)(1), unless the authorization is terminated or revoked.     Resp Syncytial Virus by PCR NEGATIVE NEGATIVE Final    Comment: (NOTE) Fact Sheet for Patients: BloggerCourse.com  Fact Sheet for Healthcare Providers: SeriousBroker.it  This test is not yet approved or cleared by the [place] FDA and has been authorized for detection and/or diagnosis of SARS-CoV-2 by FDA under an Emergency Use Authorization (EUA). This EUA will remain in effect (meaning this test can be used) for the duration of the COVID-19 declaration under Section 564(b)(1) of the Act, 21  U.S.C. section 360bbb-3(b)(1), unless the authorization is terminated or revoked.  Performed at [facility], [address] [place], [place] [address]   Culture, blood (x 2)     Status: None (Preliminary result)   Collection Time: 06/27/23  6:13 AM   Specimen: Right Antecubital; Blood  Result Value Ref Range Status   Specimen Description   Final    RIGHT ANTECUBITAL BOTTLES DRAWN AEROBIC AND ANAEROBIC   Special Requests   Final    Blood Culture adequate volume Performed at [facility], [address], [place], [place] [address]    Culture PENDING  Incomplete   Report Status PENDING  Incomplete  Culture, blood (x 2)     Status: None (Preliminary result)  Collection Time: 06/27/23  6:13 AM   Specimen: Left Antecubital; Blood  Result Value Ref Range Status   Specimen Description   Final    LEFT ANTECUBITAL BOTTLES DRAWN AEROBIC AND ANAEROBIC   Special Requests   Final    Blood Culture adequate volume Performed at [facility], [address] [place], [place] [address]    Culture PENDING  Incomplete   Report Status PENDING  Incomplete     Radiology Studies: DG CHEST PORT 1 VIEW  Result Date: 06/27/2023 CLINICAL DATA:  Dyspnea. EXAM: PORTABLE CHEST 1 VIEW COMPARISON:  06/26/2023. FINDINGS: Cardiac silhouette appears prominent. No pneumonia or pulmonary edema. No pneumothorax or pleural effusion. IMPRESSION: Enlarged cardiac silhouette. Electronically Signed   By: Layla Maw M.D.   On: 06/27/2023 09:28   CT Angio Chest PE W/Cm &/Or Wo Cm  Result Date: 06/26/2023 CLINICAL DATA:  Shortness of breath with cough EXAM: CT ANGIOGRAPHY CHEST WITH CONTRAST TECHNIQUE: Multidetector CT imaging of the chest was performed using the standard protocol during bolus administration of intravenous contrast. Multiplanar CT image reconstructions and MIPs were obtained to evaluate the vascular anatomy. RADIATION DOSE REDUCTION: This exam was performed according to the departmental  dose-optimization program which includes automated exposure control, adjustment of the mA and/or kV according to patient size and/or use of iterative reconstruction technique. CONTRAST:  75mL OMNIPAQUE IOHEXOL 350 MG/ML SOLN COMPARISON:  Chest x-ray 06/26/2023 FINDINGS: Cardiovascular: Satisfactory opacification of the pulmonary arteries to the segmental level. No evidence of pulmonary embolism. Nonaneurysmal aorta. Cardiomegaly. No pericardial effusion Mediastinum/Nodes: Patent trachea. No thyroid mass. Multiple mildly enlarged mediastinal lymph nodes. Right upper paratracheal node measures 10 mm. Prevascular lymph nodes measure up to 13 mm. Low right paratracheal nodes up to 10 mm. Esophagus within normal limits Lungs/Pleura: Moderate right pleural effusion. Diffuse hazy pulmonary density and mild diffuse septal thickening. Minimal ground-glass density in the right lower lobe. Upper Abdomen: No acute finding Musculoskeletal: No chest wall abnormality. No acute or significant osseous findings. Review of the MIP images confirms the above findings. IMPRESSION: 1. Negative for acute pulmonary embolus. 2. Cardiomegaly with moderate right pleural effusion and diffuse hazy pulmonary density and mild septal thickening, findings are suggestive of pulmonary edema. Minimal ground-glass density in the right lower lobe, possible edema or infection. 3. Mildly enlarged mediastinal lymph nodes, likely reactive. Electronically Signed   By: Jasmine Pang M.D.   On: 06/26/2023 23:50   DG Chest 2 View  Result Date: 06/26/2023 CLINICAL DATA:  Shortness of breath EXAM: CHEST - 2 VIEW COMPARISON:  06/04/2018 FINDINGS: Cardiomegaly with slight central congestion. No mild airspace disease at the right base. Possible tiny right effusion. No pneumothorax IMPRESSION: Cardiomegaly with slight central congestion and possible tiny right effusion. Mild airspace disease at right base, atelectasis versus pneumonia. Electronically Signed   By:  Jasmine Pang M.D.   On: 06/26/2023 19:30     Pamella Pert, MD, PhD [facility]  Between 7 am - 7 pm I am available, please contact me via Amion (for emergencies) or Securechat (non urgent messages)  Between 7 pm - 7 am I am not available, please contact night coverage MD/APP via Amion

## 2023-06-27 NOTE — Progress Notes (Signed)
PROGRESS NOTE    Nelwyn Austell  WJX:[id number] DOB: [birth year] DOA: 06/26/2023 PCP: Patient, No Pcp Per   Brief Narrative: 52 year old PMH tobacco use, former, who presents to the ED complaining of worsening shortness of breath and cough.  She reports 2 weeks ago she had some sinus congestions and mild sore throat.  Evaluation in the ED she was found to have lactic acidosis, respiratory distress, elevation of troponin 1600, there was concern for sepsis and pneumonia and patient was also started on IV antibiotics.  On my evaluation prior to transfer patient was noted to be in respiratory distress, tachypneic respiration rate in the 40s, very pale, extremities cold, she was placed on BiPAP, received amp of bicarb, labs reorder.  Cardiology was informed of transfer to [facility], CCM was also contacted they will see an evaluation to [facility].  After she was placed on BiPAP she appears to be more comfortable.   Assessment & Plan:   Principal Problem:   NSTEMI (non-ST elevated myocardial infarction) (HCC) Active Problems:   Sepsis due to pneumonia (HCC)   Tobacco abuse   1-Acute Respiratory distress -Patient with tachypnea, pale, cold extremities -Place on BIPAP.  -Check ABG stat.  -BNP elevated 1700, patient was placed on BiPAP.  She received amp of bicarb. -CCM has been consulted they will see patient on arrival to [facility], ED. -No Bed available will proceed with ED to ED transfer.  ED physician will assist with the transfer -Concern for acute diastolic/systolic heart failure exacerbation, she will need a stat echo.  2-PNA, Possible para-pneumonic Effusion.  -Placed on BiPAP, continue with IV antibiotics.  Lactic acidosis.  Patient with severe lactic acidosis, suspect hypoperfusion, concern for cardiogenic shock although blood pressure has been normal, MAP of more than  65 On IV antibiotics, denies abdominal pain.  Will need further evaluation.  CCM has been consulted.  NSTEMI:   Patient reported having chest pain last week, troponin elevated, she will be transferred to [facility] for cardiology evaluation.  She will need a stat 2D echo.  Estimated body mass index is 24.8 kg/m as calculated from the following:   Height as of this encounter: 5\' 3"  (1.6 m).   Weight as of this encounter: 63.5 kg.   DVT prophylaxis:  Code Status:  Family Communication: Husband and son at bedside Disposition Plan:  Status is: Inpatient Remains inpatient appropriate because: Management of respiratory failure, non-STEMI, hypoperfusion, pneumonia    Consultants:  Cardiology CCM    Antimicrobials:    Subjective: Patient very tachypneic distress, reports shortness of breath, denies chest pain, she reported chest pain last week.  Objective: Vitals:   06/27/23 0400 06/27/23 0445 06/27/23 0500 06/27/23 0613  BP: (!) 129/93  (!) 130/94   Pulse: (!) 114 (!) 116 (!) 112   Resp: (!) 30 (!) 27 (!) 36   Temp:    98.7 F (37.1 C)  TempSrc:    Oral  SpO2: 93% 96% 96%   Weight:      Height:       No intake or output data in the 24 hours ending 06/27/23 0812 Filed Weights   06/26/23 1753  Weight: 63.5 kg    Examination:  General exam: tachypnea.  Respiratory system:Tachypnea, Crackles.  Cardiovascular system: S1 & S2 heard, RRR.  Gastrointestinal system: Abdomen is nondistended, soft and nontender. No organomegaly or masses felt. Normal bowel sounds heard. Central nervous system: Alert and oriented.  Extremities: Symmetric 5 x 5 power.  Data Reviewed: I have personally reviewed following labs and imaging studies  CBC: Recent Labs  Lab 06/26/23 2134  WBC 11.4*  NEUTROABS 7.8*  HGB 12.7  HCT 39.1  MCV 90.7  PLT 155   Basic Metabolic Panel: Recent Labs  Lab 06/26/23 2134  NA 135  K 3.6  CL 105  CO2 18*  GLUCOSE 100*  BUN 15  CREATININE 0.72  CALCIUM 8.9   GFR: Estimated Creatinine Clearance: 75.4 mL/min (by C-G formula based on SCr of 0.72  mg/dL). Liver Function Tests: No results for input(s): "AST", "ALT", "ALKPHOS", "BILITOT", "PROT", "ALBUMIN" in the last 168 hours. No results for input(s): "LIPASE", "AMYLASE" in the last 168 hours. No results for input(s): "AMMONIA" in the last 168 hours. Coagulation Profile: Recent Labs  Lab 06/27/23 0613  INR 1.5*   Cardiac Enzymes: No results for input(s): "CKTOTAL", "CKMB", "CKMBINDEX", "TROPONINI" in the last 168 hours. BNP (last 3 results) No results for input(s): "PROBNP" in the last 8760 hours. HbA1C: No results for input(s): "HGBA1C" in the last 72 hours. CBG: No results for input(s): "GLUCAP" in the last 168 hours. Lipid Profile: No results for input(s): "CHOL", "HDL", "LDLCALC", "TRIG", "CHOLHDL", "LDLDIRECT" in the last 72 hours. Thyroid Function Tests: No results for input(s): "TSH", "T4TOTAL", "FREET4", "T3FREE", "THYROIDAB" in the last 72 hours. Anemia Panel: No results for input(s): "VITAMINB12", "FOLATE", "FERRITIN", "TIBC", "IRON", "RETICCTPCT" in the last 72 hours. Sepsis Labs: Recent Labs  Lab 06/27/23 4098  LATICACIDVEN 3.6*    Recent Results (from the past 240 hour(s))  Resp panel by RT-PCR (RSV, Flu A&B, Covid) Anterior Nasal Swab     Status: None   Collection Time: 06/26/23  8:21 PM   Specimen: Anterior Nasal Swab  Result Value Ref Range Status   SARS Coronavirus 2 by RT PCR NEGATIVE NEGATIVE Final    Comment: (NOTE) SARS-CoV-2 target nucleic acids are NOT DETECTED.  The SARS-CoV-2 RNA is generally detectable in upper respiratory specimens during the acute phase of infection. The lowest concentration of SARS-CoV-2 viral copies this assay can detect is 138 copies/mL. A negative result does not preclude SARS-Cov-2 infection and should not be used as the sole basis for treatment or other patient management decisions. A negative result may occur with  improper specimen collection/handling, submission of specimen other than nasopharyngeal swab,  presence of viral mutation(s) within the areas targeted by this assay, and inadequate number of viral copies(<138 copies/mL). A negative result must be combined with clinical observations, patient history, and epidemiological information. The expected result is Negative.  Fact Sheet for Patients:  BloggerCourse.com  Fact Sheet for Healthcare Providers:  SeriousBroker.it  This test is no t yet approved or cleared by the [place] FDA and  has been authorized for detection and/or diagnosis of SARS-CoV-2 by FDA under an Emergency Use Authorization (EUA). This EUA will remain  in effect (meaning this test can be used) for the duration of the COVID-19 declaration under Section 564(b)(1) of the Act, 21 U.S.C.section 360bbb-3(b)(1), unless the authorization is terminated  or revoked sooner.       Influenza A by PCR NEGATIVE NEGATIVE Final   Influenza B by PCR NEGATIVE NEGATIVE Final    Comment: (NOTE) The Xpert Xpress SARS-CoV-2/FLU/RSV plus assay is intended as an aid in the diagnosis of influenza from Nasopharyngeal swab specimens and should not be used as a sole basis for treatment. Nasal washings and aspirates are unacceptable for Xpert Xpress SARS-CoV-2/FLU/RSV testing.  Fact Sheet for Patients: BloggerCourse.com  Fact  Sheet for Healthcare Providers: SeriousBroker.it  This test is not yet approved or cleared by the [place] and has been authorized for detection and/or diagnosis of SARS-CoV-2 by FDA under an Emergency Use Authorization (EUA). This EUA will remain in effect (meaning this test can be used) for the duration of the COVID-19 declaration under Section 564(b)(1) of the Act, 21 U.S.C. section 360bbb-3(b)(1), unless the authorization is terminated or revoked.     Resp Syncytial Virus by PCR NEGATIVE NEGATIVE Final    Comment: (NOTE) Fact Sheet for  Patients: BloggerCourse.com  Fact Sheet for Healthcare Providers: SeriousBroker.it  This test is not yet approved or cleared by the [place] FDA and has been authorized for detection and/or diagnosis of SARS-CoV-2 by FDA under an Emergency Use Authorization (EUA). This EUA will remain in effect (meaning this test can be used) for the duration of the COVID-19 declaration under Section 564(b)(1) of the Act, 21 U.S.C. section 360bbb-3(b)(1), unless the authorization is terminated or revoked.  Performed at [facility], [address] [place], [place] [address]          Radiology Studies: CT Angio Chest PE W/Cm &/Or Wo Cm  Result Date: 06/26/2023 CLINICAL DATA:  Shortness of breath with cough EXAM: CT ANGIOGRAPHY CHEST WITH CONTRAST TECHNIQUE: Multidetector CT imaging of the chest was performed using the standard protocol during bolus administration of intravenous contrast. Multiplanar CT image reconstructions and MIPs were obtained to evaluate the vascular anatomy. RADIATION DOSE REDUCTION: This exam was performed according to the departmental dose-optimization program which includes automated exposure control, adjustment of the mA and/or kV according to patient size and/or use of iterative reconstruction technique. CONTRAST:  75mL OMNIPAQUE IOHEXOL 350 MG/ML SOLN COMPARISON:  Chest x-ray 06/26/2023 FINDINGS: Cardiovascular: Satisfactory opacification of the pulmonary arteries to the segmental level. No evidence of pulmonary embolism. Nonaneurysmal aorta. Cardiomegaly. No pericardial effusion Mediastinum/Nodes: Patent trachea. No thyroid mass. Multiple mildly enlarged mediastinal lymph nodes. Right upper paratracheal node measures 10 mm. Prevascular lymph nodes measure up to 13 mm. Low right paratracheal nodes up to 10 mm. Esophagus within normal limits Lungs/Pleura: Moderate right pleural effusion. Diffuse hazy pulmonary density and mild  diffuse septal thickening. Minimal ground-glass density in the right lower lobe. Upper Abdomen: No acute finding Musculoskeletal: No chest wall abnormality. No acute or significant osseous findings. Review of the MIP images confirms the above findings. IMPRESSION: 1. Negative for acute pulmonary embolus. 2. Cardiomegaly with moderate right pleural effusion and diffuse hazy pulmonary density and mild septal thickening, findings are suggestive of pulmonary edema. Minimal ground-glass density in the right lower lobe, possible edema or infection. 3. Mildly enlarged mediastinal lymph nodes, likely reactive. Electronically Signed   By: Jasmine Pang M.D.   On: 06/26/2023 23:50   DG Chest 2 View  Result Date: 06/26/2023 CLINICAL DATA:  Shortness of breath EXAM: CHEST - 2 VIEW COMPARISON:  06/04/2018 FINDINGS: Cardiomegaly with slight central congestion. No mild airspace disease at the right base. Possible tiny right effusion. No pneumothorax IMPRESSION: Cardiomegaly with slight central congestion and possible tiny right effusion. Mild airspace disease at right base, atelectasis versus pneumonia. Electronically Signed   By: Jasmine Pang M.D.   On: 06/26/2023 19:30        Scheduled Meds:  guaiFENesin  600 mg Oral BID   ipratropium-albuterol  3 mL Nebulization QID   polyethylene glycol  17 g Oral Daily   Continuous Infusions:  sodium chloride 150 mL/hr (06/27/23 0612)   azithromycin Stopped (06/27/23 0719)  cefTRIAXone (ROCEPHIN)  IV Stopped (06/27/23 0650)   heparin 750 Units/hr (06/27/23 0123)     LOS: 0 days    Time spent: 35 minutes    Siddhant Hashemi A Jaegar Croft, MD [facility]   If 7PM-7AM, please contact night-coverage [email or web address]  06/27/2023, 8:12 AM

## 2023-06-27 NOTE — ED Notes (Signed)
Pt lying in bed, awake and comfortable. Endorses pain relief with previously administered morphine. 0/10 at this time. Remains tachycardic.

## 2023-06-27 NOTE — Progress Notes (Signed)
PHARMACY - ANTICOAGULATION CONSULT NOTE  Pharmacy Consult for heparin Indication: ACS  Allergies  Allergen Reactions   Sulfa Antibiotics Hives    Patient Measurements: Height: 5\' 3"  (160 cm) Weight: 63.5 kg (140 lb) IBW/kg (Calculated) : 52.4 Heparin Dosing Weight: 63.5 kg  Vital Signs: Temp: 98.6 F (37 C) (12/07 1403) Temp Source: Oral (12/07 1403) BP: 130/86 (12/07 1403) Pulse Rate: 116 (12/07 1330)  Labs: Recent Labs    06/26/23 2134 06/26/23 2333 06/27/23 0524 06/27/23 0613 06/27/23 0831 06/27/23 1514  HGB 12.7  --   --   --  12.6  --   HCT 39.1  --   --   --  42.9  --   PLT 155  --   --   --  135*  --   APTT  --   --   --  47*  --   --   LABPROT  --   --   --  18.1*  --   --   INR  --   --   --  1.5*  --   --   HEPARINUNFRC  --   --   --  0.10*  --  <0.10*  CREATININE 0.72  --   --   --  1.00  --   TROPONINIHS 1,168* 1,033* 1,067*  --   --   --     Estimated Creatinine Clearance: 60.4 mL/min (by C-G formula based on SCr of 1 mg/dL).  Assessment: 54 yoF presented to ED with SOB and cough. Pharmacy consulted to dose heparin for pulmonary embolism and ACS. -CBC WNL -Troponins 1168 > 1033> 1067 -no PTA anticoagulation -CTA: negative for acute PE, will continue heparin for ACS  Heparin level came back undetectable this PM. No issue with drip. We will re-bolus and increase rate.   Goal of Therapy:  Heparin level 0.3-0.7 units/ml Monitor platelets by anticoagulation protocol: Yes   Plan:  Give 2000 units bolus x 1 Increase  heparin infusion at 1100 units/hr Check anti-Xa level in AM and daily while on heparin Continue to monitor H&H and platelets  Ulyses Southward, PharmD, BCIDP, AAHIVP, CPP Infectious Disease Pharmacist 06/27/2023 4:10 PM

## 2023-06-27 NOTE — ED Provider Notes (Signed)
Patient is awaiting for progressive bed at [facility].  No beds available.  Hospitalist on-call today contacted critical care.  Patient now requiring BiPAP.  Blood pressures are holding with systolics around 120.  She has received antibiotics for questionable pneumonia.  Lactic acid was elevated.  Patient already has orders for admission down to [facility] but we feel that she needs to be transferred ED to ED so that she can be evaluated by hospitalist and critical care.  Since she is becoming sicker here.  I will contact [facility] for accepting ED physician.   Vanetta Mulders, MD 06/27/23 [phone]

## 2023-06-27 NOTE — Consult Note (Addendum)
CARDIOLOGY CONSULT NOTE       Patient ID: Jennifer King MRN: [id number] DOB/AGE: Sep 08, 1970 52 y.o.  Admit date: 06/26/2023 Referring Physician: Sunnie Nielsen Primary Physician: Patient, No Pcp Per Primary Cardiologist: None Reason for Consultation: Elevated troponin  Principal Problem:   NSTEMI (non-ST elevated myocardial infarction) (HCC) Active Problems:   Sepsis due to pneumonia (HCC)   Tobacco abuse   HPI:  52 y.o. transferred from [facility] by hospitalist for pneumonia with elevated troponin and chest pain. The patient is very stable with normal BP/sats and no current chest pain Her pain was only with coughing She has been sick for 3 weeks Seen at urgent care with Rx erythromycin then seen at [facility] for steroid shots she is a long time smoker and quit a month ago. She has had low grade fever with nasal congestion, sore throat and earache.   Two-view chest x-ray showed cardiomegaly with slight central congestion and possible tiny right pleural effusion and mild airspace disease at the right base, atelectasis versus pneumonia. Chest CTA revealed no evidence for PE.  It showed cardiomegaly with moderate right pleural effusion and diffuse hazy pulmonary density with mild septal thickening, findings are suggestive of pulmonary edema with minimal groundglass density in the right lower lobe, possible edema or infection and mildly enlarged mediastinal lymph node likely reactive.  WBC elevated with mild left shift. Her ECG is non acute with no signs of pericarditis or ischemia troponin has been elevated peak 1168. And BNP 1741.  Note her lactic acid has spiked from 3.6 to 8.2 but pro calcitonin is negative and BC;s pending   ROS All other systems reviewed and negative except as noted above  History reviewed. No pertinent past medical history.  Family History  Problem Relation Age of Onset   Healthy Mother    Healthy Father     Social History   Socioeconomic History   Marital status: Married     Spouse name: Not on file   Number of children: Not on file   Years of education: Not on file   Highest education level: Not on file  Occupational History   Not on file  Tobacco Use   Smoking status: Former    Current packs/day: 0.50    Types: Cigarettes   Smokeless tobacco: Never  Substance and Sexual Activity   Alcohol use: Not Currently   Drug use: Not Currently   Sexual activity: Not on file  Other Topics Concern   Not on file  Social History Narrative   Not on file   Social Determinants of Health   Financial Resource Strain: Not on file  Food Insecurity: No Food Insecurity (05/16/2019)   Received from [facility], [facility]   Hunger Vital Sign    Worried About Running Out of Food in the Last Year: Never true    Ran Out of Food in the Last Year: Never true  Transportation Needs: No Transportation Needs (05/16/2019)   Received from [facility], [facility]   [facility] - Transportation    Lack of Transportation (Medical): No    Lack of Transportation (Non-Medical): No  Physical Activity: Not on file  Stress: No Stress Concern Present (05/16/2019)   Received from [facility], [facility] - Occupational Stress Questionnaire    Feeling of Stress : Not at all  Social Connections: Not on file  Intimate Partner Violence: Not At Risk (10/20/2022)   Received from [facility]   Humiliation, Afraid, Rape, and Kick questionnaire    Fear of Current or Ex-Partner: No    Emotionally Abused: No    Physically Abused: No    Sexually Abused: No    History reviewed. No pertinent surgical history.    Current Facility-Administered Medications:    azithromycin (ZITHROMAX) 500 mg in sodium chloride 0.9 % 250 mL IVPB, 500 mg, Intravenous, Q24H, Mansy, Jan A, MD, Stopped at 06/27/23 0719   cefTRIAXone (ROCEPHIN) 2 g in sodium chloride 0.9 % 100 mL IVPB, 2 g, Intravenous, Q24H, Mansy, Jan A, MD, Stopped at 06/27/23  8295   chlorpheniramine-HYDROcodone (TUSSIONEX) 10-8 MG/5ML suspension 5 mL, 5 mL, Oral, Q12H PRN, Mansy, Jan A, MD   guaiFENesin (MUCINEX) 12 hr tablet 600 mg, 600 mg, Oral, BID, Mansy, Jan A, MD, 600 mg at 06/27/23 0943   heparin ADULT infusion 100 units/mL (25000 units/237mL), 950 Units/hr, Intravenous, Continuous, Regalado, Belkys A, MD, Last Rate: 9.5 mL/hr at 06/27/23 1104, 950 Units/hr at 06/27/23 1104   ipratropium-albuterol (DUONEB) 0.5-2.5 (3) MG/3ML nebulizer solution 3 mL, 3 mL, Nebulization, QID, Mansy, Jan A, MD, 3 mL at 06/27/23 0830   ipratropium-albuterol (DUONEB) 0.5-2.5 (3) MG/3ML nebulizer solution 3 mL, 3 mL, Nebulization, Q4H PRN, Mansy, Jan A, MD   magnesium hydroxide (MILK OF MAGNESIA) suspension 30 mL, 30 mL, Oral, Daily PRN, Mansy, Jan A, MD   morphine (PF) 2 MG/ML injection 2 mg, 2 mg, Intravenous, Q2H PRN, Mansy, Jan A, MD   nitroGLYCERIN (NITROSTAT) SL tablet 0.4 mg, 0.4 mg, Sublingual, Q5 min PRN, Mansy, Jan A, MD   polyethylene glycol (MIRALAX / GLYCOLAX) packet 17 g, 17 g, Oral, Daily, Mansy, Jan A, MD, 17 g at 06/27/23 0945  Current Outpatient Medications:    benzonatate (TESSALON) 100 MG capsule, Take 1 capsule (100 mg total) by mouth every 8 (eight) hours., Disp: 21 capsule, Rfl: 0   cetirizine (ZYRTEC) 5 MG tablet, Take 5 mg by mouth daily., Disp: , Rfl:    cyclobenzaprine (FLEXERIL) 10 MG tablet, Take 1 tablet (10 mg total) by mouth at bedtime., Disp: 30 tablet, Rfl: 0   fluticasone (FLONASE) 50 MCG/ACT nasal spray, Place 1 spray into both nostrils daily for 14 days., Disp: 16 g, Rfl: 0   guaiFENesin 200 MG tablet, Take 1 tablet (200 mg total) by mouth every 4 (four) hours as needed for cough or to loosen phlegm., Disp: 30 suppository, Rfl: 0   JUNEL 1/20 1-20 MG-MCG tablet, 1 TAB(S) ONCE A DAY ORALLY 84, Disp: , Rfl: 3   predniSONE (STERAPRED UNI-PAK 21 TAB) 10 MG (21) TBPK tablet, Take by mouth daily. Take 6 tabs by mouth daily  for 1 days, then 5 tabs for 1  days, then 4 tabs for 1 days, then 3 tabs for 1 days, 2 tabs for 1 days, then 1 tab by mouth daily for 1 days, Disp: 21 tablet, Rfl: 0  guaiFENesin  600 mg Oral BID   ipratropium-albuterol  3 mL Nebulization QID   polyethylene glycol  17 g Oral Daily    azithromycin Stopped (06/27/23 0719)   cefTRIAXone (ROCEPHIN)  IV Stopped (06/27/23 0650)   heparin 950 Units/hr (06/27/23 1104)    Physical Exam: Blood pressure 111/77, pulse (!) 117, temperature 98.4 F (36.9 C), temperature source Rectal, resp. rate (!) 26, height 5\' 3"  (1.6 m), weight 63.5 kg, SpO2 99%.    No distress Does not look toxic Decrease BS right base Apical MR murmur Abdomen benign No  edema Palpable pedal pulses   Labs:   Lab Results  Component Value Date   WBC 13.9 (H) 06/27/2023   HGB 12.6 06/27/2023   HCT 42.9 06/27/2023   MCV 99.5 06/27/2023   PLT 135 (L) 06/27/2023    Recent Labs  Lab 06/27/23 0831  NA 136  K 3.6  CL 103  CO2 14*  BUN 16  CREATININE 1.00  CALCIUM 8.2*  GLUCOSE 130*   No results found for: "CKTOTAL", "CKMB", "CKMBINDEX", "TROPONINI" No results found for: "CHOL" No results found for: "HDL" No results found for: "LDLCALC" No results found for: "TRIG" No results found for: "CHOLHDL" No results found for: "LDLDIRECT"    Radiology: DG CHEST PORT 1 VIEW  Result Date: 06/27/2023 CLINICAL DATA:  Dyspnea. EXAM: PORTABLE CHEST 1 VIEW COMPARISON:  06/26/2023. FINDINGS: Cardiac silhouette appears prominent. No pneumonia or pulmonary edema. No pneumothorax or pleural effusion. IMPRESSION: Enlarged cardiac silhouette. Electronically Signed   By: Layla Maw M.D.   On: 06/27/2023 09:28   CT Angio Chest PE W/Cm &/Or Wo Cm  Result Date: 06/26/2023 CLINICAL DATA:  Shortness of breath with cough EXAM: CT ANGIOGRAPHY CHEST WITH CONTRAST TECHNIQUE: Multidetector CT imaging of the chest was performed using the standard protocol during bolus administration of intravenous contrast. Multiplanar  CT image reconstructions and MIPs were obtained to evaluate the vascular anatomy. RADIATION DOSE REDUCTION: This exam was performed according to the departmental dose-optimization program which includes automated exposure control, adjustment of the mA and/or kV according to patient size and/or use of iterative reconstruction technique. CONTRAST:  75mL OMNIPAQUE IOHEXOL 350 MG/ML SOLN COMPARISON:  Chest x-ray 06/26/2023 FINDINGS: Cardiovascular: Satisfactory opacification of the pulmonary arteries to the segmental level. No evidence of pulmonary embolism. Nonaneurysmal aorta. Cardiomegaly. No pericardial effusion Mediastinum/Nodes: Patent trachea. No thyroid mass. Multiple mildly enlarged mediastinal lymph nodes. Right upper paratracheal node measures 10 mm. Prevascular lymph nodes measure up to 13 mm. Low right paratracheal nodes up to 10 mm. Esophagus within normal limits Lungs/Pleura: Moderate right pleural effusion. Diffuse hazy pulmonary density and mild diffuse septal thickening. Minimal ground-glass density in the right lower lobe. Upper Abdomen: No acute finding Musculoskeletal: No chest wall abnormality. No acute or significant osseous findings. Review of the MIP images confirms the above findings. IMPRESSION: 1. Negative for acute pulmonary embolus. 2. Cardiomegaly with moderate right pleural effusion and diffuse hazy pulmonary density and mild septal thickening, findings are suggestive of pulmonary edema. Minimal ground-glass density in the right lower lobe, possible edema or infection. 3. Mildly enlarged mediastinal lymph nodes, likely reactive. Electronically Signed   By: Jasmine Pang M.D.   On: 06/26/2023 23:50   DG Chest 2 View  Result Date: 06/26/2023 CLINICAL DATA:  Shortness of breath EXAM: CHEST - 2 VIEW COMPARISON:  06/04/2018 FINDINGS: Cardiomegaly with slight central congestion. No mild airspace disease at the right base. Possible tiny right effusion. No pneumothorax IMPRESSION:  Cardiomegaly with slight central congestion and possible tiny right effusion. Mild airspace disease at right base, atelectasis versus pneumonia. Electronically Signed   By: Jasmine Pang M.D.   On: 06/26/2023 19:30    EKG: ST no acute ST changes    ASSESSMENT AND PLAN:   Elevated troponin:  SSCP mostly with coughing from pneumonia. ECG is non acute and she has no current chest pain. Will see what echo shows as this may be more myopericarditis than and ischemic rise in troponin. Discussed possibility of needing heart cath on Monday Start heparin.   Shared Decision  Making/Informed Consent The risks [stroke (1 in 1000), death (1 in 1000), kidney failure [usually temporary] (1 in 500), bleeding (1 in 200), allergic reaction [possibly serious] (1 in 200)], benefits (diagnostic support and management of coronary artery disease) and alternatives of a cardiac catheterization were discussed in detail with Jennifer King and she is willing to proceed.  Signed:  2. CHF:  BNP elevated with cardiomegaly ? Small right effusion GDMT based on TTE result Dose of lasix appropriate for today she also has an MR murmur on exam  3. Pneumonia:  Does not appear toxic enough to have Lactic acid over 8 would repeat. On Rocephin and Zithromax Continue duonebs Cultures pending COVID/Flu negative Consider adding 20 path viral panel     Charlton Haws 06/27/2023, 12:05 PM

## 2023-06-27 NOTE — Assessment & Plan Note (Addendum)
-   Sepsis is manifested by tachycardia and tachypnea. - Her pneumonia is likely right basal with parapneumonic effusion. - The patient will be placed on IV Rocephin and Zithromax. - Mucolytic therapy and bronchodilator therapy will be provided. - We will follow-up cultures. - We will continue addition with IV normal saline.

## 2023-06-27 NOTE — Progress Notes (Signed)
PHARMACY - ANTICOAGULATION CONSULT NOTE  Pharmacy Consult for heparin Indication: ACS  Allergies  Allergen Reactions   Sulfa Antibiotics Hives    Patient Measurements: Height: 5\' 3"  (160 cm) Weight: 63.5 kg (140 lb) IBW/kg (Calculated) : 52.4 Heparin Dosing Weight: 63.5 kg  Vital Signs: Temp: 98.7 F (37.1 C) (12/07 0613) Temp Source: Oral (12/07 0613) BP: 125/107 (12/07 0800) Pulse Rate: 112 (12/07 0500)  Labs: Recent Labs    06/26/23 2134 06/26/23 2333 06/27/23 0524 06/27/23 0613 06/27/23 0831  HGB 12.7  --   --   --   --   HCT 39.1  --   --   --   --   PLT 155  --   --   --   --   APTT  --   --   --  47*  --   LABPROT  --   --   --  18.1*  --   INR  --   --   --  1.5*  --   HEPARINUNFRC  --   --   --  0.10*  --   CREATININE 0.72  --   --   --  1.00  TROPONINIHS 1,168* 1,033* 1,067*  --   --     Estimated Creatinine Clearance: 60.4 mL/min (by C-G formula based on SCr of 1 mg/dL).  Assessment: 89 yoF presented to ED with SOB and cough. Pharmacy consulted to dose heparin for pulmonary embolism and ACS. -CBC WNL -Troponins 1168 > 1033> 1067 -no PTA anticoagulation -CTA: negative for acute PE, will continue heparin for ACS  HL 0.1, subtherapeutic. No issues with infusion  Goal of Therapy:  Heparin level 0.3-0.7 units/ml Monitor platelets by anticoagulation protocol: Yes   Plan:  Give 2000 units bolus x 1 Increase  heparin infusion at 950 units/hr Check anti-Xa level in 6 hours and daily while on heparin Continue to monitor H&H and platelets  Elder Cyphers, BS Pharm D, BCPS Clinical Pharmacist 06/27/2023 9:03 AM

## 2023-06-28 DIAGNOSIS — I340 Nonrheumatic mitral (valve) insufficiency: Secondary | ICD-10-CM

## 2023-06-28 DIAGNOSIS — I214 Non-ST elevation (NSTEMI) myocardial infarction: Secondary | ICD-10-CM | POA: Diagnosis not present

## 2023-06-28 DIAGNOSIS — I5043 Acute on chronic combined systolic (congestive) and diastolic (congestive) heart failure: Secondary | ICD-10-CM

## 2023-06-28 LAB — COMPREHENSIVE METABOLIC PANEL
ALT: 2020 U/L — ABNORMAL HIGH (ref 0–44)
AST: 2251 U/L — ABNORMAL HIGH (ref 15–41)
Albumin: 2.6 g/dL — ABNORMAL LOW (ref 3.5–5.0)
Alkaline Phosphatase: 103 U/L (ref 38–126)
Anion gap: 12 (ref 5–15)
BUN: 15 mg/dL (ref 6–20)
CO2: 23 mmol/L (ref 22–32)
Calcium: 7.8 mg/dL — ABNORMAL LOW (ref 8.9–10.3)
Chloride: 100 mmol/L (ref 98–111)
Creatinine, Ser: 1.08 mg/dL — ABNORMAL HIGH (ref 0.44–1.00)
GFR, Estimated: >60 mL/min (ref >60)
Glucose, Bld: 118 mg/dL — ABNORMAL HIGH (ref 70–99)
Potassium: 3.2 mmol/L — ABNORMAL LOW (ref 3.5–5.1)
Sodium: 135 mmol/L (ref 135–145)
Total Bilirubin: 1.3 mg/dL — ABNORMAL HIGH (ref <1.2)
Total Protein: 5.4 g/dL — ABNORMAL LOW (ref 6.5–8.1)

## 2023-06-28 LAB — LACTIC ACID, PLASMA: Lactic Acid, Venous: 2.5 mmol/L (ref 0.5–1.9)

## 2023-06-28 LAB — CBC
HCT: 36.1 % (ref 36.0–46.0)
Hemoglobin: 11.4 g/dL — ABNORMAL LOW (ref 12.0–15.0)
MCH: 28.8 pg (ref 26.0–34.0)
MCHC: 31.6 g/dL (ref 30.0–36.0)
MCV: 91.2 fL (ref 80.0–100.0)
Platelets: 118 10*3/uL — ABNORMAL LOW (ref 150–400)
RBC: 3.96 MIL/uL (ref 3.87–5.11)
RDW: 14.8 % (ref 11.5–15.5)
WBC: 13.1 10*3/uL — ABNORMAL HIGH (ref 4.0–10.5)
nRBC: 0.4 % — ABNORMAL HIGH (ref 0.0–0.2)

## 2023-06-28 LAB — HEPATITIS PANEL, ACUTE
HCV Ab: NONREACTIVE
Hep A IgM: NONREACTIVE
Hep B C IgM: NONREACTIVE
Hepatitis B Surface Ag: NONREACTIVE

## 2023-06-28 LAB — URINALYSIS, ROUTINE W REFLEX MICROSCOPIC
Bilirubin Urine: NEGATIVE
Glucose, UA: NEGATIVE mg/dL
Hgb urine dipstick: NEGATIVE
Ketones, ur: NEGATIVE mg/dL
Leukocytes,Ua: NEGATIVE
Nitrite: NEGATIVE
Protein, ur: NEGATIVE mg/dL
Specific Gravity, Urine: 1.004 — ABNORMAL LOW (ref 1.005–1.030)
pH: 7 (ref 5.0–8.0)

## 2023-06-28 LAB — ACETAMINOPHEN LEVEL: Acetaminophen (Tylenol), Serum: <10 ug/mL — ABNORMAL LOW (ref 10–30)

## 2023-06-28 LAB — HEPARIN LEVEL (UNFRACTIONATED)
Heparin Unfractionated: <0.1 [IU]/mL — ABNORMAL LOW (ref 0.30–0.70)
Heparin Unfractionated: 0.12 [IU]/mL — ABNORMAL LOW (ref 0.30–0.70)
Heparin Unfractionated: 0.16 [IU]/mL — ABNORMAL LOW (ref 0.30–0.70)
Heparin Unfractionated: 0.3 [IU]/mL (ref 0.30–0.70)

## 2023-06-28 LAB — MAGNESIUM: Magnesium: 1.7 mg/dL (ref 1.7–2.4)

## 2023-06-28 MED ORDER — ASPIRIN 81 MG PO CHEW
81.0000 mg | CHEWABLE_TABLET | ORAL | Status: AC
  Administered 2023-06-29: 81 mg via ORAL
  Filled 2023-06-28: qty 1

## 2023-06-28 MED ORDER — SODIUM CHLORIDE 0.9 % WEIGHT BASED INFUSION: 3.0000 mL/kg/h | INTRAVENOUS | Status: DC

## 2023-06-28 MED ORDER — SODIUM CHLORIDE 0.9 % WEIGHT BASED INFUSION
1.0000 mL/kg/h | INTRAVENOUS | Status: DC
Start: 2023-06-29 — End: 2023-06-29

## 2023-06-28 MED ORDER — IPRATROPIUM-ALBUTEROL 0.5-2.5 (3) MG/3ML IN SOLN
3.0000 mL | Freq: Four times a day (QID) | RESPIRATORY_TRACT | Status: DC
  Administered 2023-06-28: 3 mL via RESPIRATORY_TRACT
  Filled 2023-06-28 (×2): qty 3

## 2023-06-28 MED ORDER — POTASSIUM CHLORIDE CRYS ER 20 MEQ PO TBCR
40.0000 meq | EXTENDED_RELEASE_TABLET | Freq: Two times a day (BID) | ORAL | Status: DC
  Administered 2023-06-28 (×2): 40 meq via ORAL
  Filled 2023-06-28 (×2): qty 2

## 2023-06-28 MED ORDER — HEPARIN BOLUS VIA INFUSION
1500.0000 [IU] | Freq: Once | INTRAVENOUS | Status: AC
  Administered 2023-06-28: 1500 [IU] via INTRAVENOUS
  Filled 2023-06-28: qty 1500

## 2023-06-28 NOTE — Progress Notes (Signed)
PHARMACY - ANTICOAGULATION CONSULT NOTE  Pharmacy Consult for heparin Indication: ACS  Allergies  Allergen Reactions   Sulfa Antibiotics Hives    Patient Measurements: Height: 5\' 3"  (160 cm) Weight: 63.5 kg (140 lb) IBW/kg (Calculated) : 52.4 Heparin Dosing Weight: 63.5 kg  Vital Signs: Temp: 98.5 F (36.9 C) (12/08 2040) Temp Source: Oral (12/08 2040) BP: 99/70 (12/08 2040) Pulse Rate: 96 (12/08 2040)  Labs: Recent Labs    06/26/23 2134 06/26/23 2333 06/27/23 0524 06/27/23 2440 06/27/23 0831 06/27/23 1514 06/28/23 0410 06/28/23 0847 06/28/23 1328 06/28/23 2129  HGB 12.7  --   --   --  12.6  --  11.4*  --   --   --   HCT 39.1  --   --   --  42.9  --  36.1  --   --   --   PLT 155  --   --   --  135*  --  118*  --   --   --   APTT  --   --   --  47*  --   --   --   --   --   --   LABPROT  --   --   --  18.1*  --   --   --   --   --   --   INR  --   --   --  1.5*  --   --   --   --   --   --   HEPARINUNFRC  --   --   --  0.10*  --    < > <0.10* 0.12* 0.16* 0.30  CREATININE 0.72  --   --   --  1.00  --   --  1.08*  --   --   TROPONINIHS 1,168* 1,033* 1,067*  --   --   --   --   --   --   --    < > = values in this interval not displayed.    Estimated Creatinine Clearance: 55.9 mL/min (A) (by C-G formula based on SCr of 1.08 mg/dL (H)).  Assessment: 52 yo F presented to ED with SOB and cough. Pharmacy consulted to dose heparin for pulmonary embolism and ACS. -CBC WNL -Troponins 1168 > 1033> 1067 -no PTA anticoagulation -CTA: negative for acute PE, will continue heparin for ACS  Heparin level came back therapeutic but on the lower side this pm. We will increase slight and check level in AM.   Goal of Therapy:  Heparin level 0.3-0.7 units/ml Monitor platelets by anticoagulation protocol: Yes   Plan:  Increase heparin infusion to 1500 units/hr Check heparin level in AMand daily while on heparin Continue to monitor H&H and platelets  Ulyses Southward, PharmD,  BCIDP, AAHIVP, CPP Infectious Disease Pharmacist 06/28/2023 10:09 PM

## 2023-06-28 NOTE — Progress Notes (Signed)
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   Objective:  Vitals:   06/28/23 0125 06/28/23 0400 06/28/23 0545 06/28/23 0843  BP: 119/80  118/83   Pulse: (!) 102 100    Resp: 18  18   Temp: 98.6 F (37 C)  97.9 F (36.6 C)   TempSrc: Oral  Oral   SpO2: 97% 97%  90%  Weight:      Height:        Intake/Output from previous day:  Intake/Output Summary (Last 24 hours) at 06/28/2023 0951 Last data filed at 06/28/2023 0532 Gross per 24 hour  Intake 1809.38 ml  Output 2750 ml  Net -940.62 ml    Physical Exam: BP 118/83 (BP Location: Left Arm)   Pulse 100   Temp 97.9 F (36.6 C) (Oral)   Resp 18   Ht 5\' 3"  (1.6 m)   Wt 63.5 kg   SpO2 90%   BMI 24.80 kg/m   Less dyspnea MR murmur Abdomen benign Trace edema Basilar rales/atelectasis   Lab Results: Basic Metabolic Panel: Recent Labs    06/27/23 0831 06/28/23 0847  NA 136 135  K 3.6 3.2*  CL 103 100  CO2 14* 23  GLUCOSE 130* 118*  BUN 16 15  CREATININE 1.00 1.08*  CALCIUM 8.2* 7.8*  MG 2.2 1.7   Liver Function Tests: Recent Labs    06/28/23 0847  AST 2,251*  ALT 2,020*  ALKPHOS 103  BILITOT 1.3*  PROT 5.4*  ALBUMIN 2.6*   CBC: Recent Labs    06/26/23 2134 06/27/23 0831 06/28/23 0410  WBC 11.4* 13.9* 13.1*  NEUTROABS 7.8*  --   --   HGB 12.7 12.6 11.4*  HCT 39.1 42.9 36.1  MCV 90.7 99.5 91.2  PLT 155 135* 118*    D-Dimer: Recent Labs    06/26/23 2134  DDIMER 4.55*    Imaging: ECHOCARDIOGRAM COMPLETE  Result Date: 06/27/2023    ECHOCARDIOGRAM REPORT   Patient Name:   Jennifer King Date of Exam: 06/27/2023 Medical Rec #:  [id number]    Height:       63.0 in Accession #:    [id number]   Weight:       140.0 lb Date of Birth:  26-Mar-1971    BSA:          1.662 m Patient Age:    52 years     BP:           115/76 mmHg Patient Gender: F            HR:           116  bpm. Exam Location:  Inpatient Procedure: 2D Echo, Color Doppler, Cardiac Doppler and Intracardiac            Opacification Agent STAT ECHO REPORT CONTAINS CRITICAL RESULT Indications:    Dyspnea  History:        Patient has no prior history of Echocardiogram examinations.                 NSTEMI, Sepsis; Risk Factors:Current Smoker.  Sonographer:    Milbert Coulter Referring Phys: [id number] BELKYS A REGALADO IMPRESSIONS  1. Left ventricular ejection fraction, by estimation, is 25 to 30%. The left ventricle has severely decreased function. The left ventricle demonstrates global hypokinesis. The left ventricular internal cavity size was moderately dilated. Left ventricular diastolic parameters are  consistent with Grade II diastolic dysfunction (pseudonormalization).  2. Right ventricular systolic function is normal. The right ventricular size is normal.  3. Left atrial size was moderately dilated.  4. The mitral valve is normal in structure. Moderate to severe mitral valve regurgitation. No evidence of mitral stenosis.  5. Tricuspid valve regurgitation is moderate.  6. The aortic valve is tricuspid. There is mild calcification of the aortic valve. There is mild thickening of the aortic valve. Aortic valve regurgitation is moderate. Aortic valve sclerosis is present, with no evidence of aortic valve stenosis.  7. The inferior vena cava is dilated in size with >50% respiratory variability, suggesting right atrial pressure of 8 mmHg. FINDINGS  Left Ventricle: Left ventricular ejection fraction, by estimation, is 25 to 30%. The left ventricle has severely decreased function. The left ventricle demonstrates global hypokinesis. Definity contrast agent was given IV to delineate the left ventricular endocardial borders. The left ventricular internal cavity size was moderately dilated. There is no left ventricular hypertrophy. Left ventricular diastolic parameters are consistent with Grade II diastolic dysfunction  (pseudonormalization). Right Ventricle: The right ventricular size is normal. No increase in right ventricular wall thickness. Right ventricular systolic function is normal. Left Atrium: Left atrial size was moderately dilated. Right Atrium: Right atrial size was normal in size. Pericardium: There is no evidence of pericardial effusion. Mitral Valve: The mitral valve is normal in structure. Moderate to severe mitral valve regurgitation. No evidence of mitral valve stenosis. Tricuspid Valve: The tricuspid valve is normal in structure. Tricuspid valve regurgitation is moderate . No evidence of tricuspid stenosis. Aortic Valve: The aortic valve is tricuspid. There is mild calcification of the aortic valve. There is mild thickening of the aortic valve. Aortic valve regurgitation is moderate. Aortic regurgitation PHT measures 225 msec. Aortic valve sclerosis is present, with no evidence of aortic valve stenosis. Aortic valve mean gradient measures 4.0 mmHg. Aortic valve peak gradient measures 6.2 mmHg. Aortic valve area, by VTI measures 1.95 cm. Pulmonic Valve: The pulmonic valve was normal in structure. Pulmonic valve regurgitation is not visualized. No evidence of pulmonic stenosis. Aorta: The aortic root is normal in size and structure. Venous: The inferior vena cava is dilated in size with greater than 50% respiratory variability, suggesting right atrial pressure of 8 mmHg. IAS/Shunts: No atrial level shunt detected by color flow Doppler.  LEFT VENTRICLE PLAX 2D LVIDd:         5.00 cm   Diastology LVIDs:         4.70 cm   LV e' medial:    7.40 cm/s LV PW:         1.10 cm   LV E/e' medial:  14.2 LV IVS:        1.00 cm   LV e' lateral:   18.40 cm/s LVOT diam:     1.90 cm   LV E/e' lateral: 5.7 LV SV:         33 LV SV Index:   20 LVOT Area:     2.84 cm  RIGHT VENTRICLE RV Basal diam:  3.80 cm RV Mid diam:    2.80 cm RV S prime:     10.20 cm/s TAPSE (M-mode): 1.2 cm LEFT ATRIUM             Index        RIGHT ATRIUM            Index LA diam:        4.70 cm 2.83 cm/m  RA Area:     20.50 cm LA Vol (A2C):   46.5 ml 27.98 ml/m  RA Volume:   58.60 ml  35.27 ml/m LA Vol (A4C):   43.2 ml 26.00 ml/m LA Biplane Vol: 45.1 ml 27.14 ml/m  AORTIC VALVE AV Area (Vmax):    2.17 cm AV Area (Vmean):   1.96 cm AV Area (VTI):     1.95 cm AV Vmax:           125.00 cm/s AV Vmean:          89.400 cm/s AV VTI:            0.169 m AV Peak Grad:      6.2 mmHg AV Mean Grad:      4.0 mmHg LVOT Vmax:         95.65 cm/s LVOT Vmean:        61.800 cm/s LVOT VTI:          0.116 m LVOT/AV VTI ratio: 0.69 AI PHT:            225 msec  AORTA Ao Root diam: 2.90 cm Ao Asc diam:  3.00 cm MITRAL VALVE                  TRICUSPID VALVE MV Area (PHT): 5.62 cm       TR Peak grad:   37.9 mmHg MV Decel Time: 135 msec       TR Vmax:        308.00 cm/s MR Peak grad:    88.7 mmHg MR Mean grad:    64.0 mmHg    SHUNTS MR Vmax:         471.00 cm/s  Systemic VTI:  0.12 m MR Vmean:        386.0 cm/s   Systemic Diam: 1.90 cm MR PISA:         1.57 cm MR PISA Eff ROA: 10 mm MR PISA Radius:  0.50 cm MV E velocity: 105.00 cm/s Charlton Haws MD Electronically signed by Charlton Haws MD Signature Date/Time: 06/27/2023/12:23:54 PM    Final    DG CHEST PORT 1 VIEW  Result Date: 06/27/2023 CLINICAL DATA:  Dyspnea. EXAM: PORTABLE CHEST 1 VIEW COMPARISON:  06/26/2023. FINDINGS: Cardiac silhouette appears prominent. No pneumonia or pulmonary edema. No pneumothorax or pleural effusion. IMPRESSION: Enlarged cardiac silhouette. Electronically Signed   By: Layla Maw M.D.   On: 06/27/2023 09:28   CT Angio Chest PE W/Cm &/Or Wo Cm  Result Date: 06/26/2023 CLINICAL DATA:  Shortness of breath with cough EXAM: CT ANGIOGRAPHY CHEST WITH CONTRAST TECHNIQUE: Multidetector CT imaging of the chest was performed using the standard protocol during bolus administration of intravenous contrast. Multiplanar CT image reconstructions and MIPs were obtained to evaluate the vascular anatomy.  RADIATION DOSE REDUCTION: This exam was performed according to the departmental dose-optimization program which includes automated exposure control, adjustment of the mA and/or kV according to patient size and/or use of iterative reconstruction technique. CONTRAST:  75mL OMNIPAQUE IOHEXOL 350 MG/ML SOLN COMPARISON:  Chest x-ray 06/26/2023 FINDINGS: Cardiovascular: Satisfactory opacification of the pulmonary arteries to the segmental level. No evidence of pulmonary embolism. Nonaneurysmal aorta. Cardiomegaly. No pericardial effusion Mediastinum/Nodes: Patent trachea. No thyroid mass. Multiple mildly enlarged mediastinal lymph nodes. Right upper paratracheal node measures 10 mm. Prevascular lymph nodes measure up to 13 mm. Low right paratracheal nodes up to 10 mm. Esophagus within normal limits Lungs/Pleura: Moderate right pleural effusion. Diffuse hazy pulmonary density and mild  diffuse septal thickening. Minimal ground-glass density in the right lower lobe. Upper Abdomen: No acute finding Musculoskeletal: No chest wall abnormality. No acute or significant osseous findings. Review of the MIP images confirms the above findings. IMPRESSION: 1. Negative for acute pulmonary embolus. 2. Cardiomegaly with moderate right pleural effusion and diffuse hazy pulmonary density and mild septal thickening, findings are suggestive of pulmonary edema. Minimal ground-glass density in the right lower lobe, possible edema or infection. 3. Mildly enlarged mediastinal lymph nodes, likely reactive. Electronically Signed   By: Jasmine Pang M.D.   On: 06/26/2023 23:50   DG Chest 2 View  Result Date: 06/26/2023 CLINICAL DATA:  Shortness of breath EXAM: CHEST - 2 VIEW COMPARISON:  06/04/2018 FINDINGS: Cardiomegaly with slight central congestion. No mild airspace disease at the right base. Possible tiny right effusion. No pneumothorax IMPRESSION: Cardiomegaly with slight central congestion and possible tiny right effusion. Mild airspace  disease at right base, atelectasis versus pneumonia. Electronically Signed   By: Jasmine Pang M.D.   On: 06/26/2023 19:30    Cardiac Studies:  ECG: ST no acute ST changes    Telemetry:  NSR   Echo: EF 25-30% mod to severe MR   Medications:    furosemide  40 mg Intravenous BID   guaiFENesin  600 mg Oral BID   ipratropium-albuterol  3 mL Nebulization QID   losartan  25 mg Oral Daily   polyethylene glycol  17 g Oral Daily      azithromycin 500 mg (06/28/23 0658)   cefTRIAXone (ROCEPHIN)  IV 2 g (06/28/23 1610)   heparin 1,300 Units/hr (06/28/23 0532)    Assessment/Plan:  Elevated troponin:  1067 SSCP mostly with coughing from pneumonia. ECG is non acute and she has no current chest pain. TTE with EF 25-30% global Not clear if she has ischemic DCM or myocarditis picture Right and left cath in am   Shared Decision Making/Informed Consent The risks [stroke (1 in 1000), death (1 in 1000), kidney failure [usually temporary] (1 in 500), bleeding (1 in 200), allergic reaction [possibly serious] (1 in 200)], benefits (diagnostic support and management of coronary artery disease) and alternatives of a cardiac catheterization were discussed in detail with Ms. Wanita Chamberlain and she is willing to proceed.  Signed:   2. CHF:  BNP elevated with cardiomegaly ? Small right effusion GDMT started with cozaar and lasix. Good diuresis about a liter EF 25-30% with mod - severe MR See above regarding rightr and left cath in am BNP 1741 continue lasix 40 mg iv bid Supplement K    3. Pneumonia:  Does not appear toxic enough to have Lactic acid over 8 would repeat. On Rocephin and Zithromax Continue duonebs Cultures pending COVID/Flu negative Consider adding 20 path viral panel suspect this is more CHF than pneumonia Consider simplifying antibiotics  Charlton Haws 06/28/2023, 9:51 AM

## 2023-06-28 NOTE — Plan of Care (Signed)

## 2023-06-28 NOTE — Progress Notes (Signed)
Date and time results received: 06/28/23 1054 (use smartphrase ".now" to insert current time)  Test: lactic acid Critical Value: 2.5  Name of Provider Notified: Dr. Elvera Lennox

## 2023-06-28 NOTE — Progress Notes (Signed)
PROGRESS NOTE  Jennifer King ION:[id number] DOB: [birth year] DOA: 06/26/2023 PCP: Patient, No Pcp Per   LOS: 1 day   Brief Narrative / Interim history: 52 year old female prior tobacco use, quit about a month ago who comes into the hospital with sudden onset midsternal chest pain with dyspnea.  Feels like someone sitting on her chest.  Apparently about 3 weeks ago she was having URI symptoms, was given a Z-Pak but did not feel a whole lot better.  She was seen again afterwards and was given an antibiotic injection per her report.  Over the past week, she has experienced intermittent chest discomfort, coming and going as well as significant dyspnea with exertion, barely able to walk just a few feet without having to stop and catch her breath.  Workup in the ED concerning for non-STEMI she initially presented to [facility], and was transferred to [facility].  Apparently prior to transfer clinically seems to be worse with worsening respiratory status requiring BiPAP placement  Subjective / 24h Interval events: Feeling well this morning, denies any chest pain, denies any shortness of breath.  She is eating breakfast  Assesement and Plan: Principal Problem:   NSTEMI (non-ST elevated myocardial infarction) (HCC) Active Problems:   Sepsis due to pneumonia (HCC)   Tobacco abuse  Principal problem Non-STEMI-with significant elevation of troponin, it is possible that she may have myocarditis following a URI.  Has been placed on heparin, continue.  Cardiology consulted, appreciate input -She is going for cardiac cath tomorrow  Active problems Acute systolic CHF-2D echocardiogram showed LVEF 25 to 30%, global hypokinesis and grade 2 diastolic dysfunction.  RV was normal -Has been placed on IV Lasix, continue.  She is improving  Elevated LFTs-with concern for ischemic liver in the setting of acute CHF.  Obtain hepatitis panel to rule out other current causes.  Discontinue ARB to avoid further  hypotension.  Discussed with cardiology  Elevated lactic acid-would not repeat this further, she is not septic appearing.  pH was normal on ABG on admission  Acute hypoxic respiratory failure-due to CHF.  Continue Lasix  Possible pneumonia-on antibiotics, continue.  Procalcitonin minimally elevated at 0.12.  Discontinue antibiotics tomorrow if cultures remain negative  Tobacco use, in remission-quit a month ago  Scheduled Meds:  furosemide  40 mg Intravenous BID   guaiFENesin  600 mg Oral BID   ipratropium-albuterol  3 mL Nebulization Q6H   polyethylene glycol  17 g Oral Daily   potassium chloride  40 mEq Oral BID   Continuous Infusions:  azithromycin 500 mg (06/28/23 0658)   cefTRIAXone (ROCEPHIN)  IV 2 g (06/28/23 2440)   heparin 1,300 Units/hr (06/28/23 0532)   PRN Meds:.chlorpheniramine-HYDROcodone, ipratropium-albuterol, magnesium hydroxide, morphine injection, nitroGLYCERIN  Current Outpatient Medications  Medication Instructions   JUNEL 1/20 1-20 MG-MCG tablet 1 TAB(S) ONCE A DAY ORALLY 84    Diet Orders (From admission, onward)     Start     Ordered   06/27/23 1527  Diet 2 gram sodium Room service appropriate? Yes; Fluid consistency: Thin  Diet effective now       Question Answer Comment  Room service appropriate? Yes   Fluid consistency: Thin      06/27/23 1526            DVT prophylaxis:    Lab Results  Component Value Date   PLT 118 (L) 06/28/2023      Code Status: Not on file  Family Communication: No family at bedside  Status  is: Inpatient Remains inpatient appropriate because: Severity of illness   Level of care: Progressive  Consultants:  Cardiology  Objective: Vitals:   06/28/23 0400 06/28/23 0545 06/28/23 0843 06/28/23 0943  BP:  118/83  110/74  Pulse: 100   (!) 102  Resp:  18  16  Temp:  97.9 F (36.6 C)    TempSrc:  Oral    SpO2: 97%  90% 95%  Weight:      Height:        Intake/Output Summary (Last 24 hours) at  06/28/2023 1139 Last data filed at 06/28/2023 1000 Gross per 24 hour  Intake 1700.71 ml  Output 4150 ml  Net -2449.29 ml   Wt Readings from Last 3 Encounters:  06/26/23 63.5 kg  06/04/18 62.1 kg  01/24/16 59 kg    Examination:  Constitutional: NAD Eyes: lids and conjunctivae normal, no scleral icterus ENMT: mmm Neck: normal, supple Respiratory: clear to auscultation bilaterally, no wheezing, no crackles. Normal respiratory effort.  Cardiovascular: Regular rate and rhythm, no murmurs / rubs / gallops. No LE edema. Abdomen: soft, no distention, no tenderness. Bowel sounds positive.  Skin: no rashes   Data Reviewed: I have independently reviewed following labs and imaging studies   CBC Recent Labs  Lab 06/26/23 2134 06/27/23 0831 06/28/23 0410  WBC 11.4* 13.9* 13.1*  HGB 12.7 12.6 11.4*  HCT 39.1 42.9 36.1  PLT 155 135* 118*  MCV 90.7 99.5 91.2  MCH 29.5 29.2 28.8  MCHC 32.5 29.4* 31.6  RDW 14.8 15.0 14.8  LYMPHSABS 2.8  --   --   MONOABS 0.7  --   --   EOSABS 0.1  --   --   BASOSABS 0.0  --   --     Recent Labs  Lab 06/26/23 2134 06/27/23 0613 06/27/23 0727 06/27/23 0831 06/28/23 0847 06/28/23 1017  NA 135  --   --  136 135  --   K 3.6  --   --  3.6 3.2*  --   CL 105  --   --  103 100  --   CO2 18*  --   --  14* 23  --   GLUCOSE 100*  --   --  130* 118*  --   BUN 15  --   --  16 15  --   CREATININE 0.72  --   --  1.00 1.08*  --   CALCIUM 8.9  --   --  8.2* 7.8*  --   AST  --   --   --   --  2,251*  --   ALT  --   --   --   --  2,020*  --   ALKPHOS  --   --   --   --  103  --   BILITOT  --   --   --   --  1.3*  --   ALBUMIN  --   --   --   --  2.6*  --   MG  --   --   --  2.2 1.7  --   DDIMER 4.55*  --   --   --   --   --   PROCALCITON  --   --  0.12  --   --   --   LATICACIDVEN  --  3.6*  --  8.2*  --  2.5*  INR  --  1.5*  --   --   --   --  BNP  --   --   --  1,741.0*  --   --      ------------------------------------------------------------------------------------------------------------------ No results for input(s): "CHOL", "HDL", "LDLCALC", "TRIG", "CHOLHDL", "LDLDIRECT" in the last 72 hours.  No results found for: "HGBA1C" ------------------------------------------------------------------------------------------------------------------ No results for input(s): "TSH", "T4TOTAL", "T3FREE", "THYROIDAB" in the last 72 hours.  Invalid input(s): "FREET3"  Cardiac Enzymes No results for input(s): "CKMB", "TROPONINI", "MYOGLOBIN" in the last 168 hours.  Invalid input(s): "CK" ------------------------------------------------------------------------------------------------------------------    Component Value Date/Time   BNP 1,741.0 (H) 06/27/2023 0831    CBG: No results for input(s): "GLUCAP" in the last 168 hours.  Recent Results (from the past 240 hour(s))  Resp panel by RT-PCR (RSV, Flu A&B, Covid) Anterior Nasal Swab     Status: None   Collection Time: 06/26/23  8:21 PM   Specimen: Anterior Nasal Swab  Result Value Ref Range Status   SARS Coronavirus 2 by RT PCR NEGATIVE NEGATIVE Final    Comment: (NOTE) SARS-CoV-2 target nucleic acids are NOT DETECTED.  The SARS-CoV-2 RNA is generally detectable in upper respiratory specimens during the acute phase of infection. The lowest concentration of SARS-CoV-2 viral copies this assay can detect is 138 copies/mL. A negative result does not preclude SARS-Cov-2 infection and should not be used as the sole basis for treatment or other patient management decisions. A negative result may occur with  improper specimen collection/handling, submission of specimen other than nasopharyngeal swab, presence of viral mutation(s) within the areas targeted by this assay, and inadequate number of viral copies(<138 copies/mL). A negative result must be combined with clinical observations, patient history, and  epidemiological information. The expected result is Negative.  Fact Sheet for Patients:  BloggerCourse.com  Fact Sheet for Healthcare Providers:  SeriousBroker.it  This test is no t yet approved or cleared by the [place] FDA and  has been authorized for detection and/or diagnosis of SARS-CoV-2 by FDA under an Emergency Use Authorization (EUA). This EUA will remain  in effect (meaning this test can be used) for the duration of the COVID-19 declaration under Section 564(b)(1) of the Act, 21 U.S.C.section 360bbb-3(b)(1), unless the authorization is terminated  or revoked sooner.       Influenza A by PCR NEGATIVE NEGATIVE Final   Influenza B by PCR NEGATIVE NEGATIVE Final    Comment: (NOTE) The Xpert Xpress SARS-CoV-2/FLU/RSV plus assay is intended as an aid in the diagnosis of influenza from Nasopharyngeal swab specimens and should not be used as a sole basis for treatment. Nasal washings and aspirates are unacceptable for Xpert Xpress SARS-CoV-2/FLU/RSV testing.  Fact Sheet for Patients: BloggerCourse.com  Fact Sheet for Healthcare Providers: SeriousBroker.it  This test is not yet approved or cleared by the [place] FDA and has been authorized for detection and/or diagnosis of SARS-CoV-2 by FDA under an Emergency Use Authorization (EUA). This EUA will remain in effect (meaning this test can be used) for the duration of the COVID-19 declaration under Section 564(b)(1) of the Act, 21 U.S.C. section 360bbb-3(b)(1), unless the authorization is terminated or revoked.     Resp Syncytial Virus by PCR NEGATIVE NEGATIVE Final    Comment: (NOTE) Fact Sheet for Patients: BloggerCourse.com  Fact Sheet for Healthcare Providers: SeriousBroker.it  This test is not yet approved or cleared by the [place] FDA and has been  authorized for detection and/or diagnosis of SARS-CoV-2 by FDA under an Emergency Use Authorization (EUA). This EUA will remain in effect (meaning this test can be used) for the  duration of the COVID-19 declaration under Section 564(b)(1) of the Act, 21 U.S.C. section 360bbb-3(b)(1), unless the authorization is terminated or revoked.  Performed at [facility], [address] [place], [place] [address]   Culture, blood (x 2)     Status: None (Preliminary result)   Collection Time: 06/27/23  6:13 AM   Specimen: Right Antecubital; Blood  Result Value Ref Range Status   Specimen Description   Final    RIGHT ANTECUBITAL BOTTLES DRAWN AEROBIC AND ANAEROBIC   Special Requests Blood Culture adequate volume  Final   Culture   Final    NO GROWTH 1 DAY Performed at [facility], [address] [place], [place] [address]    Report Status PENDING  Incomplete  Culture, blood (x 2)     Status: None (Preliminary result)   Collection Time: 06/27/23  6:13 AM   Specimen: Left Antecubital; Blood  Result Value Ref Range Status   Specimen Description   Final    LEFT ANTECUBITAL BOTTLES DRAWN AEROBIC AND ANAEROBIC   Special Requests Blood Culture adequate volume  Final   Culture   Final    NO GROWTH 1 DAY Performed at [facility], [address] [place], [place] [address]    Report Status PENDING  Incomplete     Radiology Studies: ECHOCARDIOGRAM COMPLETE  Result Date: 06/27/2023    ECHOCARDIOGRAM REPORT   Patient Name:   CHASSY NORED Date of Exam: 06/27/2023 Medical Rec #:  [id number]    Height:       63.0 in Accession #:    [id number]   Weight:       140.0 lb Date of Birth:  [birth year]    BSA:          1.662 m Patient Age:    52 years     BP:           115/76 mmHg Patient Gender: F            HR:           116 bpm. Exam Location:  Inpatient Procedure: 2D Echo, Color Doppler, Cardiac Doppler and Intracardiac            Opacification Agent STAT ECHO REPORT CONTAINS CRITICAL RESULT Indications:     Dyspnea  History:        Patient has no prior history of Echocardiogram examinations.                 NSTEMI, Sepsis; Risk Factors:Current Smoker.  Sonographer:    Milbert Coulter Referring Phys: [id number] BELKYS A REGALADO IMPRESSIONS  1. Left ventricular ejection fraction, by estimation, is 25 to 30%. The left ventricle has severely decreased function. The left ventricle demonstrates global hypokinesis. The left ventricular internal cavity size was moderately dilated. Left ventricular diastolic parameters are consistent with Grade II diastolic dysfunction (pseudonormalization).  2. Right ventricular systolic function is normal. The right ventricular size is normal.  3. Left atrial size was moderately dilated.  4. The mitral valve is normal in structure. Moderate to severe mitral valve regurgitation. No evidence of mitral stenosis.  5. Tricuspid valve regurgitation is moderate.  6. The aortic valve is tricuspid. There is mild calcification of the aortic valve. There is mild thickening of the aortic valve. Aortic valve regurgitation is moderate. Aortic valve sclerosis is present, with no evidence of aortic valve stenosis.  7. The inferior vena cava is dilated in size with >50% respiratory variability, suggesting right atrial pressure of 8 mmHg. FINDINGS  Left Ventricle: Left ventricular ejection fraction, by estimation, is 25 to 30%. The left ventricle has severely decreased function. The left ventricle demonstrates global hypokinesis. Definity contrast agent was given IV to delineate the left ventricular endocardial borders. The left ventricular internal cavity size was moderately dilated. There is no left ventricular hypertrophy. Left ventricular diastolic parameters are consistent with Grade II diastolic dysfunction (pseudonormalization). Right Ventricle: The right ventricular size is normal. No increase in right ventricular wall thickness. Right ventricular systolic function is normal. Left Atrium: Left atrial size was  moderately dilated. Right Atrium: Right atrial size was normal in size. Pericardium: There is no evidence of pericardial effusion. Mitral Valve: The mitral valve is normal in structure. Moderate to severe mitral valve regurgitation. No evidence of mitral valve stenosis. Tricuspid Valve: The tricuspid valve is normal in structure. Tricuspid valve regurgitation is moderate . No evidence of tricuspid stenosis. Aortic Valve: The aortic valve is tricuspid. There is mild calcification of the aortic valve. There is mild thickening of the aortic valve. Aortic valve regurgitation is moderate. Aortic regurgitation PHT measures 225 msec. Aortic valve sclerosis is present, with no evidence of aortic valve stenosis. Aortic valve mean gradient measures 4.0 mmHg. Aortic valve peak gradient measures 6.2 mmHg. Aortic valve area, by VTI measures 1.95 cm. Pulmonic Valve: The pulmonic valve was normal in structure. Pulmonic valve regurgitation is not visualized. No evidence of pulmonic stenosis. Aorta: The aortic root is normal in size and structure. Venous: The inferior vena cava is dilated in size with greater than 50% respiratory variability, suggesting right atrial pressure of 8 mmHg. IAS/Shunts: No atrial level shunt detected by color flow Doppler.  LEFT VENTRICLE PLAX 2D LVIDd:         5.00 cm   Diastology LVIDs:         4.70 cm   LV e' medial:    7.40 cm/s LV PW:         1.10 cm   LV E/e' medial:  14.2 LV IVS:        1.00 cm   LV e' lateral:   18.40 cm/s LVOT diam:     1.90 cm   LV E/e' lateral: 5.7 LV SV:         33 LV SV Index:   20 LVOT Area:     2.84 cm  RIGHT VENTRICLE RV Basal diam:  3.80 cm RV Mid diam:    2.80 cm RV S prime:     10.20 cm/s TAPSE (M-mode): 1.2 cm LEFT ATRIUM             Index        RIGHT ATRIUM           Index LA diam:        4.70 cm 2.83 cm/m   RA Area:     20.50 cm LA Vol (A2C):   46.5 ml 27.98 ml/m  RA Volume:   58.60 ml  35.27 ml/m LA Vol (A4C):   43.2 ml 26.00 ml/m LA Biplane Vol: 45.1 ml  27.14 ml/m  AORTIC VALVE AV Area (Vmax):    2.17 cm AV Area (Vmean):   1.96 cm AV Area (VTI):     1.95 cm AV Vmax:           125.00 cm/s AV Vmean:          89.400 cm/s AV VTI:            0.169 m AV Peak Grad:  6.2 mmHg AV Mean Grad:      4.0 mmHg LVOT Vmax:         95.65 cm/s LVOT Vmean:        61.800 cm/s LVOT VTI:          0.116 m LVOT/AV VTI ratio: 0.69 AI PHT:            225 msec  AORTA Ao Root diam: 2.90 cm Ao Asc diam:  3.00 cm MITRAL VALVE                  TRICUSPID VALVE MV Area (PHT): 5.62 cm       TR Peak grad:   37.9 mmHg MV Decel Time: 135 msec       TR Vmax:        308.00 cm/s MR Peak grad:    88.7 mmHg MR Mean grad:    64.0 mmHg    SHUNTS MR Vmax:         471.00 cm/s  Systemic VTI:  0.12 m MR Vmean:        386.0 cm/s   Systemic Diam: 1.90 cm MR PISA:         1.57 cm MR PISA Eff ROA: 10 mm MR PISA Radius:  0.50 cm MV E velocity: 105.00 cm/s Charlton Haws MD Electronically signed by Charlton Haws MD Signature Date/Time: 06/27/2023/12:23:54 PM    Final      Pamella Pert, MD, PhD [facility]  Between 7 am - 7 pm I am available, please contact me via Amion (for emergencies) or Securechat (non urgent messages)  Between 7 pm - 7 am I am not available, please contact night coverage MD/APP via Amion

## 2023-06-28 NOTE — Progress Notes (Signed)
PHARMACY - ANTICOAGULATION CONSULT NOTE  Pharmacy Consult for heparin Indication: ACS  Allergies  Allergen Reactions   Sulfa Antibiotics Hives    Patient Measurements: Height: 5\' 3"  (160 cm) Weight: 63.5 kg (140 lb) IBW/kg (Calculated) : 52.4 Heparin Dosing Weight: 63.5 kg  Vital Signs: Temp: 98.6 F (37 C) (12/08 0125) Temp Source: Oral (12/08 0125) BP: 119/80 (12/08 0125) Pulse Rate: 100 (12/08 0400)  Labs: Recent Labs    06/26/23 2134 06/26/23 2333 06/27/23 0524 06/27/23 0613 06/27/23 0831 06/27/23 1514 06/28/23 0410  HGB 12.7  --   --   --  12.6  --  11.4*  HCT 39.1  --   --   --  42.9  --  36.1  PLT 155  --   --   --  135*  --  118*  APTT  --   --   --  47*  --   --   --   LABPROT  --   --   --  18.1*  --   --   --   INR  --   --   --  1.5*  --   --   --   HEPARINUNFRC  --   --   --  0.10*  --  <0.10* <0.10*  CREATININE 0.72  --   --   --  1.00  --   --   TROPONINIHS 1,168* 1,033* 1,067*  --   --   --   --     Estimated Creatinine Clearance: 60.4 mL/min (by C-G formula based on SCr of 1 mg/dL).  Assessment: 38 yoF presented to ED with SOB and cough. Pharmacy consulted to dose heparin for pulmonary embolism and ACS. -CBC WNL -Troponins 1168 > 1033> 1067 -no PTA anticoagulation -CTA: negative for acute PE, will continue heparin for ACS  Heparin level came back undetectable this AM. No issue with drip and level drawn appropriately. We will re-bolus and increase rate. Hgb dropped from 12.6 > 11.4 and plts 135 > 118.  Goal of Therapy:  Heparin level 0.3-0.7 units/ml Monitor platelets by anticoagulation protocol: Yes   Plan:  Give 1500 units bolus x 1 Increase heparin infusion at 1300 units/hr Check anti-Xa level in 8h and daily while on heparin Continue to monitor H&H and platelets  Arabella Merles, PharmD. Clinical Pharmacist 06/28/2023 5:22 AM

## 2023-06-28 NOTE — Plan of Care (Signed)
  Problem: Fluid Volume: Goal: Hemodynamic stability will improve Outcome: Progressing   Problem: Clinical Measurements: Goal: Diagnostic test results will improve Outcome: Progressing Goal: Signs and symptoms of infection will decrease Outcome: Progressing   Problem: Respiratory: Goal: Ability to maintain adequate ventilation will improve Outcome: Progressing   Problem: Education: Goal: Knowledge of General Education information will improve Description: Including pain rating scale, medication(s)/side effects and non-pharmacologic comfort measures Outcome: Progressing   Problem: Health Behavior/Discharge Planning: Goal: Ability to manage health-related needs will improve Outcome: Progressing   Problem: Clinical Measurements: Goal: Ability to maintain clinical measurements within normal limits will improve Outcome: Progressing Goal: Will remain free from infection Outcome: Progressing Goal: Diagnostic test results will improve Outcome: Progressing Goal: Respiratory complications will improve Outcome: Progressing Goal: Cardiovascular complication will be avoided Outcome: Progressing   Problem: Activity: Goal: Risk for activity intolerance will decrease Outcome: Progressing   Problem: Nutrition: Goal: Adequate nutrition will be maintained Outcome: Progressing   Problem: Coping: Goal: Level of anxiety will decrease Outcome: Progressing   Problem: Elimination: Goal: Will not experience complications related to bowel motility Outcome: Progressing Goal: Will not experience complications related to urinary retention Outcome: Progressing   Problem: Pain Management: Goal: General experience of comfort will improve Outcome: Progressing   Problem: Safety: Goal: Ability to remain free from injury will improve Outcome: Progressing   Problem: Skin Integrity: Goal: Risk for impaired skin integrity will decrease Outcome: Progressing   Problem: Education: Goal:  Ability to demonstrate management of disease process will improve Outcome: Progressing Goal: Ability to verbalize understanding of medication therapies will improve Outcome: Progressing Goal: Individualized Educational Video(s) Outcome: Progressing   Problem: Activity: Goal: Capacity to carry out activities will improve Outcome: Progressing   Problem: Cardiac: Goal: Ability to achieve and maintain adequate cardiopulmonary perfusion will improve Outcome: Progressing   Problem: Education: Goal: Understanding of CV disease, CV risk reduction, and recovery process will improve Outcome: Progressing Goal: Individualized Educational Video(s) Outcome: Progressing   Problem: Activity: Goal: Ability to return to baseline activity level will improve Outcome: Progressing   Problem: Cardiovascular: Goal: Ability to achieve and maintain adequate cardiovascular perfusion will improve Outcome: Progressing Goal: Vascular access site(s) Level 0-1 will be maintained Outcome: Progressing   Problem: Health Behavior/Discharge Planning: Goal: Ability to safely manage health-related needs after discharge will improve Outcome: Progressing

## 2023-06-28 NOTE — Progress Notes (Signed)
PHARMACY - ANTICOAGULATION CONSULT NOTE  Pharmacy Consult for heparin Indication: ACS  Allergies  Allergen Reactions   Sulfa Antibiotics Hives    Patient Measurements: Height: 5\' 3"  (160 cm) Weight: 63.5 kg (140 lb) IBW/kg (Calculated) : 52.4 Heparin Dosing Weight: 63.5 kg  Vital Signs: Temp: 97.9 F (36.6 C) (12/08 0545) Temp Source: Oral (12/08 0545) BP: 110/74 (12/08 0943) Pulse Rate: 102 (12/08 0943)  Labs: Recent Labs    06/26/23 2134 06/26/23 2333 06/27/23 0524 06/27/23 1610 06/27/23 0831 06/27/23 1514 06/28/23 0410 06/28/23 0847 06/28/23 1328  HGB 12.7  --   --   --  12.6  --  11.4*  --   --   HCT 39.1  --   --   --  42.9  --  36.1  --   --   PLT 155  --   --   --  135*  --  118*  --   --   APTT  --   --   --  47*  --   --   --   --   --   LABPROT  --   --   --  18.1*  --   --   --   --   --   INR  --   --   --  1.5*  --   --   --   --   --   HEPARINUNFRC  --   --   --  0.10*  --    < > <0.10* 0.12* 0.16*  CREATININE 0.72  --   --   --  1.00  --   --  1.08*  --   TROPONINIHS 1,168* 1,033* 1,067*  --   --   --   --   --   --    < > = values in this interval not displayed.    Estimated Creatinine Clearance: 55.9 mL/min (A) (by C-G formula based on SCr of 1.08 mg/dL (H)).  Assessment: 52 yo F presented to ED with SOB and cough. Pharmacy consulted to dose heparin for pulmonary embolism and ACS. -CBC WNL -Troponins 1168 > 1033> 1067 -no PTA anticoagulation -CTA: negative for acute PE, will continue heparin for ACS  Heparin level 0.16 (subtherapeutic) on 1300 units/hr.  Noted plans for cardiac cath 12/9.  Goal of Therapy:  Heparin level 0.3-0.7 units/ml Monitor platelets by anticoagulation protocol: Yes   Plan:  Increase heparin infusion to 1450 units/hr Check heparin level in 6h and daily while on heparin Continue to monitor H&H and platelets  [employer], Pharm.D., BCPS Clinical Pharmacist  **Pharmacist phone directory can be found on  [email or web address] listed under [facility].  06/28/2023 2:55 PM

## 2023-06-29 ENCOUNTER — Other Ambulatory Visit (HOSPITAL_COMMUNITY): Payer: Self-pay

## 2023-06-29 ENCOUNTER — Encounter (HOSPITAL_COMMUNITY): Payer: Self-pay | Admitting: Family Medicine

## 2023-06-29 ENCOUNTER — Encounter (HOSPITAL_COMMUNITY)
Admission: EM | Disposition: A | Discharge status: Home / Self Care | Payer: Self-pay | Source: Home / Self Care | Attending: Internal Medicine

## 2023-06-29 ENCOUNTER — Telehealth (HOSPITAL_COMMUNITY): Payer: Self-pay | Admitting: Pharmacy Technician

## 2023-06-29 DIAGNOSIS — I2510 Atherosclerotic heart disease of native coronary artery without angina pectoris: Secondary | ICD-10-CM

## 2023-06-29 DIAGNOSIS — I214 Non-ST elevation (NSTEMI) myocardial infarction: Secondary | ICD-10-CM | POA: Diagnosis not present

## 2023-06-29 HISTORY — PX: RIGHT/LEFT HEART CATH AND CORONARY ANGIOGRAPHY: CATH118266

## 2023-06-29 LAB — CBC
HCT: 39 % (ref 36.0–46.0)
Hemoglobin: 12.3 g/dL (ref 12.0–15.0)
MCH: 28.7 pg (ref 26.0–34.0)
MCHC: 31.5 g/dL (ref 30.0–36.0)
MCV: 90.9 fL (ref 80.0–100.0)
Platelets: 142 10*3/uL — ABNORMAL LOW (ref 150–400)
RBC: 4.29 MIL/uL (ref 3.87–5.11)
RDW: 14.6 % (ref 11.5–15.5)
WBC: 8.9 10*3/uL (ref 4.0–10.5)
nRBC: 0 % (ref 0.0–0.2)

## 2023-06-29 LAB — HEPATIC FUNCTION PANEL
ALT: 1312 U/L — ABNORMAL HIGH (ref 0–44)
AST: 657 U/L — ABNORMAL HIGH (ref 15–41)
Albumin: 2.5 g/dL — ABNORMAL LOW (ref 3.5–5.0)
Alkaline Phosphatase: 105 U/L (ref 38–126)
Bilirubin, Direct: 0.5 mg/dL — ABNORMAL HIGH (ref 0.0–0.2)
Indirect Bilirubin: 0.7 mg/dL (ref 0.3–0.9)
Total Bilirubin: 1.2 mg/dL — ABNORMAL HIGH (ref <1.2)
Total Protein: 5.2 g/dL — ABNORMAL LOW (ref 6.5–8.1)

## 2023-06-29 LAB — POCT I-STAT EG7
Acid-Base Excess: 3 mmol/L — ABNORMAL HIGH (ref 0.0–2.0)
Acid-Base Excess: 4 mmol/L — ABNORMAL HIGH (ref 0.0–2.0)
Acid-Base Excess: 1 mmol/L (ref 0.0–2.0)
Bicarbonate: 25.9 mmol/L (ref 20.0–28.0)
Bicarbonate: 28.7 mmol/L — ABNORMAL HIGH (ref 20.0–28.0)
Bicarbonate: 25.6 mmol/L (ref 20.0–28.0)
Calcium, Ion: 1.05 mmol/L — ABNORMAL LOW (ref 1.15–1.40)
Calcium, Ion: 1.07 mmol/L — ABNORMAL LOW (ref 1.15–1.40)
Calcium, Ion: 0.95 mmol/L — ABNORMAL LOW (ref 1.15–1.40)
HCT: 40 % (ref 36.0–46.0)
HCT: 40 % (ref 36.0–46.0)
HCT: 39 % (ref 36.0–46.0)
Hemoglobin: 13.6 g/dL (ref 12.0–15.0)
Hemoglobin: 13.6 g/dL (ref 12.0–15.0)
Hemoglobin: 13.3 g/dL (ref 12.0–15.0)
O2 Saturation: 59 %
O2 Saturation: 98 %
O2 Saturation: 68 %
Potassium: 3.7 mmol/L (ref 3.5–5.1)
Potassium: 3.8 mmol/L (ref 3.5–5.1)
Potassium: 3.4 mmol/L — ABNORMAL LOW (ref 3.5–5.1)
Sodium: 138 mmol/L (ref 135–145)
Sodium: 140 mmol/L (ref 135–145)
Sodium: 137 mmol/L (ref 135–145)
TCO2: 27 mmol/L (ref 22–32)
TCO2: 30 mmol/L (ref 22–32)
TCO2: 27 mmol/L (ref 22–32)
pCO2, Ven: 31.9 mm[Hg] — ABNORMAL LOW (ref 44–60)
pCO2, Ven: 40.8 mm[Hg] — ABNORMAL LOW (ref 44–60)
pCO2, Ven: 38.1 mm[Hg] — ABNORMAL LOW (ref 44–60)
pH, Ven: 7.456 — ABNORMAL HIGH (ref 7.25–7.43)
pH, Ven: 7.518 — ABNORMAL HIGH (ref 7.25–7.43)
pH, Ven: 7.436 — ABNORMAL HIGH (ref 7.25–7.43)
pO2, Ven: 29 mm[Hg] — CL (ref 32–45)
pO2, Ven: 87 mm[Hg] — ABNORMAL HIGH (ref 32–45)
pO2, Ven: 34 mm[Hg] (ref 32–45)

## 2023-06-29 LAB — LIPID PANEL
Cholesterol: 101 mg/dL (ref 0–200)
HDL: 20 mg/dL — ABNORMAL LOW (ref >40)
LDL Cholesterol: 64 mg/dL (ref 0–99)
Total CHOL/HDL Ratio: 5.1 {ratio}
Triglycerides: 85 mg/dL (ref <150)
VLDL: 17 mg/dL (ref 0–40)

## 2023-06-29 LAB — BASIC METABOLIC PANEL
Anion gap: 10 (ref 5–15)
BUN: 10 mg/dL (ref 6–20)
CO2: 25 mmol/L (ref 22–32)
Calcium: 7.8 mg/dL — ABNORMAL LOW (ref 8.9–10.3)
Chloride: 99 mmol/L (ref 98–111)
Creatinine, Ser: 0.82 mg/dL (ref 0.44–1.00)
GFR, Estimated: >60 mL/min (ref >60)
Glucose, Bld: 95 mg/dL (ref 70–99)
Potassium: 3.4 mmol/L — ABNORMAL LOW (ref 3.5–5.1)
Sodium: 134 mmol/L — ABNORMAL LOW (ref 135–145)

## 2023-06-29 LAB — CULTURE, OB URINE: Culture: NO GROWTH

## 2023-06-29 LAB — HEPARIN LEVEL (UNFRACTIONATED): Heparin Unfractionated: 0.32 [IU]/mL (ref 0.30–0.70)

## 2023-06-29 SURGERY — RIGHT/LEFT HEART CATH AND CORONARY ANGIOGRAPHY
Anesthesia: LOCAL

## 2023-06-29 MED ORDER — SODIUM CHLORIDE 0.9% FLUSH
3.0000 mL | Freq: Two times a day (BID) | INTRAVENOUS | Status: DC
  Administered 2023-06-30 (×2): 3 mL via INTRAVENOUS

## 2023-06-29 MED ORDER — SODIUM CHLORIDE 0.9% FLUSH: 3.0000 mL | INTRAVENOUS | Status: DC | PRN

## 2023-06-29 MED ORDER — IOHEXOL 350 MG/ML SOLN
INTRAVENOUS | Status: DC | PRN
  Administered 2023-06-29: 59 mL

## 2023-06-29 MED ORDER — HEPARIN SODIUM (PORCINE) 1000 UNIT/ML IJ SOLN
INTRAMUSCULAR | Status: DC | PRN
  Administered 2023-06-29: 5000 [IU] via INTRA_ARTERIAL

## 2023-06-29 MED ORDER — MIDAZOLAM HCL 2 MG/2ML IJ SOLN
INTRAMUSCULAR | Status: AC
  Filled 2023-06-29: qty 2

## 2023-06-29 MED ORDER — IPRATROPIUM-ALBUTEROL 0.5-2.5 (3) MG/3ML IN SOLN
3.0000 mL | Freq: Two times a day (BID) | RESPIRATORY_TRACT | Status: DC
Start: 2023-06-29 — End: 2023-07-01
  Administered 2023-06-29 – 2023-06-30 (×2): 3 mL via RESPIRATORY_TRACT
  Filled 2023-06-29 (×3): qty 3

## 2023-06-29 MED ORDER — VERAPAMIL HCL 2.5 MG/ML IV SOLN
INTRAVENOUS | Status: AC
  Filled 2023-06-29: qty 2

## 2023-06-29 MED ORDER — ACETAMINOPHEN 325 MG PO TABS
650.0000 mg | ORAL_TABLET | ORAL | Status: DC | PRN
  Administered 2023-06-30 (×3): 650 mg via ORAL
  Filled 2023-06-29 (×3): qty 2

## 2023-06-29 MED ORDER — VERAPAMIL HCL 2.5 MG/ML IV SOLN: INTRAVENOUS | Status: DC | PRN

## 2023-06-29 MED ORDER — HYDRALAZINE HCL 20 MG/ML IJ SOLN: 10.0000 mg | INTRAMUSCULAR | Status: AC | PRN

## 2023-06-29 MED ORDER — MIDAZOLAM HCL 2 MG/2ML IJ SOLN
INTRAMUSCULAR | Status: DC | PRN
  Administered 2023-06-29: 1 mg via INTRAVENOUS

## 2023-06-29 MED ORDER — SODIUM CHLORIDE 0.9 % IV SOLN: INTRAVENOUS | Status: DC

## 2023-06-29 MED ORDER — MAGNESIUM SULFATE 2 GM/50ML IV SOLN
2.0000 g | Freq: Once | INTRAVENOUS | Status: AC
  Administered 2023-06-29: 2 g via INTRAVENOUS
  Filled 2023-06-29: qty 50

## 2023-06-29 MED ORDER — IPRATROPIUM-ALBUTEROL 0.5-2.5 (3) MG/3ML IN SOLN
3.0000 mL | Freq: Four times a day (QID) | RESPIRATORY_TRACT | Status: DC
  Administered 2023-06-29: 3 mL via RESPIRATORY_TRACT
  Filled 2023-06-29: qty 3

## 2023-06-29 MED ORDER — SODIUM CHLORIDE 0.9 % IV SOLN: 250.0000 mL | INTRAVENOUS | Status: DC | PRN

## 2023-06-29 MED ORDER — FENTANYL CITRATE (PF) 100 MCG/2ML IJ SOLN
INTRAMUSCULAR | Status: DC | PRN
  Administered 2023-06-29: 25 ug via INTRAVENOUS

## 2023-06-29 MED ORDER — POTASSIUM CHLORIDE 20 MEQ PO PACK: 40.0000 meq | PACK | Freq: Once | ORAL | Status: DC

## 2023-06-29 MED ORDER — HEPARIN (PORCINE) IN NACL 1000-0.9 UT/500ML-% IV SOLN
INTRAVENOUS | Status: DC | PRN
  Administered 2023-06-29: 500 mL

## 2023-06-29 MED ORDER — HEPARIN SODIUM (PORCINE) 1000 UNIT/ML IJ SOLN
INTRAMUSCULAR | Status: AC
Start: 2023-06-29 — End: ?
  Filled 2023-06-29: qty 10

## 2023-06-29 MED ORDER — LIDOCAINE HCL (PF) 1 % IJ SOLN
INTRAMUSCULAR | Status: AC
  Filled 2023-06-29: qty 30

## 2023-06-29 MED ORDER — LABETALOL HCL 5 MG/ML IV SOLN: 10.0000 mg | INTRAVENOUS | Status: AC | PRN

## 2023-06-29 MED ORDER — LIDOCAINE HCL (PF) 1 % IJ SOLN
INTRAMUSCULAR | Status: DC | PRN
  Administered 2023-06-29: 5 mL
  Administered 2023-06-29: 2 mL

## 2023-06-29 MED ORDER — ONDANSETRON HCL 4 MG/2ML IJ SOLN
4.0000 mg | Freq: Four times a day (QID) | INTRAMUSCULAR | Status: DC | PRN
Start: 2023-06-29 — End: 2023-07-01

## 2023-06-29 MED ORDER — FENTANYL CITRATE (PF) 100 MCG/2ML IJ SOLN
INTRAMUSCULAR | Status: AC
  Filled 2023-06-29: qty 2

## 2023-06-29 MED ORDER — POTASSIUM CHLORIDE CRYS ER 20 MEQ PO TBCR
40.0000 meq | EXTENDED_RELEASE_TABLET | Freq: Two times a day (BID) | ORAL | Status: DC
  Administered 2023-06-29 – 2023-06-30 (×4): 40 meq via ORAL
  Filled 2023-06-29 (×5): qty 2

## 2023-06-29 SURGICAL SUPPLY — 13 items
CATH BALLN WEDGE 5F 110CM (CATHETERS) IMPLANT
CATH EXPO 5F MPA-1 (CATHETERS) IMPLANT
CATH INFINITI 5FR ANG PIGTAIL (CATHETERS) IMPLANT
CATH INFINITI AMBI 6FR TG (CATHETERS) IMPLANT
CATH INFINITI JR4 5F (CATHETERS) IMPLANT
GLIDESHEATH SLEND SS 6F .021 (SHEATH) IMPLANT
GUIDEWIRE .025 260CM (WIRE) IMPLANT
PACK CARDIAC CATHETERIZATION (CUSTOM PROCEDURE TRAY) ×1 IMPLANT
SET ATX-X65L (MISCELLANEOUS) IMPLANT
SHEATH PINNACLE 5F 10CM (SHEATH) IMPLANT
SHEATH PROBE COVER 6X72 (BAG) IMPLANT
WIRE EMERALD 3MM-J .035X260CM (WIRE) IMPLANT
WIRE MICRO SET SILHO 5FR 7 (SHEATH) IMPLANT

## 2023-06-29 NOTE — Progress Notes (Incomplete)
Heart Failure Nurse Navigator Progress Note  PCP: Patient, No Pcp Per PCP-Cardiologist: Nishan Admission Diagnosis: None Admitted from: Home  Presentation:   Jennifer King presented with shortness of breath, cough, x 1.5 weeks, BNP 1741, Troponin 1168, D-Dimer 4.55, CTA negative for PE, EKG showed sinus tachycardia, CXR with cardiomegaly, Plan  for Left and right heart cath 06/29/2023.   Patient and family were educated on the sign and symptoms of heart fialure, daily weights, when to call her doctor or go to the ED, Diet/ fluid restrictions, reported to drinking multiple Mt. Dews a day, and using extra salt on food, patient also reported to quitting smoking last month. Education continued on the importance of taking all medications as prescribed and attending all medical appointments. Patient verbalized her understanding, A HF TOC appointment was scheduled for 07/13/23 @ 10 am.    ECHO/ LVEF: 25-30%  Clinical Course:  History reviewed. No pertinent past medical history.   Social History   Socioeconomic History   Marital status: Married    Spouse name: Not on file   Number of children: Not on file   Years of education: Not on file   Highest education level: Not on file  Occupational History   Not on file  Tobacco Use   Smoking status: Former    Current packs/day: 0.50    Types: Cigarettes   Smokeless tobacco: Never  Substance and Sexual Activity   Alcohol use: Not Currently   Drug use: Not Currently   Sexual activity: Not on file  Other Topics Concern   Not on file  Social History Narrative   Not on file   Social Determinants of Health   Financial Resource Strain: Not on file  Food Insecurity: No Food Insecurity (06/27/2023)   Hunger Vital Sign    Worried About Running Out of Food in the Last Year: Never true    Ran Out of Food in the Last Year: Never true  Transportation Needs: No Transportation Needs (06/27/2023)   PRAPARE - Administrator, Civil Service  (Medical): No    Lack of Transportation (Non-Medical): No  Physical Activity: Not on file  Stress: No Stress Concern Present (05/16/2019)   Received from [facility], [facility] - Occupational Stress Questionnaire    Feeling of Stress : Not at all  Social Connections: Not on file   Education Assessment and Provision:  Detailed education and instructions provided on heart failure disease management including the following:  Signs and symptoms of Heart Failure When to call the physician Importance of daily weights Low sodium diet Fluid restriction Medication management Anticipated future follow-up appointments  Patient education given on each of the above topics.  Patient acknowledges understanding via teach back method and acceptance of all instructions.  Education Materials:  "Living Better With Heart Failure" Booklet, HF zone tool, & Daily Weight Tracker Tool.  Patient has scale at home: yes Patient has pill box at home: NA , will buy one    High Risk Criteria for Readmission and/or Poor Patient Outcomes: Heart failure hospital admissions (last 6 months): 0  No Show rate: 0 Difficult social situation: No, lives with husband and children Demonstrates medication adherence: Yes Primary Language: English Literacy level: Reading, writing, and comprehension  Barriers of Care:   Diet/ fluid restrictions ( soda/ salt) Daily weights Continued HF education   Considerations/Referrals:   Referral made to Heart Failure Pharmacist Stewardship: Yes Referral made to Heart  Failure CSW/NCM TOC: No Referral made to Heart & Vascular TOC clinic: Yes, 07/13/23 @ 10 am   Items for Follow-up on DC/TOC: Continued HF education Diet/ fluid restrictions ( salt, soda)  Daily weights   Rhae Hammock, BSN, RN Heart Failure Teacher, adult education Only

## 2023-06-29 NOTE — Telephone Encounter (Signed)
Patient Product/process development scientist completed.    The patient is insured through [facility]. Patient has [employer], may use a copay card, and/or apply for patient assistance if available.    Ran test claim for Entresto 24-26 mg and the current 30 day co-pay is $664.72 due to a Non Formulary Deductible.  Ran test claim for Farxiga 10 mg and the current 30 day co-pay is $0.00  Ran test claim for Jardiance 10 mg and the current 30 day co-pay is $0.00  This test claim was processed through [employer]- copay amounts may vary at other pharmacies due to [employer], or as the patient moves through the different stages of their insurance plan.     Roland Earl, CPHT Pharmacy Technician III Certified Patient Advocate [facility] Patient Advocate Team Direct Number: [phone]  Fax: [phone]

## 2023-06-29 NOTE — Progress Notes (Addendum)
   Patient Name: Jennifer King Date of Encounter: 06/29/2023 [facility] Cardiologist:  Eden Emms [facility] can follow)  Interval Summary  .    No CP. Prior long discussion with family., Sneezing at times.   Vital Signs .    Vitals:   06/28/23 2040 06/29/23 0346 06/29/23 0400 06/29/23 0817  BP: 99/70 114/82    Pulse: 96 97 97   Resp: 18 16    Temp: 98.5 F (36.9 C) 98.5 F (36.9 C)    TempSrc: Oral Oral    SpO2: 97% 97% 94% 94%  Weight:      Height:        Intake/Output Summary (Last 24 hours) at 06/29/2023 1055 Last data filed at 06/29/2023 0644 Gross per 24 hour  Intake 581.1 ml  Output 3000 ml  Net -2418.9 ml      06/26/2023    5:53 PM 06/04/2018   11:07 AM 01/24/2016    3:41 PM  Last 3 Weights  Weight (lbs) 140 lb 137 lb 130 lb  Weight (kg) 63.504 kg 62.143 kg 58.968 kg      Telemetry/ECG    8 beats NSVT otherwise NSR 80-90's- Personally Reviewed  Physical Exam .   GEN: No acute distress.   Neck: No JVD Cardiac: RRR, no murmurs, rubs, or gallops.  Respiratory: Clear to auscultation bilaterally. GI: Soft, nontender, non-distended  MS: No edema  Assessment & Plan .     52 year old NSTEMI (Trop 1100)  - R and L Cath today  - Severe MR  Acute systolic HF  - EF 25%. GDMT as able  - Lasix IV 40 BID potassium suppl. Creat 0.82  Tob use  - quit last month  For questions or updates, please contact [facility] Please consult [email or web address] for contact info under        Signed, Donato Schultz, MD

## 2023-06-29 NOTE — H&P (View-Only) (Signed)
   Patient Name: Jennifer King Date of Encounter: 06/29/2023 [facility] Cardiologist:  Eden Emms [facility] can follow)  Interval Summary  .    No CP. Prior long discussion with family., Sneezing at times.   Vital Signs .    Vitals:   06/28/23 2040 06/29/23 0346 06/29/23 0400 06/29/23 0817  BP: 99/70 114/82    Pulse: 96 97 97   Resp: 18 16    Temp: 98.5 F (36.9 C) 98.5 F (36.9 C)    TempSrc: Oral Oral    SpO2: 97% 97% 94% 94%  Weight:      Height:        Intake/Output Summary (Last 24 hours) at 06/29/2023 1055 Last data filed at 06/29/2023 0644 Gross per 24 hour  Intake 581.1 ml  Output 3000 ml  Net -2418.9 ml      06/26/2023    5:53 PM 06/04/2018   11:07 AM 01/24/2016    3:41 PM  Last 3 Weights  Weight (lbs) 140 lb 137 lb 130 lb  Weight (kg) 63.504 kg 62.143 kg 58.968 kg      Telemetry/ECG    8 beats NSVT otherwise NSR 80-90's- Personally Reviewed  Physical Exam .   GEN: No acute distress.   Neck: No JVD Cardiac: RRR, no murmurs, rubs, or gallops.  Respiratory: Clear to auscultation bilaterally. GI: Soft, nontender, non-distended  MS: No edema  Assessment & Plan .     52 year old NSTEMI (Trop 1100)  - R and L Cath today  - Severe MR  Acute systolic HF  - EF 25%. GDMT as able  - Lasix IV 40 BID potassium suppl. Creat 0.82  Tob use  - quit last month  For questions or updates, please contact [facility] Please consult [email or web address] for contact info under        Signed, Donato Schultz, MD

## 2023-06-29 NOTE — Progress Notes (Signed)
Transition of Care [facility]) - Inpatient Brief Assessment   Patient Details  Name: Jennifer King MRN: [id number] Date of Birth: [birth year]  Transition of Care [facility]) CM/SW Contact:    Ronny Bacon, RN Phone Number: 06/29/2023, 3:54 PM   Clinical Narrative: Patient from home with chest pain and shortness of breath. Patient for Cath today.   Transition of Care Asessment: Insurance and Status: (P) Insurance coverage has been reviewed   Home environment has been reviewed: (P) Home   Prior/Current Home Services: (P) No current home services Social Determinants of Health Reivew: (P) SDOH reviewed no interventions necessary Readmission risk has been reviewed: (P) Yes Transition of care needs: (P) no transition of care needs at this time

## 2023-06-29 NOTE — Interval H&P Note (Signed)
History and Physical Interval Note:  06/29/2023 11:17 AM  Jennifer King  has presented today for surgery, with the diagnosis of NSTEMI.  The various methods of treatment have been discussed with the patient and family. After consideration of risks, benefits and other options for treatment, the patient has consented to  Procedure(s): RIGHT/LEFT HEART CATH AND CORONARY ANGIOGRAPHY (N/A) as a surgical intervention.  The patient's history has been reviewed, patient examined, no change in status, stable for surgery.  I have reviewed the patient's chart and labs.  Questions were answered to the patient's satisfaction.     [name]

## 2023-06-29 NOTE — Progress Notes (Signed)
PHARMACY - ANTICOAGULATION CONSULT NOTE  Pharmacy Consult for heparin Indication: ACS  Allergies  Allergen Reactions   Sulfa Antibiotics Hives    Patient Measurements: Height: 5\' 3"  (160 cm) Weight: 63.5 kg (140 lb) IBW/kg (Calculated) : 52.4 Heparin Dosing Weight: 63.5 kg  Vital Signs: Temp: 98.5 F (36.9 C) (12/09 0346) Temp Source: Oral (12/09 0346) BP: 114/82 (12/09 0346) Pulse Rate: 97 (12/09 0400)  Labs: Recent Labs    06/26/23 2134 06/26/23 2333 06/27/23 0524 06/27/23 4540 06/27/23 0831 06/27/23 1514 06/28/23 0410 06/28/23 0847 06/28/23 1328 06/28/23 2129 06/29/23 0404 06/29/23 0452  HGB 12.7  --   --   --  12.6  --  11.4*  --   --   --  12.3  --   HCT 39.1  --   --   --  42.9  --  36.1  --   --   --  39.0  --   PLT 155  --   --   --  135*  --  118*  --   --   --  142*  --   APTT  --   --   --  47*  --   --   --   --   --   --   --   --   LABPROT  --   --   --  18.1*  --   --   --   --   --   --   --   --   INR  --   --   --  1.5*  --   --   --   --   --   --   --   --   HEPARINUNFRC  --   --   --  0.10*  --    < > <0.10* 0.12* 0.16* 0.30 0.32  --   CREATININE 0.72  --   --   --  1.00  --   --  1.08*  --   --   --  0.82  TROPONINIHS 1,168* 1,033* 1,067*  --   --   --   --   --   --   --   --   --    < > = values in this interval not displayed.    Estimated Creatinine Clearance: 73.6 mL/min (by C-G formula based on SCr of 0.82 mg/dL).  Assessment: 52 yo F presented to ED with SOB and cough. Pharmacy consulted to dose heparin for pulmonary embolism and ACS. -CBC WNL -no PTA anticoagulation -CTA: negative for acute PE, will continue heparin for ACS  Heparin level 0.32 increased slightly with small rate increase to 1500 units/hr.    Goal of Therapy:  Heparin level 0.3-0.7 units/ml Monitor platelets by anticoagulation protocol: Yes   Plan:  Continue heparin infusion at 1500 units/hr Check heparin level daily while on heparin Continue to monitor H&H  and platelets F/u after L/RHC today  Trixie Rude, PharmD Clinical Pharmacist 06/29/2023  7:12 AM

## 2023-06-29 NOTE — Plan of Care (Signed)
  Problem: Clinical Measurements: Goal: Diagnostic test results will improve Outcome: Progressing Goal: Signs and symptoms of infection will decrease Outcome: Progressing   Problem: Respiratory: Goal: Ability to maintain adequate ventilation will improve Outcome: Progressing   Problem: Education: Goal: Knowledge of General Education information will improve Description: Including pain rating scale, medication(s)/side effects and non-pharmacologic comfort measures Outcome: Progressing   Problem: Health Behavior/Discharge Planning: Goal: Ability to manage health-related needs will improve Outcome: Progressing   Problem: Clinical Measurements: Goal: Ability to maintain clinical measurements within normal limits will improve Outcome: Progressing Goal: Will remain free from infection Outcome: Progressing Goal: Diagnostic test results will improve Outcome: Progressing Goal: Respiratory complications will improve Outcome: Progressing Goal: Cardiovascular complication will be avoided Outcome: Progressing   Problem: Activity: Goal: Risk for activity intolerance will decrease Outcome: Progressing   Problem: Nutrition: Goal: Adequate nutrition will be maintained Outcome: Progressing   Problem: Coping: Goal: Level of anxiety will decrease Outcome: Not Progressing Note: Pt has been crying a lot today.

## 2023-06-29 NOTE — Progress Notes (Signed)
PROGRESS NOTE  Jennifer King OEV:[id number] DOB: Aug 02, 1970 DOA: 06/26/2023 PCP: Patient, No Pcp Per   LOS: 2 days   Brief Narrative / Interim history: 52 year old female prior tobacco use, quit about a month ago who comes into the hospital with sudden onset midsternal chest pain with dyspnea.  Feels like someone sitting on her chest.  Apparently about 3 weeks ago she was having URI symptoms, was given a Z-Pak but did not feel a whole lot better.  She was seen again afterwards and was given an antibiotic injection per her report.  Over the past week, she has experienced intermittent chest discomfort, coming and going as well as significant dyspnea with exertion, barely able to walk just a few feet without having to stop and catch her breath.  Workup in the ED concerning for non-STEMI she initially presented to [facility], and was transferred to [facility].  Apparently prior to transfer clinically seems to be worse with worsening respiratory status requiring BiPAP placement  Subjective / 24h Interval events: No chest pain, no shortness of breath.  Feeling well.  Awaiting cath  Assesement and Plan: Principal Problem:   NSTEMI (non-ST elevated myocardial infarction) [facility]) Active Problems:   Sepsis due to pneumonia (HCC)   Tobacco abuse  Principal problem Non-STEMI-with significant elevation of troponin, it is possible that she may have myocarditis following a URI.  Has been placed on heparin, continue.  Cardiology consulted, appreciate input -For cardiac catheterization today  Active problems Acute systolic CHF-2D echocardiogram showed LVEF 25 to 30%, global hypokinesis and grade 2 diastolic dysfunction.  RV was normal -Continue furosemide, GDMT per cardiology  Elevated LFTs-with concern for ischemic liver in the setting of acute CHF.  For completeness hepatitis panel was obtained and it was negative -LFTs improving today  Elevated lactic acid-would not repeat this further, she is  not septic appearing.  pH was normal on ABG on admission  Acute hypoxic respiratory failure-due to CHF.  Continue Lasix, appears to be on room air today  Possible pneumonia-on antibiotics, continue.  Procalcitonin minimally elevated at 0.12.  It was most likely fluid overload, cultures have remained negative, discontinue antibiotics  Tobacco use, in remission-quit a month ago  Scheduled Meds:  furosemide  40 mg Intravenous BID   guaiFENesin  600 mg Oral BID   ipratropium-albuterol  3 mL Nebulization Q6H WA   polyethylene glycol  17 g Oral Daily   potassium chloride  40 mEq Oral BID   Continuous Infusions:  sodium chloride 10 mL/hr at 06/29/23 0644   azithromycin 500 mg (06/29/23 0549)   cefTRIAXone (ROCEPHIN)  IV 2 g (06/29/23 0514)   heparin 1,500 Units/hr (06/29/23 0843)   PRN Meds:.chlorpheniramine-HYDROcodone, ipratropium-albuterol, magnesium hydroxide, morphine injection, nitroGLYCERIN  Current Outpatient Medications  Medication Instructions   JUNEL 1/20 1-20 MG-MCG tablet 1 TAB(S) ONCE A DAY ORALLY 84    Diet Orders (From admission, onward)     Start     Ordered   06/29/23 0001  Diet NPO time specified Except for: Sips with Meds  Diet effective midnight       Comments: NPO for solid foods after midnight, may have clear liquids until 5am, then NPO (this would be for inpatients and outpatients)  Question:  Except for  Answer:  Sips with Meds   06/28/23 1658            DVT prophylaxis:    Lab Results  Component Value Date   PLT 142 (L) 06/29/2023  Code Status: Not on file  Family Communication: No family at bedside  Status is: Inpatient Remains inpatient appropriate because: Severity of illness   Level of care: Progressive  Consultants:  Cardiology  Objective: Vitals:   06/28/23 2040 06/29/23 0346 06/29/23 0400 06/29/23 0817  BP: 99/70 114/82    Pulse: 96 97 97   Resp: 18 16    Temp: 98.5 F (36.9 C) 98.5 F (36.9 C)    TempSrc: Oral Oral     SpO2: 97% 97% 94% 94%  Weight:      Height:        Intake/Output Summary (Last 24 hours) at 06/29/2023 0906 Last data filed at 06/29/2023 0644 Gross per 24 hour  Intake 581.1 ml  Output 4400 ml  Net -3818.9 ml   Wt Readings from Last 3 Encounters:  06/26/23 63.5 kg  06/04/18 62.1 kg  01/24/16 59 kg    Examination:  Constitutional: NAD Eyes: lids and conjunctivae normal, no scleral icterus ENMT: mmm Neck: normal, supple Respiratory: clear to auscultation bilaterally, no wheezing, no crackles. Normal respiratory effort.  Cardiovascular: Regular rate and rhythm, no murmurs / rubs / gallops. No LE edema. Abdomen: soft, no distention, no tenderness. Bowel sounds positive.    Data Reviewed: I have independently reviewed following labs and imaging studies   CBC Recent Labs  Lab 06/26/23 2134 06/27/23 0831 06/28/23 0410 06/29/23 0404  WBC 11.4* 13.9* 13.1* 8.9  HGB 12.7 12.6 11.4* 12.3  HCT 39.1 42.9 36.1 39.0  PLT 155 135* 118* 142*  MCV 90.7 99.5 91.2 90.9  MCH 29.5 29.2 28.8 28.7  MCHC 32.5 29.4* 31.6 31.5  RDW 14.8 15.0 14.8 14.6  LYMPHSABS 2.8  --   --   --   MONOABS 0.7  --   --   --   EOSABS 0.1  --   --   --   BASOSABS 0.0  --   --   --     Recent Labs  Lab 06/26/23 2134 06/27/23 0613 06/27/23 0727 06/27/23 0831 06/28/23 0847 06/28/23 1017 06/29/23 0452  NA 135  --   --  136 135  --  134*  K 3.6  --   --  3.6 3.2*  --  3.4*  CL 105  --   --  103 100  --  99  CO2 18*  --   --  14* 23  --  25  GLUCOSE 100*  --   --  130* 118*  --  95  BUN 15  --   --  16 15  --  10  CREATININE 0.72  --   --  1.00 1.08*  --  0.82  CALCIUM 8.9  --   --  8.2* 7.8*  --  7.8*  AST  --   --   --   --  2,251*  --  657*  ALT  --   --   --   --  2,020*  --  1,312*  ALKPHOS  --   --   --   --  103  --  105  BILITOT  --   --   --   --  1.3*  --  1.2*  ALBUMIN  --   --   --   --  2.6*  --  2.5*  MG  --   --   --  2.2 1.7  --   --   DDIMER 4.55*  --   --   --   --   --    --  PROCALCITON  --   --  0.12  --   --   --   --   LATICACIDVEN  --  3.6*  --  8.2*  --  2.5*  --   INR  --  1.5*  --   --   --   --   --   BNP  --   --   --  1,741.0*  --   --   --     ------------------------------------------------------------------------------------------------------------------ No results for input(s): "CHOL", "HDL", "LDLCALC", "TRIG", "CHOLHDL", "LDLDIRECT" in the last 72 hours.  No results found for: "HGBA1C" ------------------------------------------------------------------------------------------------------------------ No results for input(s): "TSH", "T4TOTAL", "T3FREE", "THYROIDAB" in the last 72 hours.  Invalid input(s): "FREET3"  Cardiac Enzymes No results for input(s): "CKMB", "TROPONINI", "MYOGLOBIN" in the last 168 hours.  Invalid input(s): "CK" ------------------------------------------------------------------------------------------------------------------    Component Value Date/Time   BNP 1,741.0 (H) 06/27/2023 0831    CBG: No results for input(s): "GLUCAP" in the last 168 hours.  Recent Results (from the past 240 hour(s))  Resp panel by RT-PCR (RSV, Flu A&B, Covid) Anterior Nasal Swab     Status: None   Collection Time: 06/26/23  8:21 PM   Specimen: Anterior Nasal Swab  Result Value Ref Range Status   SARS Coronavirus 2 by RT PCR NEGATIVE NEGATIVE Final    Comment: (NOTE) SARS-CoV-2 target nucleic acids are NOT DETECTED.  The SARS-CoV-2 RNA is generally detectable in upper respiratory specimens during the acute phase of infection. The lowest concentration of SARS-CoV-2 viral copies this assay can detect is 138 copies/mL. A negative result does not preclude SARS-Cov-2 infection and should not be used as the sole basis for treatment or other patient management decisions. A negative result may occur with  improper specimen collection/handling, submission of specimen other than nasopharyngeal swab, presence of viral mutation(s) within  the areas targeted by this assay, and inadequate number of viral copies(<138 copies/mL). A negative result must be combined with clinical observations, patient history, and epidemiological information. The expected result is Negative.  Fact Sheet for Patients:  BloggerCourse.com  Fact Sheet for Healthcare Providers:  SeriousBroker.it  This test is no t yet approved or cleared by the [place] FDA and  has been authorized for detection and/or diagnosis of SARS-CoV-2 by FDA under an Emergency Use Authorization (EUA). This EUA will remain  in effect (meaning this test can be used) for the duration of the COVID-19 declaration under Section 564(b)(1) of the Act, 21 U.S.C.section 360bbb-3(b)(1), unless the authorization is terminated  or revoked sooner.       Influenza A by PCR NEGATIVE NEGATIVE Final   Influenza B by PCR NEGATIVE NEGATIVE Final    Comment: (NOTE) The Xpert Xpress SARS-CoV-2/FLU/RSV plus assay is intended as an aid in the diagnosis of influenza from Nasopharyngeal swab specimens and should not be used as a sole basis for treatment. Nasal washings and aspirates are unacceptable for Xpert Xpress SARS-CoV-2/FLU/RSV testing.  Fact Sheet for Patients: BloggerCourse.com  Fact Sheet for Healthcare Providers: SeriousBroker.it  This test is not yet approved or cleared by the [place] FDA and has been authorized for detection and/or diagnosis of SARS-CoV-2 by FDA under an Emergency Use Authorization (EUA). This EUA will remain in effect (meaning this test can be used) for the duration of the COVID-19 declaration under Section 564(b)(1) of the Act, 21 U.S.C. section 360bbb-3(b)(1), unless the authorization is terminated or revoked.     Resp Syncytial Virus by PCR NEGATIVE NEGATIVE Final  Comment: (NOTE) Fact Sheet for  Patients: BloggerCourse.com  Fact Sheet for Healthcare Providers: SeriousBroker.it  This test is not yet approved or cleared by the [place] FDA and has been authorized for detection and/or diagnosis of SARS-CoV-2 by FDA under an Emergency Use Authorization (EUA). This EUA will remain in effect (meaning this test can be used) for the duration of the COVID-19 declaration under Section 564(b)(1) of the Act, 21 U.S.C. section 360bbb-3(b)(1), unless the authorization is terminated or revoked.  Performed at [facility], [address] [place], [place] [address]   Culture, blood (x 2)     Status: None (Preliminary result)   Collection Time: 06/27/23  6:13 AM   Specimen: Right Antecubital; Blood  Result Value Ref Range Status   Specimen Description   Final    RIGHT ANTECUBITAL BOTTLES DRAWN AEROBIC AND ANAEROBIC   Special Requests Blood Culture adequate volume  Final   Culture   Final    NO GROWTH 2 DAYS Performed at [facility], [address] [place], [place] [address]    Report Status PENDING  Incomplete  Culture, blood (x 2)     Status: None (Preliminary result)   Collection Time: 06/27/23  6:13 AM   Specimen: Left Antecubital; Blood  Result Value Ref Range Status   Specimen Description   Final    LEFT ANTECUBITAL BOTTLES DRAWN AEROBIC AND ANAEROBIC   Special Requests Blood Culture adequate volume  Final   Culture   Final    NO GROWTH 2 DAYS Performed at [facility], [address], [place], [place] [address]    Report Status PENDING  Incomplete     Radiology Studies: No results found.   Pamella Pert, MD, PhD [facility]  Between 7 am - 7 pm I am available, please contact me via Amion (for emergencies) or Securechat (non urgent messages)  Between 7 pm - 7 am I am not available, please contact night coverage MD/APP via Amion

## 2023-06-29 NOTE — Progress Notes (Signed)
Pt has PRN BIPAP orders, no distress noted at this time.  °

## 2023-06-29 NOTE — Progress Notes (Addendum)
   Heart Failure Stewardship Pharmacist Progress Note   PCP: Patient, No Pcp Per PCP-Cardiologist: None    HPI:  52 yo F with PMH of tobacco use (quit 3 weeks prior).   Presented to the ED on 12/6 with shortness of breath and chest pain. Went to urgent care on 12/2 for nasal and sinus congestion, cough, low-grade fever, and sore throat. She was given a steroid shot and symptoms persisted. EKG with sinus tachycardia. CXR with cardiomegaly, central congestion, and pleural effusion. CTA negative for PE, concerning for pulmonary edema. BNP 1741 and trop 1168>>1067. Cardiology consulted for acute CHF and NSTEMI. ECHO 12/7 with LVEF 25-30%, global hypokinesis, G2DD, RV normal, moderate to severe MR. Scheduled for [facility] on 12/9.  Denies current chest pain or shortness of breath.   Current HF Medications: Diuretic: furosemide 40 mg IV BID  Prior to admission HF Medications: None  Pertinent Lab Values: Serum creatinine 0.82, BUN 10, Potassium 3.4, Sodium 134, BNP 1741, Magnesium 1.7 (12/8)  Vital Signs: Weight: 140 lbs (admission weight: 140 lbs) Blood pressure: 110/80s  Heart rate: 90-100s  I/O: net -3.5L yesterday; net -4.8L since admission  Medication Assistance / Insurance Benefits Check: Does the patient have prescription insurance?  Yes Type of insurance plan: [facility] commercial insurance  Outpatient Pharmacy:  [facility] Is the patient willing to use [facility]OC pharmacy at discharge? Yes Is the patient willing to transition their outpatient pharmacy to utilize a [facility] outpatient pharmacy?   No    Assessment: 1. Acute systolic CHF (LVEF 25-30%), pending ischemic evaluation. NYHA class III symptoms. - Continue furosemide 40 mg IV BID pending cath today. Strict I/Os and daily weights. Keep K>4 and Mg>2. Recommend KCl 40 mEq x 2 today. Recheck magnesium tomorrow. - Consider starting BB once more euvolemic - Further GDMT pending BP and renal  function after cath   Plan: 1) Medication changes recommended at this time: - KCl 40 mEq x 2 - Recheck magnesium tomorrow AM  2) Patient assistance: - Farxiga/Jardiance copay $0 - Entresto copay [phone] due to a non-formulary deductible - copay card lowers to $10  3)  Education  - Initial education completed  - Full education to be completed prior to discharge  Sharen Hones, PharmD, BCPS Heart Failure Engineer, building services Phone [phone]

## 2023-06-30 DIAGNOSIS — I214 Non-ST elevation (NSTEMI) myocardial infarction: Secondary | ICD-10-CM | POA: Diagnosis not present

## 2023-06-30 LAB — COMPREHENSIVE METABOLIC PANEL
ALT: 1021 U/L — ABNORMAL HIGH (ref 0–44)
AST: 231 U/L — ABNORMAL HIGH (ref 15–41)
Albumin: 2.7 g/dL — ABNORMAL LOW (ref 3.5–5.0)
Alkaline Phosphatase: 103 U/L (ref 38–126)
Anion gap: 9 (ref 5–15)
BUN: 12 mg/dL (ref 6–20)
CO2: 25 mmol/L (ref 22–32)
Calcium: 8.5 mg/dL — ABNORMAL LOW (ref 8.9–10.3)
Chloride: 99 mmol/L (ref 98–111)
Creatinine, Ser: 1.13 mg/dL — ABNORMAL HIGH (ref 0.44–1.00)
GFR, Estimated: 59 mL/min — ABNORMAL LOW (ref >60)
Glucose, Bld: 100 mg/dL — ABNORMAL HIGH (ref 70–99)
Potassium: 4.4 mmol/L (ref 3.5–5.1)
Sodium: 133 mmol/L — ABNORMAL LOW (ref 135–145)
Total Bilirubin: 1.6 mg/dL — ABNORMAL HIGH (ref <1.2)
Total Protein: 6.1 g/dL — ABNORMAL LOW (ref 6.5–8.1)

## 2023-06-30 LAB — CBC
HCT: 42.8 % (ref 36.0–46.0)
Hemoglobin: 13.4 g/dL (ref 12.0–15.0)
MCH: 28.3 pg (ref 26.0–34.0)
MCHC: 31.3 g/dL (ref 30.0–36.0)
MCV: 90.3 fL (ref 80.0–100.0)
Platelets: 168 10*3/uL (ref 150–400)
RBC: 4.74 MIL/uL (ref 3.87–5.11)
RDW: 14.6 % (ref 11.5–15.5)
WBC: 7.9 10*3/uL (ref 4.0–10.5)
nRBC: 0 % (ref 0.0–0.2)

## 2023-06-30 LAB — SEDIMENTATION RATE: Sed Rate: 5 mm/h (ref 0–22)

## 2023-06-30 LAB — MAGNESIUM: Magnesium: 2 mg/dL (ref 1.7–2.4)

## 2023-06-30 LAB — C-REACTIVE PROTEIN: CRP: 5.8 mg/dL — ABNORMAL HIGH (ref <1.0)

## 2023-06-30 MED ORDER — METOPROLOL SUCCINATE ER 25 MG PO TB24
25.0000 mg | ORAL_TABLET | Freq: Every day | ORAL | Status: DC
  Administered 2023-06-30 – 2023-07-01 (×2): 25 mg via ORAL
  Filled 2023-06-30 (×2): qty 1

## 2023-06-30 MED ORDER — EMPAGLIFLOZIN 10 MG PO TABS
10.0000 mg | ORAL_TABLET | Freq: Every day | ORAL | Status: DC
  Administered 2023-06-30 – 2023-07-01 (×2): 10 mg via ORAL
  Filled 2023-06-30 (×2): qty 1

## 2023-06-30 MED ORDER — ATORVASTATIN CALCIUM 80 MG PO TABS
80.0000 mg | ORAL_TABLET | Freq: Every day | ORAL | Status: DC
  Administered 2023-06-30 – 2023-07-01 (×2): 80 mg via ORAL
  Filled 2023-06-30 (×2): qty 1

## 2023-06-30 NOTE — Progress Notes (Signed)
PROGRESS NOTE  Kaylan Fahmy ZOX:[id number] DOB: July 14, 1971 DOA: 06/26/2023 PCP: Patient, No Pcp Per   LOS: 3 days   Brief Narrative / Interim history: 52 year old female prior tobacco use, quit about a month ago who comes into the hospital with sudden onset midsternal chest pain with dyspnea.  Feels like someone sitting on her chest.  Apparently about 3 weeks ago she was having URI symptoms, was given a Z-Pak but did not feel a whole lot better.  She was seen again afterwards and was given an antibiotic injection per her report.  Over the past week, she has experienced intermittent chest discomfort, coming and going as well as significant dyspnea with exertion, barely able to walk just a few feet without having to stop and catch her breath.  Workup in the ED concerning for non-STEMI she initially presented to [facility], and was transferred to [facility].  Apparently prior to transfer clinically seems to be worse with worsening respiratory status requiring BiPAP placement  Subjective / 24h Interval events: Feeling much better, no chest pain, no nausea/vomiting  Assesement and Plan: Principal Problem:   NSTEMI (non-ST elevated myocardial infarction) (HCC) Active Problems:   Sepsis due to pneumonia (HCC)   Tobacco abuse  Principal problem Non-STEMI-with significant elevation of troponin, it is possible that she may have myocarditis following a URI.  Has been placed on heparin, continue.  Cardiology consulted, appreciate input -underwent cardiac cath yesterday which did show CAD with a high-grade mid RCA lesion, felt to be treated with medical management and not stented.  LVEDP was high at 28, for now continue diuresis, discussed with cardiology.  Anticipate stable for discharge possibly on Wednesday  Active problems Acute systolic CHF-2D echocardiogram showed LVEF 25 to 30%, global hypokinesis and grade 2 diastolic dysfunction.  RV was normal -Continue furosemide, GDMT per  cardiology  Elevated LFTs-with concern for ischemic liver in the setting of acute CHF.  For completeness hepatitis panel was obtained and it was negative -LFTs improving  Elevated lactic acid-would not repeat this further, she is not septic appearing.  pH was normal on ABG on admission  Acute hypoxic respiratory failure-due to CHF.  Continue Lasix, appears to be on room air today  Possible pneumonia-on antibiotics, continue.  Procalcitonin minimally elevated at 0.12.  It was most likely fluid overload, cultures have remained negative, discontinue antibiotics  Tobacco use, in remission-quit a month ago  Scheduled Meds:  atorvastatin  80 mg Oral Daily   empagliflozin  10 mg Oral Daily   furosemide  40 mg Intravenous BID   guaiFENesin  600 mg Oral BID   ipratropium-albuterol  3 mL Nebulization BID   metoprolol succinate  25 mg Oral Daily   polyethylene glycol  17 g Oral Daily   potassium chloride  40 mEq Oral BID   sodium chloride flush  3 mL Intravenous Q12H   Continuous Infusions:   PRN Meds:.acetaminophen, chlorpheniramine-HYDROcodone, ipratropium-albuterol, magnesium hydroxide, morphine injection, nitroGLYCERIN, ondansetron (ZOFRAN) IV, sodium chloride flush  Current Outpatient Medications  Medication Instructions   JUNEL 1/20 1-20 MG-MCG tablet 1 TAB(S) ONCE A DAY ORALLY 84    Diet Orders (From admission, onward)     Start     Ordered   06/29/23 1324  Diet Heart Room service appropriate? Yes; Fluid consistency: Thin; Fluid restriction: 2000 mL Fluid  Diet effective now       Question Answer Comment  Room service appropriate? Yes   Fluid consistency: Thin   Fluid restriction: 2000 mL  Fluid      06/29/23 1323            DVT prophylaxis:    Lab Results  Component Value Date   PLT 168 06/30/2023      Code Status: Full Code  Family Communication: No family at bedside  Status is: Inpatient Remains inpatient appropriate because: Severity of  illness   Level of care: Progressive  Consultants:  Cardiology  Objective: Vitals:   06/30/23 0309 06/30/23 0757 06/30/23 0946 06/30/23 1304  BP: 107/71   106/74  Pulse: 98 89  (!) 107  Resp: 20 16  18   Temp: 98 F (36.7 C)   97.8 F (36.6 C)  TempSrc: Oral   Oral  SpO2: 98% 95%  100%  Weight:   60.6 kg   Height:        Intake/Output Summary (Last 24 hours) at 06/30/2023 1333 Last data filed at 06/30/2023 1200 Gross per 24 hour  Intake 960 ml  Output 3500 ml  Net -2540 ml   Wt Readings from Last 3 Encounters:  06/30/23 60.6 kg  06/04/18 62.1 kg  01/24/16 59 kg    Examination:  Constitutional: NAD Eyes: lids and conjunctivae normal, no scleral icterus ENMT: mmm Neck: normal, supple Respiratory: clear to auscultation bilaterally, no wheezing, no crackles. Normal respiratory effort.  Cardiovascular: Regular rate and rhythm, no murmurs / rubs / gallops. No LE edema. Abdomen: soft, no distention, no tenderness. Bowel sounds positive.    Data Reviewed: I have independently reviewed following labs and imaging studies   CBC Recent Labs  Lab 06/26/23 2134 06/27/23 0831 06/28/23 0410 06/29/23 0404 06/29/23 1240 06/29/23 1242 06/29/23 1248 06/30/23 0418  WBC 11.4* 13.9* 13.1* 8.9  --   --   --  7.9  HGB 12.7 12.6 11.4* 12.3 13.6 13.3 13.6 13.4  HCT 39.1 42.9 36.1 39.0 40.0 39.0 40.0 42.8  PLT 155 135* 118* 142*  --   --   --  168  MCV 90.7 99.5 91.2 90.9  --   --   --  90.3  MCH 29.5 29.2 28.8 28.7  --   --   --  28.3  MCHC 32.5 29.4* 31.6 31.5  --   --   --  31.3  RDW 14.8 15.0 14.8 14.6  --   --   --  14.6  LYMPHSABS 2.8  --   --   --   --   --   --   --   MONOABS 0.7  --   --   --   --   --   --   --   EOSABS 0.1  --   --   --   --   --   --   --   BASOSABS 0.0  --   --   --   --   --   --   --     Recent Labs  Lab 06/26/23 2134 06/27/23 0613 06/27/23 0727 06/27/23 0831 06/28/23 0847 06/28/23 1017 06/29/23 0452 06/29/23 1240 06/29/23 1242  06/29/23 1248 06/30/23 0418 06/30/23 0811  NA 135  --   --  136 135  --  134* 138 137 140 133*  --   K 3.6  --   --  3.6 3.2*  --  3.4* 3.8 3.4* 3.7 4.4  --   CL 105  --   --  103 100  --  99  --   --   --  99  --   CO2 18*  --   --  14* 23  --  25  --   --   --  25  --   GLUCOSE 100*  --   --  130* 118*  --  95  --   --   --  100*  --   BUN 15  --   --  16 15  --  10  --   --   --  12  --   CREATININE 0.72  --   --  1.00 1.08*  --  0.82  --   --   --  1.13*  --   CALCIUM 8.9  --   --  8.2* 7.8*  --  7.8*  --   --   --  8.5*  --   AST  --   --   --   --  2,251*  --  657*  --   --   --  231*  --   ALT  --   --   --   --  2,020*  --  1,312*  --   --   --  1,021*  --   ALKPHOS  --   --   --   --  103  --  105  --   --   --  103  --   BILITOT  --   --   --   --  1.3*  --  1.2*  --   --   --  1.6*  --   ALBUMIN  --   --   --   --  2.6*  --  2.5*  --   --   --  2.7*  --   MG  --   --   --  2.2 1.7  --   --   --   --   --  2.0  --   CRP  --   --   --   --   --   --   --   --   --   --   --  5.8*  DDIMER 4.55*  --   --   --   --   --   --   --   --   --   --   --   PROCALCITON  --   --  0.12  --   --   --   --   --   --   --   --   --   LATICACIDVEN  --  3.6*  --  8.2*  --  2.5*  --   --   --   --   --   --   INR  --  1.5*  --   --   --   --   --   --   --   --   --   --   BNP  --   --   --  1,741.0*  --   --   --   --   --   --   --   --     ------------------------------------------------------------------------------------------------------------------ Recent Labs    06/29/23 0452  CHOL 101  HDL 20*  LDLCALC 64  TRIG 85  CHOLHDL 5.1    No results found for: "HGBA1C" ------------------------------------------------------------------------------------------------------------------ No results for input(s): "TSH", "T4TOTAL", "T3FREE", "THYROIDAB" in the last 72 hours.  Invalid input(s): "FREET3"  Cardiac Enzymes No results for input(s): "CKMB", "TROPONINI", "MYOGLOBIN" in the last  168 hours.  Invalid input(s): "CK" ------------------------------------------------------------------------------------------------------------------    Component Value Date/Time   BNP 1,741.0 (H) 06/27/2023 0831    CBG: No results for input(s): "GLUCAP" in the last 168 hours.  Recent Results (from the past 240 hour(s))  Resp panel by RT-PCR (RSV, Flu A&B, Covid) Anterior Nasal Swab     Status: None   Collection Time: 06/26/23  8:21 PM   Specimen: Anterior Nasal Swab  Result Value Ref Range Status   SARS Coronavirus 2 by RT PCR NEGATIVE NEGATIVE Final    Comment: (NOTE) SARS-CoV-2 target nucleic acids are NOT DETECTED.  The SARS-CoV-2 RNA is generally detectable in upper respiratory specimens during the acute phase of infection. The lowest concentration of SARS-CoV-2 viral copies this assay can detect is 138 copies/mL. A negative result does not preclude SARS-Cov-2 infection and should not be used as the sole basis for treatment or other patient management decisions. A negative result may occur with  improper specimen collection/handling, submission of specimen other than nasopharyngeal swab, presence of viral mutation(s) within the areas targeted by this assay, and inadequate number of viral copies(<138 copies/mL). A negative result must be combined with clinical observations, patient history, and epidemiological information. The expected result is Negative.  Fact Sheet for Patients:  BloggerCourse.com  Fact Sheet for Healthcare Providers:  SeriousBroker.it  This test is no t yet approved or cleared by the [place] FDA and  has been authorized for detection and/or diagnosis of SARS-CoV-2 by FDA under an Emergency Use Authorization (EUA). This EUA will remain  in effect (meaning this test can be used) for the duration of the COVID-19 declaration under Section 564(b)(1) of the Act, 21 U.S.C.section 360bbb-3(b)(1),  unless the authorization is terminated  or revoked sooner.       Influenza A by PCR NEGATIVE NEGATIVE Final   Influenza B by PCR NEGATIVE NEGATIVE Final    Comment: (NOTE) The Xpert Xpress SARS-CoV-2/FLU/RSV plus assay is intended as an aid in the diagnosis of influenza from Nasopharyngeal swab specimens and should not be used as a sole basis for treatment. Nasal washings and aspirates are unacceptable for Xpert Xpress SARS-CoV-2/FLU/RSV testing.  Fact Sheet for Patients: BloggerCourse.com  Fact Sheet for Healthcare Providers: SeriousBroker.it  This test is not yet approved or cleared by the [place] FDA and has been authorized for detection and/or diagnosis of SARS-CoV-2 by FDA under an Emergency Use Authorization (EUA). This EUA will remain in effect (meaning this test can be used) for the duration of the COVID-19 declaration under Section 564(b)(1) of the Act, 21 U.S.C. section 360bbb-3(b)(1), unless the authorization is terminated or revoked.     Resp Syncytial Virus by PCR NEGATIVE NEGATIVE Final    Comment: (NOTE) Fact Sheet for Patients: BloggerCourse.com  Fact Sheet for Healthcare Providers: SeriousBroker.it  This test is not yet approved or cleared by the [place] FDA and has been authorized for detection and/or diagnosis of SARS-CoV-2 by FDA under an Emergency Use Authorization (EUA). This EUA will remain in effect (meaning this test can be used) for the duration of the COVID-19 declaration under Section 564(b)(1) of the Act, 21 U.S.C. section 360bbb-3(b)(1), unless the authorization is terminated or revoked.  Performed at [facility], [address], [place], [place] [address]   Culture, blood (x 2)     Status: None (Preliminary result)   Collection Time: 06/27/23  6:13 AM   Specimen: Right Antecubital; Blood  Result Value Ref Range Status    Specimen Description   Final    RIGHT ANTECUBITAL BOTTLES DRAWN AEROBIC AND ANAEROBIC   Special Requests Blood Culture adequate volume  Final   Culture   Final    NO GROWTH 3 DAYS Performed at [facility], [address] [place], [place] [address]    Report Status PENDING  Incomplete  Culture, blood (x 2)     Status: None (Preliminary result)   Collection Time: 06/27/23  6:13 AM   Specimen: Left Antecubital; Blood  Result Value Ref Range Status   Specimen Description   Final    LEFT ANTECUBITAL BOTTLES DRAWN AEROBIC AND ANAEROBIC   Special Requests Blood Culture adequate volume  Final   Culture   Final    NO GROWTH 3 DAYS Performed at  [facility], [address], [place], [place] [address]    Report Status PENDING  Incomplete  Culture, OB Urine     Status: None   Collection Time: 06/27/23 10:54 AM   Specimen: Urine, Random  Result Value Ref Range Status   Specimen Description URINE, RANDOM  Final   Special Requests NONE  Final   Culture   Final    NO GROWTH NO GROUP B STREP (S.AGALACTIAE) ISOLATED Performed at [facility], [address], [place], [place] [address]    Report Status 06/29/2023 FINAL  Final     Radiology Studies: No results found.   Pamella Pert, MD, PhD [facility]  Between 7 am - 7 pm I am available, please contact me via Amion (for emergencies) or Securechat (non urgent messages)  Between 7 pm - 7 am I am not available, please contact night coverage MD/APP via Amion

## 2023-06-30 NOTE — Progress Notes (Signed)
CARDIAC REHAB PHASE I   PRE:  Rate/Rhythm: 97 NSR  BP:  Sitting: 115/80      SpO2: 97 RA  MODE:  Ambulation: 470 ft    POST:  Rate/Rhythm: 107 ST  BP:  Sitting: 110/92      SpO2: 97 RA  Pt ambulated with supervision assistance, she denied experiencing atypical symptoms during ambulation. Pt was returned to room and educated on with family present.   Pt was educated on NSTEMI and HF, wt restrictions, no baths/daily wash-ups, s/s of infection, ex guidelines, s/s to stop exercising, NTG use and calling 911, heart healthy diet, risk factors daily weights, low na diet, and CRPII. Pt received materials on exercise, diet, and CRPII. Will refer to AP.    Faustino Congress  MS, ACSM-CEP 9:20 AM 06/30/2023    Service time is from 0830 to 0910.

## 2023-06-30 NOTE — Progress Notes (Signed)
   Heart Failure Stewardship Pharmacist Progress Note   PCP: Patient, No Pcp Per PCP-Cardiologist: None    HPI:  52 yo F with PMH of tobacco use (quit 3 weeks prior).   Presented to the ED on 12/6 with shortness of breath and chest pain. Went to urgent care on 12/2 for nasal and sinus congestion, cough, low-grade fever, and sore throat. She was given a steroid shot and symptoms persisted. EKG with sinus tachycardia. CXR with cardiomegaly, central congestion, and pleural effusion. CTA negative for PE, concerning for pulmonary edema. BNP 1741 and trop 1168>>1067. Cardiology consulted for acute CHF and NSTEMI. ECHO 12/7 with LVEF 25-30%, global hypokinesis, G2DD, RV normal, moderate to severe MR.   R/LHC on 12/9 showed mild mid LAD disease and high grade mid RCA lesion that is diffusely diseased with an occluded branch. RA 15, PA 36, wedge 24, CO/CI 4.4/2.7. Recommending medical management for NICM.  Denies current chest pain or shortness of breath.   Current HF Medications: Diuretic: furosemide 40 mg IV BID Beta Blocker: metoprolol XL 25 mg daily SGLT2i: Jardiance 10 mg daily  Prior to admission HF Medications: None  Pertinent Lab Values: Serum creatinine 1.13, BUN 12, Potassium 4.4, Sodium 133, BNP 1741, Magnesium 2.0  Vital Signs: Weight: 133 lbs (admission weight: 140 lbs) Blood pressure: 100-110/80s  Heart rate: 80-90s I/O: net -2.5L yesterday; net -6.3L since admission  Medication Assistance / Insurance Benefits Check: Does the patient have prescription insurance?  Yes Type of insurance plan: [facility] commercial insurance  Outpatient Pharmacy:  [facility] the patient willing to use [facility] pharmacy at discharge? Yes Is the patient willing to transition their outpatient pharmacy to utilize a [facility] outpatient pharmacy?   No    Assessment: 1. Acute systolic CHF (LVEF 25-30%), due to NICM. NYHA class III symptoms. - On furosemide 40 mg  IV BID, volume status improving. May be close to transitioning to PO. Strict I/Os and daily weights. Keep K>4 and Mg>2. KCl 40 mEq BID while on IV lasix.  - Agree with starting metoprolol XL 25 mg daily - Consider starting Entresto vs spironolactone prior to discharge - Agree with starting Jardiance 10 mg daily   Plan: 1) Medication changes recommended at this time: - Agree with changes - Add Entresto vs spironolactone prior to discharge  2) Patient assistance: - Farxiga/Jardiance copay $0 Sherryll Burger copay [phone] due to a non-formulary deductible - copay card lowers to $10  3)  Education  - Initial education completed  - Full education to be completed prior to discharge  Sharen Hones, PharmD, BCPS Heart Failure Engineer, building services Phone [phone]

## 2023-06-30 NOTE — Progress Notes (Addendum)
Patient Name: Jennifer King Date of Encounter: 06/30/2023 [facility] Cardiologist: None   Interval Summary  .    No chest pain or shortness of breath.  Feels back at baseline with no complaints  Vital Signs .    Vitals:   06/29/23 1521 06/29/23 2011 06/29/23 2042 06/30/23 0309  BP: 100/72 104/72  107/71  Pulse: 91 95  98  Resp: 20 16  20   Temp: 98 F (36.7 C) 98.4 F (36.9 C)  98 F (36.7 C)  TempSrc: Oral Oral  Oral  SpO2: 94% 95% 96% 98%  Weight:      Height:        Intake/Output Summary (Last 24 hours) at 06/30/2023 0755 Last data filed at 06/30/2023 1610 Gross per 24 hour  Intake 360 ml  Output 2500 ml  Net -2140 ml      06/26/2023    5:53 PM 06/04/2018   11:07 AM 01/24/2016    3:41 PM  Last 3 Weights  Weight (lbs) 140 lb 137 lb 130 lb  Weight (kg) 63.504 kg 62.143 kg 58.968 kg      Telemetry/ECG    Sinus rhythm heart rates in the 80s.  Episode of NSVT 4 seconds yesterday- Personally Reviewed  CV Studies    Right and left heart catheterization 06/29/2023   Prox RCA to Mid RCA lesion is 99% stenosed.   Mid RCA to Dist RCA lesion is 80% stenosed.   RPAV lesion is 100% stenosed.   Dist RCA lesion is 60% stenosed.   Mid LAD lesion is 20% stenosed.   1.  Mild mid LAD disease. 2.  High-grade mid right coronary artery lesion that is diffusely diseased with an occluded right posterolateral ventricular branch.  Given the lack of reproducible angina in the unclear benefit of revascularization in this setting, medical therapy will be pursued.  If the patient develops anginal symptoms that are refractory to medical therapy then PCI could be considered. 3.  Fick cardiac output of 4.4 L/min and Fick cardiac index of 2.7 L/min/m with the following hemodynamics:             Right atrial pressure mean of 15 mmHg             Right ventricular pressure 47/10 with an end-diastolic pressure of 19 mmHg             Wedge pressure mean of 24 mmHg with V waves to 31  mmHg             PA pressure 48/27 with a mean of 36 mmHg             Pulmonary vascular resistance of 2.7 Woods units             PA pulsatility index of 1.4 4.  LVEDP of 28 mmHg 5.  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   Recommendation: Medical therapy for nonischemic cardiomyopathy with coincident coronary artery disease.  Echocardiogram 06/27/2023 1. Left ventricular ejection fraction, by estimation, is 25 to 30%. The  left ventricle has severely decreased function. The left ventricle  demonstrates global hypokinesis. The left ventricular internal cavity size  was moderately dilated. Left  ventricular diastolic parameters are consistent with Grade II diastolic  dysfunction (pseudonormalization).   2. Right ventricular systolic function is normal. The right ventricular  size is normal.   3. Left atrial size was moderately dilated.   4. The mitral valve is normal in structure. Moderate to  severe mitral  valve regurgitation. No evidence of mitral stenosis.   5. Tricuspid valve regurgitation is moderate.   6. The aortic valve is tricuspid. There is mild calcification of the  aortic valve. There is mild thickening of the aortic valve. Aortic valve  regurgitation is moderate. Aortic valve sclerosis is present, with no  evidence of aortic valve stenosis.   7. The inferior vena cava is dilated in size with >50% respiratory  variability, suggesting right atrial pressure of 8 mmHg.     Physical Exam .   GEN: No acute distress.   Neck: No JVD Cardiac: RRR, no murmurs, rubs, or gallops.  Respiratory: Clear to auscultation bilaterally. GI: Soft, nontender, non-distended  MS: No edema  Right ulnar and right neck insertion sites free of any acute erythema, discharge, tenderness/fluctuance.  Patient Profile   Averee Hochhalter is a 52 y.o. female has hx of prior nicotine dependence and admitted on 06/26/2023 for the evaluation of NSTEMI.  Assessment & Plan .      NSTEMI She has been complaining of viral symptoms since 06/22/2023.  Having chest pressure when laying flat and relieved when leaning forward, nonpleuritic.  EKG showed no acute ST-T wave changes.  Trops have been flat and around 1100.  She underwent right left heart catheterization that showed high-grade mid right RCA lesion with occluded right posterolateral ventricular branch, with mild mid LAD disease.  Given lack of reproducible angina and unclear benefit of revascularization, medical therapy was elected.  She would have continued symptoms refractory to medical therapy PCI could also be considered again. Will start aspirin 81 mg, Toprol-XL 25 mg, check lipid panel and start atorvastatin 80 mg Check ESR/CRP, with viral symptoms/URI symptoms seem consistent with possible myocarditis.    New onset HFrEF Nonischemic cardiomyopathy Moderate to severe MR/moderate TR Echo showed EF 25 to 30% with global hypokinesis, grade 2 diastolic dysfunction with normal RV function.  Coincident CAD as noted above.  Right heart cath showed: Pulmonary hypertension, normal CO. Clinically she looks euvolemic, but with elevated LVEDP will continue with diuresis.   Right atrial pressure mean of 15 mmHg Right ventricular pressure 47/10 with an end-diastolic pressure of 19 mmHg Wedge pressure mean of 24 mmHg with V waves to 31 mmHg PA pressure 48/27 with a mean of 36 mmHg Pulmonary vascular resistance of 2.7 Woods units PA pulsatility index of 1.4 LVEDP of 28 mmHg Defer to MD whether or not to continue IV diuretics.  She is on IV Lasix 40 mg twice daily Start Jardiance 10 mg and Toprol-XL 25 mg.  Blood pressure tends to be soft, uptitrate as tolerated Will need follow-up echocardiogram in 2 to 3 months Discussed need for daily weights and ongoing CHF management Will obtain weight today  Pulmonary hypertension Likely related to nicotine dependence, she has been a chronic smoker since the age of 41 and recently quit  1 month ago.  She snores and has elevated Mallampati score.  Likely needs sleep study outpatient.  Sepsis secondary to pneumonia Elevated LFTs Resolving.  Had a lactic as high as 8.4 on admission and downtrending.  Unclear etiology of this as it was reported that she did not appear septic based off labs met criteria.    For questions or updates, please contact [facility] Please consult [email or web address] for contact info under        Signed, Abagail Kitchens, PA-C    Personally seen and examined. Agree with above.  Continue with IV Lasix 40 mg  twice daily.  We are using this because LVEDP was 28 mmHg yesterday. After today IV diuresis, will likely be able to be discharged tomorrow. -Jardiance 10 mg a day -Toprol 25 mg a day -If able, add low-dose Entresto as outpatient.  Also low-dose spironolactone as outpatient.  Donato Schultz, MD

## 2023-06-30 NOTE — Plan of Care (Signed)
  Problem: Fluid Volume: Goal: Hemodynamic stability will improve Outcome: Progressing   Problem: Clinical Measurements: Goal: Diagnostic test results will improve Outcome: Progressing Goal: Signs and symptoms of infection will decrease Outcome: Progressing   Problem: Respiratory: Goal: Ability to maintain adequate ventilation will improve Outcome: Progressing   Problem: Education: Goal: Knowledge of General Education information will improve Description: Including pain rating scale, medication(s)/side effects and non-pharmacologic comfort measures Outcome: Progressing   Problem: Health Behavior/Discharge Planning: Goal: Ability to manage health-related needs will improve Outcome: Progressing   Problem: Clinical Measurements: Goal: Ability to maintain clinical measurements within normal limits will improve Outcome: Progressing Goal: Will remain free from infection Outcome: Progressing Goal: Diagnostic test results will improve Outcome: Progressing Goal: Respiratory complications will improve Outcome: Progressing Goal: Cardiovascular complication will be avoided Outcome: Progressing   Problem: Activity: Goal: Risk for activity intolerance will decrease Outcome: Progressing   Problem: Nutrition: Goal: Adequate nutrition will be maintained Outcome: Progressing   Problem: Coping: Goal: Level of anxiety will decrease Outcome: Progressing   Problem: Elimination: Goal: Will not experience complications related to bowel motility Outcome: Progressing Goal: Will not experience complications related to urinary retention Outcome: Progressing   Problem: Pain Management: Goal: General experience of comfort will improve Outcome: Progressing   Problem: Safety: Goal: Ability to remain free from injury will improve Outcome: Progressing   Problem: Skin Integrity: Goal: Risk for impaired skin integrity will decrease Outcome: Progressing   Problem: Education: Goal:  Ability to demonstrate management of disease process will improve Outcome: Progressing Goal: Ability to verbalize understanding of medication therapies will improve Outcome: Progressing Goal: Individualized Educational Video(s) Outcome: Progressing   Problem: Activity: Goal: Capacity to carry out activities will improve Outcome: Progressing   Problem: Cardiac: Goal: Ability to achieve and maintain adequate cardiopulmonary perfusion will improve Outcome: Progressing   Problem: Education: Goal: Understanding of CV disease, CV risk reduction, and recovery process will improve Outcome: Progressing Goal: Individualized Educational Video(s) Outcome: Progressing   Problem: Activity: Goal: Ability to return to baseline activity level will improve Outcome: Progressing   Problem: Cardiovascular: Goal: Ability to achieve and maintain adequate cardiovascular perfusion will improve Outcome: Progressing Goal: Vascular access site(s) Level 0-1 will be maintained Outcome: Progressing   Problem: Health Behavior/Discharge Planning: Goal: Ability to safely manage health-related needs after discharge will improve Outcome: Progressing   Problem: Cardiac: Goal: Ability to achieve and maintain adequate cardiopulmonary perfusion will improve Outcome: Progressing

## 2023-07-01 ENCOUNTER — Other Ambulatory Visit (HOSPITAL_COMMUNITY): Payer: Self-pay

## 2023-07-01 DIAGNOSIS — I214 Non-ST elevation (NSTEMI) myocardial infarction: Secondary | ICD-10-CM | POA: Diagnosis not present

## 2023-07-01 LAB — CBC
HCT: 47 % — ABNORMAL HIGH (ref 36.0–46.0)
Hemoglobin: 14.8 g/dL (ref 12.0–15.0)
MCH: 28.2 pg (ref 26.0–34.0)
MCHC: 31.5 g/dL (ref 30.0–36.0)
MCV: 89.7 fL (ref 80.0–100.0)
Platelets: 194 10*3/uL (ref 150–400)
RBC: 5.24 MIL/uL — ABNORMAL HIGH (ref 3.87–5.11)
RDW: 14.7 % (ref 11.5–15.5)
WBC: 7.6 10*3/uL (ref 4.0–10.5)
nRBC: 0 % (ref 0.0–0.2)

## 2023-07-01 LAB — COMPREHENSIVE METABOLIC PANEL
ALT: 734 U/L — ABNORMAL HIGH (ref 0–44)
AST: 108 U/L — ABNORMAL HIGH (ref 15–41)
Albumin: 3 g/dL — ABNORMAL LOW (ref 3.5–5.0)
Alkaline Phosphatase: 110 U/L (ref 38–126)
Anion gap: 9 (ref 5–15)
BUN: 16 mg/dL (ref 6–20)
CO2: 22 mmol/L (ref 22–32)
Calcium: 9 mg/dL (ref 8.9–10.3)
Chloride: 100 mmol/L (ref 98–111)
Creatinine, Ser: 0.94 mg/dL (ref 0.44–1.00)
GFR, Estimated: >60 mL/min (ref >60)
Glucose, Bld: 95 mg/dL (ref 70–99)
Potassium: 4.5 mmol/L (ref 3.5–5.1)
Sodium: 131 mmol/L — ABNORMAL LOW (ref 135–145)
Total Bilirubin: 1.3 mg/dL — ABNORMAL HIGH (ref <1.2)
Total Protein: 6.5 g/dL (ref 6.5–8.1)

## 2023-07-01 LAB — LIPID PANEL
Cholesterol: 155 mg/dL (ref 0–200)
HDL: 33 mg/dL — ABNORMAL LOW (ref >40)
LDL Cholesterol: 102 mg/dL — ABNORMAL HIGH (ref 0–99)
Total CHOL/HDL Ratio: 4.7 {ratio}
Triglycerides: 98 mg/dL (ref <150)
VLDL: 20 mg/dL (ref 0–40)

## 2023-07-01 LAB — LIPOPROTEIN A (LPA): Lipoprotein (a): 42.2 nmol/L — ABNORMAL HIGH (ref <75.0)

## 2023-07-01 LAB — MAGNESIUM: Magnesium: 2.1 mg/dL (ref 1.7–2.4)

## 2023-07-01 MED ORDER — EMPAGLIFLOZIN 10 MG PO TABS
10.0000 mg | ORAL_TABLET | Freq: Every day | ORAL | 0 refills | Status: DC
  Filled 2023-07-01: qty 30, 30d supply, fill #0

## 2023-07-01 MED ORDER — ATORVASTATIN CALCIUM 80 MG PO TABS
80.0000 mg | ORAL_TABLET | Freq: Every day | ORAL | 0 refills | Status: DC
  Filled 2023-07-01: qty 30, 30d supply, fill #0

## 2023-07-01 MED ORDER — SACUBITRIL-VALSARTAN 24-26 MG PO TABS
1.0000 | ORAL_TABLET | Freq: Two times a day (BID) | ORAL | Status: DC
  Administered 2023-07-01: 1 via ORAL
  Filled 2023-07-01: qty 1

## 2023-07-01 MED ORDER — SACUBITRIL-VALSARTAN 24-26 MG PO TABS
1.0000 | ORAL_TABLET | Freq: Two times a day (BID) | ORAL | 0 refills | Status: DC
  Filled 2023-07-01: qty 60, 30d supply, fill #0

## 2023-07-01 MED ORDER — METOPROLOL SUCCINATE ER 25 MG PO TB24
25.0000 mg | ORAL_TABLET | Freq: Every day | ORAL | 0 refills | Status: DC
  Filled 2023-07-01: qty 30, 30d supply, fill #0

## 2023-07-01 MED ORDER — ASPIRIN 81 MG PO TBEC
81.0000 mg | DELAYED_RELEASE_TABLET | Freq: Every day | ORAL | 11 refills | Status: AC
  Filled 2023-07-01: qty 30, 30d supply, fill #0

## 2023-07-01 NOTE — Care Management (Addendum)
Spoke w patient at bedside, she states that she has not yet recieved Entresto copay card. I have provided her with one. TOC pharmacy is in her preferred list, I anticipate initial fill with 30 day trail will be sent through here before DC.  Patient will call [facility] nurse line to find a local PCP that is accepting new patients

## 2023-07-01 NOTE — Progress Notes (Addendum)
Patient Name: Jennifer King Date of Encounter: 07/01/2023 [facility] Cardiologist: None   Interval Summary  .    No chest pain or shortness of breath.  States that her breathing does not feel as congested. Vital Signs .    Vitals:   06/30/23 0946 06/30/23 1304 06/30/23 1946 07/01/23 0400  BP:  106/74 113/79 120/79  Pulse:  (!) 107 95 93  Resp:  18 18 18   Temp:  97.8 F (36.6 C) 99 F (37.2 C) 98.4 F (36.9 C)  TempSrc:  Oral Oral Oral  SpO2:  100% 98% 98%  Weight: 60.6 kg   59.3 kg  Height:        Intake/Output Summary (Last 24 hours) at 07/01/2023 0800 Last data filed at 06/30/2023 1700 Gross per 24 hour  Intake 840 ml  Output 1000 ml  Net -160 ml      07/01/2023    4:00 AM 06/30/2023    9:46 AM 06/26/2023    5:53 PM  Last 3 Weights  Weight (lbs) 130 lb 12.8 oz 133 lb 8 oz 140 lb  Weight (kg) 59.33 kg 60.555 kg 63.504 kg      Telemetry/ECG    Sinus rhythm heart rates in the 80s.  Episode of SVT 8 sec - Personally Reviewed  CV Studies    Right and left heart catheterization 06/29/2023   Prox RCA to Mid RCA lesion is 99% stenosed.   Mid RCA to Dist RCA lesion is 80% stenosed.   RPAV lesion is 100% stenosed.   Dist RCA lesion is 60% stenosed.   Mid LAD lesion is 20% stenosed.   1.  Mild mid LAD disease. 2.  High-grade mid right coronary artery lesion that is diffusely diseased with an occluded right posterolateral ventricular branch.  Given the lack of reproducible angina in the unclear benefit of revascularization in this setting, medical therapy will be pursued.  If the patient develops anginal symptoms that are refractory to medical therapy then PCI could be considered. 3.  Fick cardiac output of 4.4 L/min and Fick cardiac index of 2.7 L/min/m with the following hemodynamics:             Right atrial pressure mean of 15 mmHg             Right ventricular pressure 47/10 with an end-diastolic pressure of 19 mmHg             Wedge pressure mean  of 24 mmHg with V waves to 31 mmHg             PA pressure 48/27 with a mean of 36 mmHg             Pulmonary vascular resistance of 2.7 Woods units             PA pulsatility index of 1.4 4.  LVEDP of 28 mmHg 5.  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   Recommendation: Medical therapy for nonischemic cardiomyopathy with coincident coronary artery disease.  Echocardiogram 06/27/2023 1. Left ventricular ejection fraction, by estimation, is 25 to 30%. The  left ventricle has severely decreased function. The left ventricle  demonstrates global hypokinesis. The left ventricular internal cavity size  was moderately dilated. Left  ventricular diastolic parameters are consistent with Grade II diastolic  dysfunction (pseudonormalization).   2. Right ventricular systolic function is normal. The right ventricular  size is normal.   3. Left atrial size was moderately dilated.   4.  The mitral valve is normal in structure. Moderate to severe mitral  valve regurgitation. No evidence of mitral stenosis.   5. Tricuspid valve regurgitation is moderate.   6. The aortic valve is tricuspid. There is mild calcification of the  aortic valve. There is mild thickening of the aortic valve. Aortic valve  regurgitation is moderate. Aortic valve sclerosis is present, with no  evidence of aortic valve stenosis.   7. The inferior vena cava is dilated in size with >50% respiratory  variability, suggesting right atrial pressure of 8 mmHg.     Physical Exam .   GEN: No acute distress.   Neck: No JVD Cardiac: RRR, no murmurs, rubs, or gallops.  Respiratory: Clear to auscultation bilaterally. GI: Soft, nontender, non-distended  MS: No edema  Right ulnar and right neck insertion sites free of any acute erythema, discharge, tenderness/fluctuance.  Patient Profile   Jennifer King is a 52 y.o. female has hx of prior nicotine dependence and admitted on 06/26/2023 for the evaluation of  NSTEMI.  Assessment & Plan .     NSTEMI She has been complaining of viral symptoms since 06/22/2023.  Having chest pressure when laying flat and relieved when leaning forward, nonpleuritic.  EKG showed no acute ST-T wave changes.  Trops have been flat and around 1100.  She underwent right left heart catheterization that showed high-grade mid right RCA lesion with occluded right posterolateral ventricular branch, with mild mid LAD disease.  Given lack of reproducible angina and unclear benefit of revascularization, medical therapy was elected.  She would have continued symptoms refractory to medical therapy PCI could also be considered again. Continue aspirin 81 mg, Toprol-XL 25 mg, and atorvastatin 80 mg.  CRP 5.8, normal ESR.  At this point with no further complaints of chest pain and minimally elevated labs defer to MD whether this warrants colchicine and NSAIDs.  New onset HFrEF Nonischemic cardiomyopathy Moderate to severe MR/moderate TR Echo showed EF 25 to 30% with global hypokinesis, grade 2 diastolic dysfunction with normal RV function.  Coincident CAD as noted above.  Right heart cath showed: Pulmonary hypertension, normal CO. Euvolemic  Right atrial pressure mean of 15 mmHg Right ventricular pressure 47/10 with an end-diastolic pressure of 19 mmHg Wedge pressure mean of 24 mmHg with V waves to 31 mmHg PA pressure 48/27 with a mean of 36 mmHg Pulmonary vascular resistance of 2.7 Woods units PA pulsatility index of 1.4 LVEDP of 28 mmHg Continue Jardiance 10 mg and Toprol-XL 25 mg.  Start Entresto 24-26mg  BID.  She has instructions to check blood pressure twice a day and to bring to upcoming. Defer initiation of spiro outpatient given borderline potassium levels here. Stopping IV lasix, can likely DC on PRN. Will need follow-up echocardiogram in 2 to 3 months Discussed need for daily weights and ongoing CHF management Discharge weight 130.8  Pulmonary hypertension Likely related to  nicotine dependence, she has been a chronic smoker since the age of 83 and recently quit 1 month ago.  She snores and has elevated Mallampati score.  Likely needs sleep study outpatient.  Sepsis secondary to pneumonia Elevated LFTs Resolving.  Had a lactic as high as 8.4 on admission and downtrending.  Unclear etiology of this as it was reported that she did not appear septic based off labs met criteria.  Hyperlipidemia LDL 102.  Goal less than 55.  Continue statin as above and recheck lipid panel in 6 to 8 weeks.   Likely can discharge.  Has appointment with  heart failure clinic 07/13/2023, check labs.  Will also schedule her follow-up with our clinic to establish care approximately 1 month from today.     For questions or updates, please contact [facility] Please consult [email or web address] for contact info under        Signed, Abagail Kitchens, PA-C    Personally seen and examined. Agree with above.  52 year old with acute systolic heart failure both ischemic/nonischemic with EF 25 to 30%, CTO like lesion in mid right coronary artery with otherwise minimal disease. -Lets avoid NSAIDs at this time.  Also lets avoid colchicine as well. -Continuing Jardiance 10 mg Toprol-XL 25 mg, Entresto will be started 24/26 twice daily.  We will hold off on spironolactone given borderline potassiums.  Stopping Lasix. Remember, wedge was 24 mmHg, LVEDP was 28 mmHg.  Okay with DC home at this point.  We will have close follow up.  Donato Schultz, MD

## 2023-07-01 NOTE — Progress Notes (Signed)
CARDIAC REHAB PHASE I   Pt ready for discharge home. Post MI education completed. Referral for CRP2 sent to [facility].  Woodroe Chen, RN BSN 07/01/2023 2:39 PM

## 2023-07-01 NOTE — Progress Notes (Signed)
   Heart Failure Stewardship Pharmacist Progress Note   PCP: Patient, No Pcp Per PCP-Cardiologist: None    HPI:  52 yo F with PMH of tobacco use (quit 3 weeks prior).   Presented to the ED on 12/6 with shortness of breath and chest pain. Went to urgent care on 12/2 for nasal and sinus congestion, cough, low-grade fever, and sore throat. She was given a steroid shot and symptoms persisted. EKG with sinus tachycardia. CXR with cardiomegaly, central congestion, and pleural effusion. CTA negative for PE, concerning for pulmonary edema. BNP 1741 and trop 1168>>1067. Cardiology consulted for acute CHF and NSTEMI. ECHO 12/7 with LVEF 25-30%, global hypokinesis, G2DD, RV normal, moderate to severe MR.   R/LHC on 12/9 showed mild mid LAD disease and high grade mid RCA lesion that is diffusely diseased with an occluded branch. RA 15, PA 36, wedge 24, CO/CI 4.4/2.7. Recommending medical management for NICM.  Denies current chest pain or shortness of breath. Ready to go home.   Current HF Medications: Beta Blocker: metoprolol XL 25 mg daily ACE/ARB/ARNI: Entresto 24/26 mg BID SGLT2i: Jardiance 10 mg daily  Prior to admission HF Medications: None  Pertinent Lab Values: Serum creatinine 0.94, BUN 16, Potassium 4.5, Sodium 131, BNP 1741, Magnesium 2.1  Vital Signs: Weight: 130 lbs (admission weight: 140 lbs) Blood pressure: 110-120/80s  Heart rate: 70-90s I/O: net -0.4L yesterday; net -6.9L since admission  Medication Assistance / Insurance Benefits Check: Does the patient have prescription insurance?  Yes Type of insurance plan: [facility] commercial insurance  Outpatient Pharmacy:  Prior to admission outpatient pharmacy: [facility] Is the patient willing to use [facility] TOC pharmacy at discharge? Yes Is the patient willing to transition their outpatient pharmacy to utilize a [facility] outpatient pharmacy?   No    Assessment: 1. Acute systolic CHF (LVEF 25-30%), due to NICM. NYHA class III symptoms. -  Volume status improved, off IV lasix. Strict I/Os and daily weights. Keep K>4 and Mg>2. KCl 40 mEq BID while on IV lasix - still ordered.  - Continue metoprolol XL 25 mg daily - Agree with starting Entresto 24/26 mg BID today - Consider adding spironolactone prior to discharge vs at follow up - Continue Jardiance 10 mg daily   Plan: 1) Medication changes recommended at this time: - Stop KCl  2) Patient assistance: - Farxiga/Jardiance copay $0 Sherryll Burger copay [phone] due to a non-formulary deductible - copay card lowers to $10. Card provided by [facility] today.   3)  Education  - Patient has been educated on current HF medications and potential additions to HF medication regimen - Patient verbalizes understanding that over the next few months, these medication doses may change and more medications may be added to optimize HF regimen - Patient has been educated on basic disease state pathophysiology and goals of therapy   Sharen Hones, PharmD, BCPS Heart Failure Stewardship Pharmacist Phone [phone]

## 2023-07-01 NOTE — Discharge Summary (Signed)
Physician Discharge Summary  Jennifer King KGU:[id number] DOB: 09/26/1970 DOA: 06/26/2023  PCP: Patient, No Pcp Per  Admit date: 06/26/2023 Discharge date: 07/01/2023  Time spent: 40 minutes  Recommendations for Outpatient Follow-up:  Follow outpatient CBC/CMP  Follow with cardiology outpatient for HF/CAD Needs repeat LFT's outpatient, resume statin once LFT's normalize (workup abnormal LFT's additionally as needed) Repeat CXR outpatient in 2-3 weeks STOP OCP, discuss options with gyn  Discharge Diagnoses:  Principal Problem:   NSTEMI (non-ST elevated myocardial infarction) (HCC) Active Problems:   Sepsis due to pneumonia (HCC)   Tobacco abuse   Discharge Condition: stable  Diet recommendation: heart healthy  Filed Weights   06/26/23 1753 06/30/23 0946 07/01/23 0400  Weight: 63.5 kg 60.6 kg 59.3 kg    History of present illness:   52 year old female prior tobacco use, quit about Shadd Dunstan month ago who comes into the hospital with sudden onset midsternal chest pain with dyspnea. Feels like someone sitting on her chest. Apparently about 3 weeks ago she was having URI symptoms, was given Jaimi Belle Z-Pak but did not feel Aalliyah Kilker whole lot better. She was seen again afterwards and was given an antibiotic injection per her report. Over the past week, she has experienced intermittent chest discomfort, coming and going as well as significant dyspnea with exertion, barely able to walk just Carlia Bomkamp few feet without having to stop and catch her breath. Workup in the ED concerning for non-STEMI she initially presented to [facility], and was transferred to [facility].   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  Has been diuresed.  Stable for discharge.  See below for additional details   Hospital Course:  Assessment and Plan:  Non-STEMI-with significant elevation of troponin, it is possible that she may have myocarditis  following Breckyn Troyer URI.  Has been placed on heparin, continue.  Cardiology consulted, appreciate input -underwent cardiac cath which showed mild mid LAD disease and high grade mid right coronary artery lesion diffusely diseased with an occluded right posterolateral ventricular branch - planning for medical therapy with lack of reproducible angina, consider PCI if symptoms refractory to medical therapy -appreciate cardiology recs -> discharging on aspirin, metoprolol (lipitor currently on hold with elevated LFT's)   Acute systolic CHF 2D echocardiogram showed LVEF 25 to 30%, global hypokinesis and grade 2 diastolic dysfunction.  RV was normal Appreciate final cardiology recs -> entresto, metoprolol, jardiance -> has planned follow up with HF team and cardiology   Elevated LFTs-with concern for ischemic liver in the setting of acute CHF.  For completeness hepatitis panel was obtained and it was negative -LFTs improving -resume statin once LFT's normalize outpatient   Elevated lactic acid   Acute hypoxic respiratory failure- resolved, due to heart failure - see above   Possible pneumonia-on antibiotics, continue.  Procalcitonin minimally elevated at 0.12.  It was most likely fluid overload, cultures have remained negative, discontinue antibiotics.  Follow CXR in 2-3 weeks outpatient.     Tobacco use, in remission-quit Ricky Gallery month ago  On OCP's -> in the setting of NSTEMI above and CAD on cath -> recommended stopping     Procedures: Echo IMPRESSIONS     1. Left ventricular ejection fraction, by estimation, is 25 to 30%. The  left ventricle has severely decreased function. The left ventricle  demonstrates global hypokinesis. The left ventricular internal cavity size  was moderately dilated. Left  ventricular diastolic parameters are consistent with Grade II  diastolic  dysfunction (pseudonormalization).   2. Right ventricular systolic function is normal. The right ventricular  size is normal.    3. Left atrial size was moderately dilated.   4. The mitral valve is normal in structure. Moderate to severe mitral  valve regurgitation. No evidence of mitral stenosis.   5. Tricuspid valve regurgitation is moderate.   6. The aortic valve is tricuspid. There is mild calcification of the  aortic valve. There is mild thickening of the aortic valve. Aortic valve  regurgitation is moderate. Aortic valve sclerosis is present, with no  evidence of aortic valve stenosis.   7. The inferior vena cava is dilated in size with >50% respiratory  variability, suggesting right atrial pressure of 8 mmHg.    R/LHC   Prox RCA to Mid RCA lesion is 99% stenosed.   Mid RCA to Dist RCA lesion is 80% stenosed.   RPAV lesion is 100% stenosed.   Dist RCA lesion is 60% stenosed.   Mid LAD lesion is 20% stenosed.   1.  Mild mid LAD disease. 2.  High-grade mid right coronary artery lesion that is diffusely diseased with an occluded right posterolateral ventricular branch.  Given the lack of reproducible angina in the unclear benefit of revascularization in this setting, medical therapy will be pursued.  If the patient develops anginal symptoms that are refractory to medical therapy then PCI could be considered. 3.  Fick cardiac output of 4.4 L/min and Fick cardiac index of 2.7 L/min/m with the following hemodynamics:             Right atrial pressure mean of 15 mmHg             Right ventricular pressure 47/10 with an end-diastolic pressure of 19 mmHg             Wedge pressure mean of 24 mmHg with V waves to 31 mmHg             PA pressure 48/27 with Aalaysia Liggins mean of 36 mmHg             Pulmonary vascular resistance of 2.7 Woods units             PA pulsatility index of 1.4 4.  LVEDP of 28 mmHg 5.  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   Recommendation: Medical therapy for nonischemic cardiomyopathy with coincident coronary artery disease.  Consultations: cardiology  Discharge  Exam: Vitals:   06/30/23 1946 07/01/23 0400  BP: 113/79 120/79  Pulse: 95 93  Resp: 18 18  Temp: 99 F (37.2 C) 98.4 F (36.9 C)  SpO2: 98% 98%   Eager to discharge Husband at bedside  General: No acute distress. Cardiovascular: RRR Lungs: Clear to auscultation bilaterally  Abdomen: Soft, nontender, nondistended with normal active bowel sounds. No masses. No hepatosplenomegaly. Neurological: Alert and oriented 3. Moves all extremities 4 with equal strength. Cranial nerves II through XII grossly intact. Extremities: No clubbing or cyanosis. No edema.   Discharge Instructions   Discharge Instructions     Amb Referral to Cardiac Rehabilitation   Complete by: As directed    Diagnosis: NSTEMI   After initial evaluation and assessments completed: Virtual Based Care may be provided alone or in conjunction with Phase 2 Cardiac Rehab based on patient barriers.: Yes   Intensive Cardiac Rehabilitation (ICR) [facility] location only OR Traditional Cardiac Rehabilitation (TCR) *If criteria for ICR are not met will enroll in TCR [facility] only): Yes   Call  MD for:  difficulty breathing, headache or visual disturbances   Complete by: As directed    Call MD for:  extreme fatigue   Complete by: As directed    Call MD for:  hives   Complete by: As directed    Call MD for:  persistant dizziness or light-headedness   Complete by: As directed    Call MD for:  persistant nausea and vomiting   Complete by: As directed    Call MD for:  redness, tenderness, or signs of infection (pain, swelling, redness, odor or green/yellow discharge around incision site)   Complete by: As directed    Call MD for:  severe uncontrolled pain   Complete by: As directed    Call MD for:  temperature >100.4   Complete by: As directed    Diet - low sodium heart healthy   Complete by: As directed    Discharge instructions   Complete by: As directed    You were seen for an NSTEMI (heart attack).  You had Austin Pongratz catheterization  and were found to have coronary artery disease which will be treated medically.    We've started you on aspirin, lipitor, metoprolol.  You'll need to follow up with cardiology for continued follow up.  I'm going to hold the lipitor until you follow up with cardiology or your PCP outpatient for repeat liver labs (your liver function tests are abnormal).  You also have heart failure (non ischemic cardiomyopathy).  For this, you've been started on jardiance, entresto, and metoprolol.  Follow up with cardiology outpatient for dose adjustments to the entresto.  You have follow up with the heart failure clinic on 07/13/2023.  They'll check labs at that time.  Stop your birth control pill given your coronary artery disease.   Repeat chest imaging with your PCP outpatient within 2-3 weeks.   You should also follow with with Telma Pyeatt PCP outpatient.   Return for new, recurrent, or worsening symptoms.  Please ask your PCP to request records from this hospitalization so they know what was done and what the next steps will be.   Increase activity slowly   Complete by: As directed       Allergies as of 07/01/2023       Reactions   Sulfa Antibiotics Hives        Medication List     STOP taking these medications    Junel 1/20 1-20 MG-MCG tablet Generic drug: norethindrone-ethinyl estradiol       TAKE these medications    aspirin EC 81 MG tablet Take 1 tablet (81 mg total) by mouth daily. Swallow whole.   empagliflozin 10 MG Tabs tablet Commonly known as: JARDIANCE Take 1 tablet (10 mg total) by mouth daily. Start taking on: July 02, 2023   metoprolol succinate 25 MG 24 hr tablet Commonly known as: TOPROL-XL Take 1 tablet (25 mg total) by mouth daily. Start taking on: July 02, 2023   sacubitril-valsartan 24-26 MG Commonly known as: ENTRESTO Take 1 tablet by mouth 2 (two) times daily. Follow up with cardiology for refills       Allergies  Allergen Reactions   Sulfa  Antibiotics Hives    Follow-up Information     [facility]. Go in 13 day(s).   Specialty: Cardiology Why: Hospital follow up 07/13/23 @ 10 am PLEASE bring Fani Rotondo current medication List to appointment FREE valet parking, Entrance C, off [employer] information: [address]  [facility] [place] [address] [phone]        Sharlene Dory, PA-C Follow up.   Specialty: Cardiology Why: Thursday Aug 06, 2023 Arrive by 8:10 AM Appt at 8:25 AM (25 min) Contact information: [address] [place] [address] [phone]                  The results of significant diagnostics from this hospitalization (including imaging, microbiology, ancillary and laboratory) are listed below for reference.    Significant Diagnostic Studies: CARDIAC CATHETERIZATION  Result Date: 06/29/2023   Prox RCA to Mid RCA lesion is 99% stenosed.   Mid RCA to Dist RCA lesion is 80% stenosed.   RPAV lesion is 100% stenosed.   Dist RCA lesion is 60% stenosed.   Mid LAD lesion is 20% stenosed. 1.  Mild mid LAD disease. 2.  High-grade mid right coronary artery lesion that is diffusely diseased with an occluded right posterolateral ventricular branch.  Given the lack of reproducible angina in the unclear benefit of revascularization in this setting, medical therapy will be pursued.  If the patient develops anginal symptoms that are refractory to medical therapy then PCI could be considered. 3.  Fick cardiac output of 4.4 L/min and Fick cardiac index of 2.7 L/min/m with the following hemodynamics:  Right atrial pressure mean of 15 mmHg  Right ventricular pressure 47/10 with an end-diastolic pressure of 19 mmHg  Wedge pressure mean of 24 mmHg with V waves to 31 mmHg  PA pressure 48/27 with Folashade Gamboa mean of 36 mmHg  Pulmonary vascular resistance of 2.7 Woods units  PA pulsatility index of 1.4 4.  LVEDP of 28 mmHg 5.  Occluded right radial artery  necessitating right ulnar approach. Recommendation: Medical therapy for nonischemic cardiomyopathy with coincident coronary artery disease.  ECHOCARDIOGRAM COMPLETE  Result Date: 06/27/2023    ECHOCARDIOGRAM REPORT   Patient Name:   MAIZIE UPTHEGROVE Date of Exam: 06/27/2023 Medical Rec #:  [id number]    Height:       63.0 in Accession #:    [id number]   Weight:       140.0 lb Date of Birth:  14-Feb-1971    BSA:          1.662 m Patient Age:    52 years     BP:           115/76 mmHg Patient Gender: F            HR:           116 bpm. Exam Location:  Inpatient Procedure: 2D Echo, Color Doppler, Cardiac Doppler and Intracardiac            Opacification Agent STAT ECHO REPORT CONTAINS CRITICAL RESULT Indications:    Dyspnea  History:        Patient has no prior history of Echocardiogram examinations.                 NSTEMI, Sepsis; Risk Factors:Current Smoker.  Sonographer:    Milbert Coulter Referring Phys: [id number] BELKYS Analissa Bayless REGALADO IMPRESSIONS  1. Left ventricular ejection fraction, by estimation, is 25 to 30%. The left ventricle has severely decreased function. The left ventricle demonstrates global hypokinesis. The left ventricular internal cavity size was moderately dilated. Left ventricular diastolic parameters are consistent with Grade II diastolic dysfunction (pseudonormalization).  2. Right ventricular systolic function is normal. The right ventricular size is normal.  3. Left atrial size was moderately dilated.  4. The mitral  valve is normal in structure. Moderate to severe mitral valve regurgitation. No evidence of mitral stenosis.  5. Tricuspid valve regurgitation is moderate.  6. The aortic valve is tricuspid. There is mild calcification of the aortic valve. There is mild thickening of the aortic valve. Aortic valve regurgitation is moderate. Aortic valve sclerosis is present, with no evidence of aortic valve stenosis.  7. The inferior vena cava is dilated in size with >50% respiratory variability, suggesting right  atrial pressure of 8 mmHg. FINDINGS  Left Ventricle: Left ventricular ejection fraction, by estimation, is 25 to 30%. The left ventricle has severely decreased function. The left ventricle demonstrates global hypokinesis. Definity contrast agent was given IV to delineate the left ventricular endocardial borders. The left ventricular internal cavity size was moderately dilated. There is no left ventricular hypertrophy. Left ventricular diastolic parameters are consistent with Grade II diastolic dysfunction (pseudonormalization). Right Ventricle: The right ventricular size is normal. No increase in right ventricular wall thickness. Right ventricular systolic function is normal. Left Atrium: Left atrial size was moderately dilated. Right Atrium: Right atrial size was normal in size. Pericardium: There is no evidence of pericardial effusion. Mitral Valve: The mitral valve is normal in structure. Moderate to severe mitral valve regurgitation. No evidence of mitral valve stenosis. Tricuspid Valve: The tricuspid valve is normal in structure. Tricuspid valve regurgitation is moderate . No evidence of tricuspid stenosis. Aortic Valve: The aortic valve is tricuspid. There is mild calcification of the aortic valve. There is mild thickening of the aortic valve. Aortic valve regurgitation is moderate. Aortic regurgitation PHT measures 225 msec. Aortic valve sclerosis is present, with no evidence of aortic valve stenosis. Aortic valve mean gradient measures 4.0 mmHg. Aortic valve peak gradient measures 6.2 mmHg. Aortic valve area, by VTI measures 1.95 cm. Pulmonic Valve: The pulmonic valve was normal in structure. Pulmonic valve regurgitation is not visualized. No evidence of pulmonic stenosis. Aorta: The aortic root is normal in size and structure. Venous: The inferior vena cava is dilated in size with greater than 50% respiratory variability, suggesting right atrial pressure of 8 mmHg. IAS/Shunts: No atrial level shunt  detected by color flow Doppler.  LEFT VENTRICLE PLAX 2D LVIDd:         5.00 cm   Diastology LVIDs:         4.70 cm   LV e' medial:    7.40 cm/s LV PW:         1.10 cm   LV E/e' medial:  14.2 LV IVS:        1.00 cm   LV e' lateral:   18.40 cm/s LVOT diam:     1.90 cm   LV E/e' lateral: 5.7 LV SV:         33 LV SV Index:   20 LVOT Area:     2.84 cm  RIGHT VENTRICLE RV Basal diam:  3.80 cm RV Mid diam:    2.80 cm RV S prime:     10.20 cm/s TAPSE (M-mode): 1.2 cm LEFT ATRIUM             Index        RIGHT ATRIUM           Index LA diam:        4.70 cm 2.83 cm/m   RA Area:     20.50 cm LA Vol (A2C):   46.5 ml 27.98 ml/m  RA Volume:   58.60 ml  35.27 ml/m LA Vol (A4C):  43.2 ml 26.00 ml/m LA Biplane Vol: 45.1 ml 27.14 ml/m  AORTIC VALVE AV Area (Vmax):    2.17 cm AV Area (Vmean):   1.96 cm AV Area (VTI):     1.95 cm AV Vmax:           125.00 cm/s AV Vmean:          89.400 cm/s AV VTI:            0.169 m AV Peak Grad:      6.2 mmHg AV Mean Grad:      4.0 mmHg LVOT Vmax:         95.65 cm/s LVOT Vmean:        61.800 cm/s LVOT VTI:          0.116 m LVOT/AV VTI ratio: 0.69 AI PHT:            225 msec  AORTA Ao Root diam: 2.90 cm Ao Asc diam:  3.00 cm MITRAL VALVE                  TRICUSPID VALVE MV Area (PHT): 5.62 cm       TR Peak grad:   37.9 mmHg MV Decel Time: 135 msec       TR Vmax:        308.00 cm/s MR Peak grad:    88.7 mmHg MR Mean grad:    64.0 mmHg    SHUNTS MR Vmax:         471.00 cm/s  Systemic VTI:  0.12 m MR Vmean:        386.0 cm/s   Systemic Diam: 1.90 cm MR PISA:         1.57 cm MR PISA Eff ROA: 10 mm MR PISA Radius:  0.50 cm MV E velocity: 105.00 cm/s Charlton Haws MD Electronically signed by Charlton Haws MD Signature Date/Time: 06/27/2023/12:23:54 PM    Final    DG CHEST PORT 1 VIEW  Result Date: 06/27/2023 CLINICAL DATA:  Dyspnea. EXAM: PORTABLE CHEST 1 VIEW COMPARISON:  06/26/2023. FINDINGS: Cardiac silhouette appears prominent. No pneumonia or pulmonary edema. No pneumothorax or pleural  effusion. IMPRESSION: Enlarged cardiac silhouette. Electronically Signed   By: Layla Maw M.D.   On: 06/27/2023 09:28   CT Angio Chest PE W/Cm &/Or Wo Cm  Result Date: 06/26/2023 CLINICAL DATA:  Shortness of breath with cough EXAM: CT ANGIOGRAPHY CHEST WITH CONTRAST TECHNIQUE: Multidetector CT imaging of the chest was performed using the standard protocol during bolus administration of intravenous contrast. Multiplanar CT image reconstructions and MIPs were obtained to evaluate the vascular anatomy. RADIATION DOSE REDUCTION: This exam was performed according to the departmental dose-optimization program which includes automated exposure control, adjustment of the mA and/or kV according to patient size and/or use of iterative reconstruction technique. CONTRAST:  75mL OMNIPAQUE IOHEXOL 350 MG/ML SOLN COMPARISON:  Chest x-ray 06/26/2023 FINDINGS: Cardiovascular: Satisfactory opacification of the pulmonary arteries to the segmental level. No evidence of pulmonary embolism. Nonaneurysmal aorta. Cardiomegaly. No pericardial effusion Mediastinum/Nodes: Patent trachea. No thyroid mass. Multiple mildly enlarged mediastinal lymph nodes. Right upper paratracheal node measures 10 mm. Prevascular lymph nodes measure up to 13 mm. Low right paratracheal nodes up to 10 mm. Esophagus within normal limits Lungs/Pleura: Moderate right pleural effusion. Diffuse hazy pulmonary density and mild diffuse septal thickening. Minimal ground-glass density in the right lower lobe. Upper Abdomen: No acute finding Musculoskeletal: No chest wall abnormality. No acute or significant osseous findings. Review of the MIP images  confirms the above findings. IMPRESSION: 1. Negative for acute pulmonary embolus. 2. Cardiomegaly with moderate right pleural effusion and diffuse hazy pulmonary density and mild septal thickening, findings are suggestive of pulmonary edema. Minimal ground-glass density in the right lower lobe, possible edema or  infection. 3. Mildly enlarged mediastinal lymph nodes, likely reactive. Electronically Signed   By: Jasmine Pang M.D.   On: 06/26/2023 23:50   DG Chest 2 View  Result Date: 06/26/2023 CLINICAL DATA:  Shortness of breath EXAM: CHEST - 2 VIEW COMPARISON:  06/04/2018 FINDINGS: Cardiomegaly with slight central congestion. No mild airspace disease at the right base. Possible tiny right effusion. No pneumothorax IMPRESSION: Cardiomegaly with slight central congestion and possible tiny right effusion. Mild airspace disease at right base, atelectasis versus pneumonia. Electronically Signed   By: Jasmine Pang M.D.   On: 06/26/2023 19:30    Microbiology: Recent Results (from the past 240 hour(s))  Resp panel by RT-PCR (RSV, Flu Othar Curto&B, Covid) Anterior Nasal Swab     Status: None   Collection Time: 06/26/23  8:21 PM   Specimen: Anterior Nasal Swab  Result Value Ref Range Status   SARS Coronavirus 2 by RT PCR NEGATIVE NEGATIVE Final    Comment: (NOTE) SARS-CoV-2 target nucleic acids are NOT DETECTED.  The SARS-CoV-2 RNA is generally detectable in upper respiratory specimens during the acute phase of infection. The lowest concentration of SARS-CoV-2 viral copies this assay can detect is 138 copies/mL. Aide Wojnar negative result does not preclude SARS-Cov-2 infection and should not be used as the sole basis for treatment or other patient management decisions. Teddy Pena negative result may occur with  improper specimen collection/handling, submission of specimen other than nasopharyngeal swab, presence of viral mutation(s) within the areas targeted by this assay, and inadequate number of viral copies(<138 copies/mL). Graclynn Vanantwerp negative result must be combined with clinical observations, patient history, and epidemiological information. The expected result is Negative.  Fact Sheet for Patients:  BloggerCourse.com  Fact Sheet for Healthcare Providers:  SeriousBroker.it  This  test is no t yet approved or cleared by the [place] FDA and  has been authorized for detection and/or diagnosis of SARS-CoV-2 by FDA under an Emergency Use Authorization (EUA). This EUA will remain  in effect (meaning this test can be used) for the duration of the COVID-19 declaration under Section 564(b)(1) of the Act, 21 U.S.C.section 360bbb-3(b)(1), unless the authorization is terminated  or revoked sooner.       Influenza Eulamae Greenstein by PCR NEGATIVE NEGATIVE Final   Influenza B by PCR NEGATIVE NEGATIVE Final    Comment: (NOTE) The Xpert Xpress SARS-CoV-2/FLU/RSV plus assay is intended as an aid in the diagnosis of influenza from Nasopharyngeal swab specimens and should not be used as Vivienne Sangiovanni sole basis for treatment. Nasal washings and aspirates are unacceptable for Xpert Xpress SARS-CoV-2/FLU/RSV testing.  Fact Sheet for Patients: BloggerCourse.com  Fact Sheet for Healthcare Providers: SeriousBroker.it  This test is not yet approved or cleared by the [place] FDA and has been authorized for detection and/or diagnosis of SARS-CoV-2 by FDA under an Emergency Use Authorization (EUA). This EUA will remain in effect (meaning this test can be used) for the duration of the COVID-19 declaration under Section 564(b)(1) of the Act, 21 U.S.C. section 360bbb-3(b)(1), unless the authorization is terminated or revoked.     Resp Syncytial Virus by PCR NEGATIVE NEGATIVE Final    Comment: (NOTE) Fact Sheet for Patients: BloggerCourse.com  Fact Sheet for Healthcare Providers: SeriousBroker.it  This test is not yet  approved or cleared by the [place] and has been authorized for detection and/or diagnosis of SARS-CoV-2 by FDA under an Emergency Use Authorization (EUA). This EUA will remain in effect (meaning this test can be used) for the duration of the COVID-19 declaration under  Section 564(b)(1) of the Act, 21 U.S.C. section 360bbb-3(b)(1), unless the authorization is terminated or revoked.  Performed at [facility], [address], [place], [place] [address]   Culture, blood (x 2)     Status: None (Preliminary result)   Collection Time: 06/27/23  6:13 AM   Specimen: Right Antecubital; Blood  Result Value Ref Range Status   Specimen Description   Final    RIGHT ANTECUBITAL BOTTLES DRAWN AEROBIC AND ANAEROBIC   Special Requests Blood Culture adequate volume  Final   Culture   Final    NO GROWTH 4 DAYS Performed at [facility], [address], [place], [place] [address]    Report Status PENDING  Incomplete  Culture, blood (x 2)     Status: None (Preliminary result)   Collection Time: 06/27/23  6:13 AM   Specimen: Left Antecubital; Blood  Result Value Ref Range Status   Specimen Description   Final    LEFT ANTECUBITAL BOTTLES DRAWN AEROBIC AND ANAEROBIC   Special Requests Blood Culture adequate volume  Final   Culture   Final    NO GROWTH 4 DAYS Performed at [facility], [address], [place], [place] [address]    Report Status PENDING  Incomplete  Culture, OB Urine     Status: None   Collection Time: 06/27/23 10:54 AM   Specimen: Urine, Random  Result Value Ref Range Status   Specimen Description URINE, RANDOM  Final   Special Requests NONE  Final   Culture   Final    NO GROWTH NO GROUP B STREP (S.AGALACTIAE) ISOLATED Performed at [facility], [address] [place], [place] [address]    Report Status 06/29/2023 FINAL  Final     Labs: Basic Metabolic Panel: Recent Labs  Lab 06/27/23 0831 06/28/23 0847 06/29/23 0452 06/29/23 1240 06/29/23 1242 06/29/23 1248 06/30/23 0418 07/01/23 0357  NA 136 135 134* 138 137 140 133* 131*  K 3.6 3.2* 3.4* 3.8 3.4* 3.7 4.4 4.5  CL 103 100 99  --   --   --  99 100  CO2 14* 23 25  --   --   --  25 22  GLUCOSE 130* 118* 95  --   --   --  100* 95  BUN 16 15 10   --   --   --  12 16  CREATININE  1.00 1.08* 0.82  --   --   --  1.13* 0.94  CALCIUM 8.2* 7.8* 7.8*  --   --   --  8.5* 9.0  MG 2.2 1.7  --   --   --   --  2.0 2.1   Liver Function Tests: Recent Labs  Lab 06/28/23 0847 06/29/23 0452 06/30/23 0418 07/01/23 0357  AST 2,251* 657* 231* 108*  ALT 2,020* 1,312* 1,021* 734*  ALKPHOS 103 105 103 110  BILITOT 1.3* 1.2* 1.6* 1.3*  PROT 5.4* 5.2* 6.1* 6.5  ALBUMIN 2.6* 2.5* 2.7* 3.0*   No results for input(s): "LIPASE", "AMYLASE" in the last 168 hours. No results for input(s): "AMMONIA" in the last 168 hours. CBC: Recent Labs  Lab 06/26/23 2134 06/27/23 0831 06/28/23 0410 06/29/23 0404 06/29/23 1240 06/29/23 1242 06/29/23 1248  06/30/23 0418 07/01/23 0357  WBC 11.4* 13.9* 13.1* 8.9  --   --   --  7.9 7.6  NEUTROABS 7.8*  --   --   --   --   --   --   --   --   HGB 12.7 12.6 11.4* 12.3 13.6 13.3 13.6 13.4 14.8  HCT 39.1 42.9 36.1 39.0 40.0 39.0 40.0 42.8 47.0*  MCV 90.7 99.5 91.2 90.9  --   --   --  90.3 89.7  PLT 155 135* 118* 142*  --   --   --  168 194   Cardiac Enzymes: No results for input(s): "CKTOTAL", "CKMB", "CKMBINDEX", "TROPONINI" in the last 168 hours. BNP: BNP (last 3 results) Recent Labs    06/27/23 0831  BNP 1,741.0*    ProBNP (last 3 results) No results for input(s): "PROBNP" in the last 8760 hours.  CBG: No results for input(s): "GLUCAP" in the last 168 hours.     Signed:  Lacretia Nicks MD.  [facility] 07/01/2023, 1:59 PM

## 2023-07-02 LAB — CULTURE, BLOOD (ROUTINE X 2)
Special Requests: ADEQUATE
Special Requests: ADEQUATE

## 2023-07-03 NOTE — Progress Notes (Signed)
HEART & VASCULAR TRANSITION OF CARE CONSULT NOTE   Referring Physician: Dr. Anne Fu Primary Care: Patient, No Pcp Per Primary Cardiologist: Dr. Eden Emms  CC: post hospital [facility] HF follow up  HPI: Referred to clinic by Dr. Anne Fu for heart failure consultation.   52 y.o. white female with past medical history of tobacco abuse and new diagnosis of systolic heart failure and CAD.   Admitted 12/24 with PNA. HsTroponin 1168 >> 1033. ECG showed ST with inferior Q waves. Cardiology was consulted, echo showed EF 25-30%, G2DD, normal RV, moderate to severe MR and moderate AI. Underwent R/LHC showing high-grade mid right RCA lesion with occluded right posterolateral ventricular branch, with mild mid LAD disease. Given lack of reproducible angina and unclear benefit of revascularization, medical therapy was elected. RHC showed elevated filling pressures, preserved CI of 2.7 and PAPi 1.4, PVR 2.7 WU. GDMT titrated and she was discharged home, weight 130 lbs.  Today she presents to [facility] for post hospital follow up, with her daughter. Overall feeling fine. She is not SOB walking up steps or with activity. Has been walking around track with her children for exercise. Denies palpitations, CP, dizziness, edema, or PND/Orthopnea. Appetite ok. No fever or chills. Weight at home 132-134 pounds. Taking all medications, off Jardiance due to rash/itchiness.Symptoms resolved when stopping med. She snores.  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  Family Hx: Grandfather with MI. Social Hx: smoker (stopped 05/2023).  Cardiac Testing  - R/LHC (12/24): high-grade mid right RCA lesion with occluded right posterolateral ventricular branch, with mild mid LAD disease RA 15, PA 48/27 (36), PCWP 24 (v waves to 31), CO/CI (Fick) 4.4/2.7, PAPi 1.4, PVR 2.7 WU.   - Echo (12/24): EF 25-30%, G2DD, normal RV, moderate to severe MR and moderate AI.  History reviewed. No pertinent past medical  history.  Current Outpatient Medications  Medication Sig Dispense Refill   aspirin EC 81 MG tablet Take 1 tablet (81 mg total) by mouth daily. Swallow whole. 30 tablet 11   metoprolol succinate (TOPROL-XL) 25 MG 24 hr tablet Take 1 tablet (25 mg total) by mouth daily. 90 tablet 0   sacubitril-valsartan (ENTRESTO) 24-26 MG Take 1 tablet by mouth 2 (two) times daily. Follow up with cardiology for refills 60 tablet 0   empagliflozin (JARDIANCE) 10 MG TABS tablet Take 1 tablet (10 mg total) by mouth daily. (Patient not taking: Reported on 07/13/2023) 90 tablet 0   No current facility-administered medications for this encounter.   Allergies  Allergen Reactions   Sulfa Antibiotics Hives   Social History   Socioeconomic History   Marital status: Married    Spouse name: Joe   Number of children: Not on file   Years of education: Not on file   Highest education level: Associate degree: academic program  Occupational History   Occupation: Educational psychologist  Tobacco Use   Smoking status: Former    Current packs/day: 0.50    Types: Cigarettes   Smokeless tobacco: Never  Vaping Use   Vaping status: Never Used  Substance and Sexual Activity   Alcohol use: Not Currently   Drug use: Not Currently   Sexual activity: Not on file  Other Topics Concern   Not on file  Social History Narrative   Not on file   Social Drivers of Health   Financial Resource Strain: Low Risk  (06/29/2023)   Overall Financial Resource Strain (CARDIA)    Difficulty of Paying  Living Expenses: Not hard at all  Food Insecurity: No Food Insecurity (06/27/2023)   Hunger Vital Sign    Worried About Running Out of Food in the Last Year: Never true    Ran Out of Food in the Last Year: Never true  Transportation Needs: No Transportation Needs (06/27/2023)   PRAPARE - Administrator, Civil Service (Medical): No    Lack of Transportation (Non-Medical): No  Physical Activity: Not on file  Stress: No Stress  Concern Present (05/16/2019)   Received from [facility], [facility] - Occupational Stress Questionnaire    Feeling of Stress : Not at all  Social Connections: Not on file  Intimate Partner Violence: Not At Risk (06/27/2023)   Humiliation, Afraid, Rape, and Kick questionnaire    Fear of Current or Ex-Partner: No    Emotionally Abused: No    Physically Abused: No    Sexually Abused: No    Family History  Problem Relation Age of Onset   Healthy Mother    Healthy Father    BP 102/60   Pulse 95   Wt 59.9 kg (132 lb)   SpO2 97%   BMI 23.38 kg/m   Wt Readings from Last 3 Encounters:  07/13/23 59.9 kg (132 lb)  07/01/23 59.3 kg (130 lb 12.8 oz)  06/04/18 62.1 kg (137 lb)   PHYSICAL EXAM: General:  NAD. No resp difficulty, walked into clinic HEENT: Normal Neck: Supple. No JVD. Carotids 2+ bilat; no bruits. No lymphadenopathy or thryomegaly appreciated. Cor: PMI nondisplaced. Regular rate & rhythm. No rubs, gallops or murmurs. Lungs: Clear Abdomen: Soft, nontender, nondistended. No hepatosplenomegaly. No bruits or masses. Good bowel sounds. Extremities: No cyanosis, clubbing, rash, edema Neuro: Alert & oriented x 3, cranial nerves grossly intact. Moves all 4 extremities w/o difficulty. Affect pleasant  ECG (personally reviewed): NSR with PVC, 91 bpm  ASSESSMENT & PLAN: Chronic Systolic Heart Failure - New diagnosis. - Echo (12/24): EF 25-30%, G2DD, normal RV, moderate to severe MR and moderate AI.  - R/LHC (12/24): high-grade mid right RCA lesion with occluded right posterolateral ventricular branch, with mild mid LAD disease; RHC showed mild pulmonary hypertension, normal CO.  - Etiology unclear, lactate 8 on admission. ? Myopathy of critical illness vs myocarditis. Had URI symptoms proceeding hospitalization. CRP 5.8, ESR normal.  - Arrange cMRI to evaluate for evidence of inflammation or infiltrative disease. - NYHA I,  volume OK today. Does not need a loop - Off Jardiance with rash/vaginal itchiness. Consider trial of Farxiga, but will wait a couple weeks. - Start spironolactone 12.5 mg q hs. - Continue Entresto 24/26 mg bid. - Continue Toprol XL 25 mg daily. - Labs today, repeat BMET in 1 week. - She has been referred to Cardiac Rehab - Plan to repeat echo in 3 months.  CAD - LHC with RCA and LAD lesion; -->treating medically.  - Her OCPs were stopped during admission. - No chest pain - Continue ASA - No statin with elevated LFTs. Check LFTs today, plan to start atorva when stabilized. - CR as above  3. AI/MR - Echo (12/23): AoV mean gradient 4.0 mmHg, AoV peak gradient 6.2 mmHg, AVA by VTI  1.95 cm.  - Follow  4. 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 Elevated LFTs - Likely related to hepatic congestion - CMET today  6. PVCs - Asymptomatic.  She snores - Sleep study as above - Labs today  7. Tobacco Use - Smoker since age 31 - Quit x 1 month. Congratulated.  NYHA I GDMT  Diuretic: does not need BB: Toprol XL 25 mg daily Ace/ARB/ARNI: Entresto 24/26 mg bid MRA: start spiro 12.5 mg qhs SGLT2i: Hold off with rash/vaginal itchiness  Referred to HFSW (PCP, Medications, Transportation, ETOH Abuse, Drug Abuse, Insurance, Financial ): No Refer to Pharmacy: No Refer to Home Health: No Refer to Advanced Heart Failure Clinic: Yes, assign to Dr. Elwyn Lade Refer to General Cardiology: No, shared with Dr. Eden Emms.  Refer to AHF clinic. Follow up in 3 weeks with APP and 3 months with Dr. Elwyn Lade + echo  Katherine, FNP-BC 07/13/23

## 2023-07-05 ENCOUNTER — Telehealth: Payer: Self-pay | Admitting: Physician Assistant

## 2023-07-05 NOTE — Telephone Encounter (Signed)
Pt states she developed itchy rash over torso yesterday evening. She is also having vaginal itching, no discharge. She was taken off birth control pills during her hospitalization. She does not describe urinary symptoms. She denies face/tongue/lips tingling, burning, or swelling. No new soaps/lotions/detergents. Lotion has eased the rash.   Given vaginal symptoms, I will stop jardiance for now. I recommended 25-50 mg benadryl with hydrocortisone ointment. She has follow up with heart failure TOC clinic. If rash does not improve, may need to stop entresto.

## 2023-07-13 ENCOUNTER — Encounter (HOSPITAL_COMMUNITY): Payer: Self-pay

## 2023-07-13 ENCOUNTER — Ambulatory Visit (HOSPITAL_COMMUNITY)
Admit: 2023-07-13 | Discharge: 2023-07-13 | Disposition: A | Discharge status: Home / Self Care | Payer: 59 | Attending: Family Medicine

## 2023-07-13 VITALS — BP 102/60 | HR 95 | Wt 132.0 lb

## 2023-07-13 DIAGNOSIS — I272 Pulmonary hypertension, unspecified: Secondary | ICD-10-CM | POA: Diagnosis not present

## 2023-07-13 DIAGNOSIS — Z720 Tobacco use: Secondary | ICD-10-CM

## 2023-07-13 DIAGNOSIS — I493 Ventricular premature depolarization: Secondary | ICD-10-CM | POA: Insufficient documentation

## 2023-07-13 DIAGNOSIS — Z79899 Other long term (current) drug therapy: Secondary | ICD-10-CM | POA: Insufficient documentation

## 2023-07-13 DIAGNOSIS — I5022 Chronic systolic (congestive) heart failure: Secondary | ICD-10-CM | POA: Diagnosis present

## 2023-07-13 DIAGNOSIS — I2510 Atherosclerotic heart disease of native coronary artery without angina pectoris: Secondary | ICD-10-CM | POA: Insufficient documentation

## 2023-07-13 DIAGNOSIS — I351 Nonrheumatic aortic (valve) insufficiency: Secondary | ICD-10-CM

## 2023-07-13 DIAGNOSIS — R21 Rash and other nonspecific skin eruption: Secondary | ICD-10-CM | POA: Insufficient documentation

## 2023-07-13 DIAGNOSIS — R0683 Snoring: Secondary | ICD-10-CM | POA: Diagnosis not present

## 2023-07-13 DIAGNOSIS — R7989 Other specified abnormal findings of blood chemistry: Secondary | ICD-10-CM

## 2023-07-13 DIAGNOSIS — I5020 Unspecified systolic (congestive) heart failure: Secondary | ICD-10-CM

## 2023-07-13 DIAGNOSIS — I340 Nonrheumatic mitral (valve) insufficiency: Secondary | ICD-10-CM | POA: Diagnosis not present

## 2023-07-13 DIAGNOSIS — Z87891 Personal history of nicotine dependence: Secondary | ICD-10-CM | POA: Insufficient documentation

## 2023-07-13 LAB — COMPREHENSIVE METABOLIC PANEL
ALT: 42 U/L (ref 0–44)
AST: 35 U/L (ref 15–41)
Albumin: 3.4 g/dL — ABNORMAL LOW (ref 3.5–5.0)
Alkaline Phosphatase: 71 U/L (ref 38–126)
Anion gap: 6 (ref 5–15)
BUN: 11 mg/dL (ref 6–20)
CO2: 25 mmol/L (ref 22–32)
Calcium: 10.1 mg/dL (ref 8.9–10.3)
Chloride: 106 mmol/L (ref 98–111)
Creatinine, Ser: 0.91 mg/dL (ref 0.44–1.00)
GFR, Estimated: >60 mL/min (ref >60)
Glucose, Bld: 75 mg/dL (ref 70–99)
Potassium: 4.8 mmol/L (ref 3.5–5.1)
Sodium: 137 mmol/L (ref 135–145)
Total Bilirubin: 0.7 mg/dL (ref <1.2)
Total Protein: 7 g/dL (ref 6.5–8.1)

## 2023-07-13 LAB — BRAIN NATRIURETIC PEPTIDE: B Natriuretic Peptide: 245.8 pg/mL — ABNORMAL HIGH (ref 0.0–100.0)

## 2023-07-13 LAB — TSH: TSH: 0.956 u[IU]/mL (ref 0.350–4.500)

## 2023-07-13 LAB — HEMOGLOBIN A1C
Hgb A1c MFr Bld: 5.2 % (ref 4.8–5.6)
Mean Plasma Glucose: 102.54 mg/dL

## 2023-07-13 MED ORDER — EMPAGLIFLOZIN 10 MG PO TABS: 10.0000 mg | ORAL_TABLET | Freq: Every day | ORAL | 3 refills | Status: DC

## 2023-07-13 MED ORDER — SACUBITRIL-VALSARTAN 24-26 MG PO TABS: 1.0000 | ORAL_TABLET | Freq: Two times a day (BID) | ORAL | 3 refills | Status: DC

## 2023-07-13 MED ORDER — SPIRONOLACTONE 25 MG PO TABS: 12.5000 mg | ORAL_TABLET | Freq: Every day | ORAL | 3 refills | Status: DC

## 2023-07-13 MED ORDER — METOPROLOL SUCCINATE ER 25 MG PO TB24: 25.0000 mg | ORAL_TABLET | Freq: Every day | ORAL | 3 refills | Status: DC

## 2023-07-13 NOTE — Progress Notes (Signed)
Height:     Weight: BMI:  Today's Date:  STOP BANG RISK ASSESSMENT S (snore) Have you been told that you snore?     /NO   T (tired) Are you often tired, fatigued, or sleepy during the day?   YES/  O (obstruction) Do you stop breathing, choke, or gasp during sleep? /NO   P (pressure) Do you have or are you being treated for high blood pressure? YES   B (BMI) Is your body index greater than 35 kg/m? /NO   A (age) Are you 50 years old or older? YES/  N (neck) Do you have a neck circumference greater than 16 inches?   YES/NO   G (gender) Are you a female? /NO   TOTAL STOP/BANG "YES" ANSWERS 3                                                                       For Office Use Only              Procedure Order Form    YES to 3+ Stop Bang questions OR two clinical symptoms - patient qualifies for WatchPAT (CPT 95800)      Clinical Notes: Will consult Sleep Specialist and refer for management of therapy due to patient increased risk of Sleep Apnea. Ordering a sleep study due to the following two clinical symptoms: Excessive daytime sleepiness G47.10 / changes or irritability R45.4 / Loud snoring R06.83 / Depression F32.9 / Unrefreshed by sleep G47.8 / Impotence N52.9 / History of high blood pressure R03.0 / Insomnia G47.00

## 2023-07-13 NOTE — Progress Notes (Signed)
ITAMAR home sleep study given to patient, all instructions explained, waiver signed, and CLOUDPAT registration complete.  

## 2023-07-13 NOTE — Patient Instructions (Signed)
Medication Changes:  START: SPIRONOLACTONE 12.5MG  ONCE DAILY AT NIGHTTIME   Lab Work:  Labs done today, your results will be available in MyChart, we will contact you for abnormal readings.  PLEASE GO TO [facility] IN [place] FOR REPEAT LABS IN 7-10 DAYS   PLEASE REFER TO [facility] FOR BLOOD WORK TESTING: LOCATIONS LISTED BELOW   Address: [address] [place], [place] [address] Hours:  Open ? Closes 12:30?PM ? Reopens 1:30?PM Updated by this business 7 weeks ago Phone: [phone] Appointments: [email or web address]  Address: [address] [place], [place] [address] Hours:  Open ? Closes 12?PM ? Reopens 1?PM Updated by this business 7 weeks ago Phone: [phone] Appointments: [email or web address]  Testing/Procedures:  Your provider has recommended that you have a home sleep study (Itamar Test).  We have provided you with the equipment in our office today. Please go ahead and download the app. DO NOT OPEN OR TAMPER WITH THE BOX UNTIL WE ADVISE YOU TO DO SO. Once insurance has approved the test our office will call you with PIN number and approval to proceed with testing. Once you have completed the test you just dispose of the equipment, the information is automatically uploaded to [place] via blue-tooth technology. If your test is positive for sleep apnea and you need a home CPAP machine you will be contacted by Dr Norris Cross office [facility]) to set this up.   Special Instructions // Education:  PLEASE SCHEDULE FOLLOW UP WITH OBGYN   PLEASE SCHEDULE FOLLOW UP WITH PRIMARY CARE PHYSICIAN   Follow-Up in: AS SCHEDULED IN 3-4 WEEKS WITH APP   THEN IN 3 MONS WITH DR. Elwyn Lade AND ECHO AS SCHEDULED   At the [facility], you and your health needs are our priority. We have a designated team specialized in the treatment of Heart Failure. This Care Team includes your primary Heart Failure Specialized Cardiologist (physician), Advanced Practice Providers (APPs- Physician  Assistants and Nurse Practitioners), and Pharmacist who all work together to provide you with the care you need, when you need it.   You may see any of the following providers on your designated Care Team at your next follow up:  Dr. Arvilla Meres Dr. Marca Ancona Dr. Dorthula Nettles Dr. Theresia Bough Tonye Becket, NP Robbie Lis, [place] [facility], [place] Brynda Peon, NP [place] Lee, NP Karle Plumber, PharmD   Please be sure to bring in all your medications bottles to every appointment.   Need to Contact us:  If you have any questions or concerns before your next appointment please send [place] a message through [place] or call our office at [phone].    TO LEAVE A MESSAGE FOR THE NURSE SELECT OPTION 2, PLEASE LEAVE A MESSAGE INCLUDING: YOUR NAME DATE OF BIRTH CALL BACK NUMBER REASON FOR CALL**this is important as we prioritize the call backs  YOU WILL RECEIVE A CALL BACK THE SAME DAY AS LONG AS YOU CALL BEFORE 4:00 PM

## 2023-07-13 NOTE — Addendum Note (Signed)
Encounter addended by: Suezanne Cheshire, RN on: 07/13/2023 11:35 AM  Actions taken: Delete clinical note, Clinical Note Signed

## 2023-07-13 NOTE — Progress Notes (Deleted)
osa

## 2023-07-13 NOTE — Addendum Note (Signed)
Encounter addended by: Suezanne Cheshire, RN on: 07/13/2023 11:31 AM  Actions taken: Clinical Note Signed

## 2023-07-27 ENCOUNTER — Encounter (HOSPITAL_COMMUNITY): Payer: 59

## 2023-07-30 ENCOUNTER — Encounter (HOSPITAL_COMMUNITY)
Admission: RE | Admit: 2023-07-30 | Discharge: 2023-07-30 | Disposition: A | Discharge status: Home / Self Care | Payer: 59 | Source: Ambulatory Visit | Attending: Cardiovascular Disease | Admitting: Cardiovascular Disease

## 2023-07-30 VITALS — Ht 63.0 in | Wt 133.4 lb

## 2023-07-30 DIAGNOSIS — R7989 Other specified abnormal findings of blood chemistry: Secondary | ICD-10-CM | POA: Insufficient documentation

## 2023-07-30 DIAGNOSIS — I214 Non-ST elevation (NSTEMI) myocardial infarction: Secondary | ICD-10-CM | POA: Insufficient documentation

## 2023-07-30 DIAGNOSIS — I272 Pulmonary hypertension, unspecified: Secondary | ICD-10-CM | POA: Insufficient documentation

## 2023-07-30 DIAGNOSIS — I493 Ventricular premature depolarization: Secondary | ICD-10-CM | POA: Insufficient documentation

## 2023-07-30 DIAGNOSIS — I340 Nonrheumatic mitral (valve) insufficiency: Secondary | ICD-10-CM | POA: Insufficient documentation

## 2023-07-30 DIAGNOSIS — Z720 Tobacco use: Secondary | ICD-10-CM | POA: Insufficient documentation

## 2023-07-30 DIAGNOSIS — I351 Nonrheumatic aortic (valve) insufficiency: Secondary | ICD-10-CM | POA: Insufficient documentation

## 2023-07-30 DIAGNOSIS — I5020 Unspecified systolic (congestive) heart failure: Secondary | ICD-10-CM | POA: Insufficient documentation

## 2023-07-30 DIAGNOSIS — I2510 Atherosclerotic heart disease of native coronary artery without angina pectoris: Secondary | ICD-10-CM | POA: Insufficient documentation

## 2023-07-30 NOTE — Patient Instructions (Signed)
 Patient Instructions  Patient Details  Name: Jennifer King MRN: [id number] Date of Birth: 04/18/1971 Referring Provider:  Trixie Nilda HERO, MD  Below are your personal goals for exercise, nutrition, and risk factors. Our goal is to help you stay on track towards obtaining and maintaining these goals. We will be discussing your progress on these goals with you throughout the program.  Initial Exercise Prescription:  Initial Exercise Prescription - 07/30/23 1500       Date of Initial Exercise RX and Referring Provider   Date 07/30/23    Referring Provider Trixie Nilda MD      Oxygen   Maintain Oxygen Saturation 88% or higher      Treadmill   MPH 1.5    Grade 0    Minutes 15    METs 2.3      REL-XR   Level 2    Speed 50    Minutes 15    METs 2      Prescription Details   Frequency (times per week) 3    Duration Progress to 30 minutes of continuous aerobic without signs/symptoms of physical distress      Intensity   THRR 40-80% of Max Heartrate 125-155    Ratings of Perceived Exertion 11-13    Perceived Dyspnea 0-4      Resistance Training   Training Prescription Yes    Weight 4    Reps 10-15             Exercise Goals: Frequency: Be able to perform aerobic exercise two to three times per week in program working toward 2-5 days per week of home exercise.  Intensity: Work with a perceived exertion of 11 (fairly light) - 15 (hard) while following your exercise prescription.  We will make changes to your prescription with you as you progress through the program.   Duration: Be able to do 30 to 45 minutes of continuous aerobic exercise in addition to a 5 minute warm-up and a 5 minute cool-down routine.   Nutrition Goals: Your personal nutrition goals will be established when you do your nutrition analysis with the dietician.  The following are general nutrition guidelines to follow: Cholesterol < 200mg /day Sodium < 1500mg /day Fiber: Women over 50 yrs - 21  grams per day  Personal Goals:  Personal Goals and Risk Factors at Admission - 07/30/23 1503       Core Components/Risk Factors/Patient Goals on Admission    Weight Management Weight Maintenance    Heart Failure Yes    Intervention Provide a combined exercise and nutrition program that is supplemented with education, support and counseling about heart failure. Directed toward relieving symptoms such as shortness of breath, decreased exercise tolerance, and extremity edema.    Expected Outcomes Improve functional capacity of life;Short term: Attendance in program 2-3 days a week with increased exercise capacity. Reported lower sodium intake. Reported increased fruit and vegetable intake. Reports medication compliance.;Short term: Daily weights obtained and reported for increase. Utilizing diuretic protocols set by physician.;Long term: Adoption of self-care skills and reduction of barriers for early signs and symptoms recognition and intervention leading to self-care maintenance.    Stress Yes    Intervention Offer individual and/or small group education and counseling on adjustment to heart disease, stress management and health-related lifestyle change. Teach and support self-help strategies.;Refer participants experiencing significant psychosocial distress to appropriate mental health specialists for further evaluation and treatment. When possible, include family members and significant others in education/counseling sessions.    Expected  Outcomes Short Term: Participant demonstrates changes in health-related behavior, relaxation and other stress management skills, ability to obtain effective social support, and compliance with psychotropic medications if prescribed.;Long Term: Emotional wellbeing is indicated by absence of clinically significant psychosocial distress or social isolation.             Tobacco Use Initial Evaluation: Social History   Tobacco Use  Smoking Status Former    Current packs/day: 0.50   Types: Cigarettes  Smokeless Tobacco Never    Exercise Goals and Review:  Exercise Goals     Row Name 07/30/23 1516             Exercise Goals   Increase Physical Activity Yes       Intervention Provide advice, education, support and counseling about physical activity/exercise needs.;Develop an individualized exercise prescription for aerobic and resistive training based on initial evaluation findings, risk stratification, comorbidities and participant's personal goals.       Expected Outcomes Short Term: Attend rehab on a regular basis to increase amount of physical activity.;Long Term: Add in home exercise to make exercise part of routine and to increase amount of physical activity.;Long Term: Exercising regularly at least 3-5 days a week.       Increase Strength and Stamina Yes       Intervention Provide advice, education, support and counseling about physical activity/exercise needs.;Develop an individualized exercise prescription for aerobic and resistive training based on initial evaluation findings, risk stratification, comorbidities and participant's personal goals.       Expected Outcomes Short Term: Increase workloads from initial exercise prescription for resistance, speed, and METs.;Long Term: Improve cardiorespiratory fitness, muscular endurance and strength as measured by increased METs and functional capacity ( );Short Term: Perform resistance training exercises routinely during rehab and add in resistance training at home       Able to understand and use rate of perceived exertion (RPE) scale Yes       Intervention Provide education and explanation on how to use RPE scale       Expected Outcomes Short Term: Able to use RPE daily in rehab to express subjective intensity level;Long Term:  Able to use RPE to guide intensity level when exercising independently       Able to understand and use Dyspnea scale Yes       Intervention Provide education and  explanation on how to use Dyspnea scale       Expected Outcomes Short Term: Able to use Dyspnea scale daily in rehab to express subjective sense of shortness of breath during exertion;Long Term: Able to use Dyspnea scale to guide intensity level when exercising independently       Knowledge and understanding of Target Heart Rate Range (THRR) Yes       Intervention Provide education and explanation of THRR including how the numbers were predicted and where they are located for reference       Expected Outcomes Short Term: Able to state/look up THRR;Short Term: Able to use daily as guideline for intensity in rehab;Long Term: Able to use THRR to govern intensity when exercising independently       Able to check pulse independently Yes       Intervention Provide education and demonstration on how to check pulse in carotid and radial arteries.;Review the importance of being able to check your own pulse for safety during independent exercise       Expected Outcomes Short Term: Able to explain why pulse checking is important during independent  exercise;Long Term: Able to check pulse independently and accurately       Understanding of Exercise Prescription Yes       Intervention Provide education, explanation, and written materials on patient's individual exercise prescription       Expected Outcomes Short Term: Able to explain program exercise prescription;Long Term: Able to explain home exercise prescription to exercise independently                Copy of goals given to participant.

## 2023-07-30 NOTE — Progress Notes (Signed)
 Cardiac Individual Treatment Plan  Patient Details  Name: Jennifer King MRN: [id number] Date of Birth: 04-18-1971 Referring Provider:   Flowsheet Row CARDIAC REHAB PHASE II ORIENTATION from 07/30/2023 in [facility]  Referring Provider Trixie Hilda MD       Initial Encounter Date:  Flowsheet Row CARDIAC REHAB PHASE II ORIENTATION from 07/30/2023 in [facility]  Date 07/30/23       Visit Diagnosis: NSTEMI (non-ST elevated myocardial infarction) [facility])  Patient's Home Medications on Admission:  Current Outpatient Medications:    aspirin  EC 81 MG tablet, Take 1 tablet (81 mg total) by mouth daily. Swallow whole., Disp: 30 tablet, Rfl: 11   diphenhydrAMINE (BENADRYL) 25 mg capsule, Take 25 mg by mouth daily., Disp: , Rfl:    metoprolol  succinate (TOPROL -XL) 25 MG 24 hr tablet, Take 1 tablet (25 mg total) by mouth daily., Disp: 30 tablet, Rfl: 3   MULTIPLE VITAMIN PO, Take 1 tablet by mouth daily. 1 Gummy daily, Disp: , Rfl:    sacubitril -valsartan  (ENTRESTO ) 24-26 MG, Take 1 tablet by mouth 2 (two) times daily., Disp: 60 tablet, Rfl: 3   spironolactone  (ALDACTONE ) 25 MG tablet, Take 0.5 tablets (12.5 mg total) by mouth daily., Disp: 15 tablet, Rfl: 3  Past Medical History: No past medical history on file.  Tobacco Use: Social History   Tobacco Use  Smoking Status Former   Current packs/day: 0.50   Types: Cigarettes  Smokeless Tobacco Never    Labs: Review Flowsheet       Latest Ref Rng & Units 06/27/2023 06/29/2023 07/01/2023 07/13/2023  Labs for ITP Cardiac and Pulmonary Rehab  Cholestrol 0 - 200 mg/dL - 898  844  -  LDL (calc) 0 - 99 mg/dL - 64  897  -  HDL-C >59 mg/dL - 20  33  -  Trlycerides <150 mg/dL - 85  98  -  Hemoglobin A1c 4.8 - 5.6 % - - - 5.2   PH, Arterial 7.35 - 7.45 7.35  - - -  PCO2 arterial 32 - 48 mmHg <18  - - -  Bicarbonate 20.0 - 28.0 mmol/L 8.6  28.7  25.6  25.9  - -  TCO2 22 - 32 mmol/L - 30  27  27   -  -  Acid-base deficit 0.0 - 2.0 mmol/L 14.4  - - -  O2 Saturation % 100  59  68  98  - -    Details       Multiple values from one day are sorted in reverse-chronological order         Capillary Blood Glucose: No results found for: GLUCAP   Exercise Target Goals: Exercise Program Goal: Individual exercise prescription set using results from initial 6 min walk test and THRR while considering  patient's activity barriers and safety.   Exercise Prescription Goal: Starting with aerobic activity 30 plus minutes a day, 3 days per week for initial exercise prescription. Provide home exercise prescription and guidelines that participant acknowledges understanding prior to discharge.  Activity Barriers & Risk Stratification:  Activity Barriers & Cardiac Risk Stratification - 07/30/23 1400       Activity Barriers & Cardiac Risk Stratification   Activity Barriers None    Cardiac Risk Stratification Moderate             6 Minute Walk:  6 Minute Walk     Row Name 07/30/23 1512         6 Minute Walk  Distance 990 feet     Walk Time 6 minutes     # of Rest Breaks 0     MPH 1.88     METS 3.45     RPE 11     Perceived Dyspnea  0     VO2 Peak 12.07     Symptoms No     Resting HR 95 bpm     Resting BP 96/64     Resting Oxygen Saturation  97 %     Exercise Oxygen Saturation  during 6 min walk 98 %     Max Ex. HR 102 bpm     Max Ex. BP 112/60     2 Minute Post BP 100/60              Oxygen Initial Assessment:   Oxygen Re-Evaluation:   Oxygen Discharge (Final Oxygen Re-Evaluation):   Initial Exercise Prescription:  Initial Exercise Prescription - 07/30/23 1500       Date of Initial Exercise RX and Referring Provider   Date 07/30/23    Referring Provider Trixie Hilda MD      Oxygen   Maintain Oxygen Saturation 88% or higher      Treadmill   MPH 1.5    Grade 0    Minutes 15    METs 2.3      REL-XR   Level 2    Speed 50    Minutes 15     METs 2      Prescription Details   Frequency (times per week) 3    Duration Progress to 30 minutes of continuous aerobic without signs/symptoms of physical distress      Intensity   THRR 40-80% of Max Heartrate 125-155    Ratings of Perceived Exertion 11-13    Perceived Dyspnea 0-4      Resistance Training   Training Prescription Yes    Weight 4    Reps 10-15             Perform Capillary Blood Glucose checks as needed.  Exercise Prescription Changes:   Exercise Prescription Changes     Row Name 07/30/23 1500             Response to Exercise   Blood Pressure (Admit) 96/64       Blood Pressure (Exercise) 112/60       Blood Pressure (Exit) 100/60       Heart Rate (Admit) 95 bpm       Heart Rate (Exercise) 102 bpm       Heart Rate (Exit) 98 bpm       Oxygen Saturation (Admit) 97 %       Oxygen Saturation (Exercise) 98 %       Oxygen Saturation (Exit) 97 %       Rating of Perceived Exertion (Exercise) 11       Perceived Dyspnea (Exercise) 0                Exercise Comments:   Exercise Goals and Review:   Exercise Goals     Row Name 07/30/23 1516             Exercise Goals   Increase Physical Activity Yes       Intervention Provide advice, education, support and counseling about physical activity/exercise needs.;Develop an individualized exercise prescription for aerobic and resistive training based on initial evaluation findings, risk stratification, comorbidities and participant's personal goals.       Expected Outcomes  Short Term: Attend rehab on a regular basis to increase amount of physical activity.;Long Term: Add in home exercise to make exercise part of routine and to increase amount of physical activity.;Long Term: Exercising regularly at least 3-5 days a week.       Increase Strength and Stamina Yes       Intervention Provide advice, education, support and counseling about physical activity/exercise needs.;Develop an individualized exercise  prescription for aerobic and resistive training based on initial evaluation findings, risk stratification, comorbidities and participant's personal goals.       Expected Outcomes Short Term: Increase workloads from initial exercise prescription for resistance, speed, and METs.;Long Term: Improve cardiorespiratory fitness, muscular endurance and strength as measured by increased METs and functional capacity ( );Short Term: Perform resistance training exercises routinely during rehab and add in resistance training at home       Able to understand and use rate of perceived exertion (RPE) scale Yes       Intervention Provide education and explanation on how to use RPE scale       Expected Outcomes Short Term: Able to use RPE daily in rehab to express subjective intensity level;Long Term:  Able to use RPE to guide intensity level when exercising independently       Able to understand and use Dyspnea scale Yes       Intervention Provide education and explanation on how to use Dyspnea scale       Expected Outcomes Short Term: Able to use Dyspnea scale daily in rehab to express subjective sense of shortness of breath during exertion;Long Term: Able to use Dyspnea scale to guide intensity level when exercising independently       Knowledge and understanding of Target Heart Rate Range (THRR) Yes       Intervention Provide education and explanation of THRR including how the numbers were predicted and where they are located for reference       Expected Outcomes Short Term: Able to state/look up THRR;Short Term: Able to use daily as guideline for intensity in rehab;Long Term: Able to use THRR to govern intensity when exercising independently       Able to check pulse independently Yes       Intervention Provide education and demonstration on how to check pulse in carotid and radial arteries.;Review the importance of being able to check your own pulse for safety during independent exercise       Expected Outcomes  Short Term: Able to explain why pulse checking is important during independent exercise;Long Term: Able to check pulse independently and accurately       Understanding of Exercise Prescription Yes       Intervention Provide education, explanation, and written materials on patient's individual exercise prescription       Expected Outcomes Short Term: Able to explain program exercise prescription;Long Term: Able to explain home exercise prescription to exercise independently                Exercise Goals Re-Evaluation :    Discharge Exercise Prescription (Final Exercise Prescription Changes):  Exercise Prescription Changes - 07/30/23 1500       Response to Exercise   Blood Pressure (Admit) 96/64    Blood Pressure (Exercise) 112/60    Blood Pressure (Exit) 100/60    Heart Rate (Admit) 95 bpm    Heart Rate (Exercise) 102 bpm    Heart Rate (Exit) 98 bpm    Oxygen Saturation (Admit) 97 %    Oxygen Saturation (  Exercise) 98 %    Oxygen Saturation (Exit) 97 %    Rating of Perceived Exertion (Exercise) 11    Perceived Dyspnea (Exercise) 0             Nutrition:  Target Goals: Understanding of nutrition guidelines, daily intake of sodium 1500mg , cholesterol 200mg , calories 30% from fat and 7% or less from saturated fats, daily to have 5 or more servings of fruits and vegetables.  Biometrics:  Pre Biometrics - 07/30/23 1517       Pre Biometrics   Height 5' 3 (1.6 m)    Weight 60.5 kg    Waist Circumference 35 inches    Hip Circumference 36.5 inches    Waist to Hip Ratio 0.96 %    BMI (Calculated) 23.63    Grip Strength 20.6 kg    Single Leg Stand 15.64 seconds              Nutrition Therapy Plan and Nutrition Goals:   Nutrition Assessments:  MEDIFICTS Score Key: >=70 Need to make dietary changes  40-70 Heart Healthy Diet <= 40 Therapeutic Level Cholesterol Diet  Flowsheet Row CARDIAC REHAB PHASE II ORIENTATION from 07/30/2023 in [facility] CARDIAC  REHABILITATION  Picture Your Plate Total Score on Admission 27      Picture Your Plate Scores: <59 Unhealthy dietary pattern with much room for improvement. 41-50 Dietary pattern unlikely to meet recommendations for good health and room for improvement. 51-60 More healthful dietary pattern, with some room for improvement.  >60 Healthy dietary pattern, although there may be some specific behaviors that could be improved.    Nutrition Goals Re-Evaluation:   Nutrition Goals Discharge (Final Nutrition Goals Re-Evaluation):   Psychosocial: Target Goals: Acknowledge presence or absence of significant depression and/or stress, maximize coping skills, provide positive support system. Participant is able to verbalize types and ability to use techniques and skills needed for reducing stress and depression.  Initial Review & Psychosocial Screening:  Initial Psych Review & Screening - 07/30/23 1511       Initial Review   Current issues with Current Stress Concerns    Source of Stress Concerns Family    Comments Patient's in-laws recently moved closer and this has been a stressor for patient.      Family Dynamics   Good Support System? Yes    Comments Patient's husband, mother, and a close friend are her support.      Barriers   Psychosocial barriers to participate in program There are no identifiable barriers or psychosocial needs.      Screening Interventions   Interventions Encouraged to exercise;To provide support and resources with identified psychosocial needs;Provide feedback about the scores to participant    Expected Outcomes Long Term goal: The participant improves quality of Life and PHQ9 Scores as seen by post scores and/or verbalization of changes;Short Term goal: Identification and review with participant of any Quality of Life or Depression concerns found by scoring the questionnaire.;Long Term Goal: Stressors or current issues are controlled or eliminated.;Short Term goal:  Utilizing psychosocial counselor, staff and physician to assist with identification of specific Stressors or current issues interfering with healing process. Setting desired goal for each stressor or current issue identified.             Quality of Life Scores:  Scores of 19 and below usually indicate a poorer quality of life in these areas.  A difference of  2-3 points is a clinically meaningful difference.  A difference of  2-3 points in the total score of the Quality of Life Index has been associated with significant improvement in overall quality of life, self-image, physical symptoms, and general health in studies assessing change in quality of life.  PHQ-9: Review Flowsheet       07/30/2023  Depression screen PHQ 2/9  Decreased Interest 0  Down, Depressed, Hopeless 0  PHQ - 2 Score 0  Altered sleeping 0  Tired, decreased energy 0  Change in appetite 0  Feeling bad or failure about yourself  0  Trouble concentrating 0  Moving slowly or fidgety/restless 0  Suicidal thoughts 0  PHQ-9 Score 0   Interpretation of Total Score  Total Score Depression Severity:  1-4 = Minimal depression, 5-9 = Mild depression, 10-14 = Moderate depression, 15-19 = Moderately severe depression, 20-27 = Severe depression   Psychosocial Evaluation and Intervention:  Psychosocial Evaluation - 07/30/23 1516       Psychosocial Evaluation & Interventions   Interventions Stress management education;Relaxation education;Encouraged to exercise with the program and follow exercise prescription    Comments Patient was referred to CR with NSTEMI. Her PHQ-9 score was 0. She lives with her husband and 2 school age children. She denies any depression or anxiety. She says her in-laws recently moved closer to them from out of town and this has been a stressor for her and her husband and her MI and recent onset of HF has also been a stressor. She recently quit smoking after her MI without any aides. Her husband still  smokes but he does not smoke around her. She feels she is managing this well. She says she sleeps well. 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 She plans to return to her work soon. She is also very involved with kids school activities. Her goals for the program are to get stronger and healthier overall and to learn how to eat healthier. She has no barriers identified to complete the program. She is ready to start the program.    Expected Outcomes Short Term: start the program and attend consistently. Long Term: Complete the program meeting her personal goals.    Continue Psychosocial Services  Follow up required by staff             Psychosocial Re-Evaluation:   Psychosocial Discharge (Final Psychosocial Re-Evaluation):   Vocational Rehabilitation: Provide vocational rehab assistance to qualifying candidates.   Vocational Rehab Evaluation & Intervention:  Vocational Rehab - 07/30/23 1513       Initial Vocational Rehab Evaluation & Intervention   Assessment shows need for Vocational Rehabilitation No      Vocational Rehab Re-Evaulation   Comments Patient plans to return to substitute teaching.             Education: Education Goals: Education classes will be provided on a weekly basis, covering required topics. Participant will state understanding/return demonstration of topics presented.  Learning Barriers/Preferences:  Learning Barriers/Preferences - 07/30/23 1414       Learning Barriers/Preferences   Learning Barriers Sight    Learning Preferences Skilled Demonstration;Audio             Education Topics: Hypertension, Hypertension Reduction -Define heart disease and high blood pressure. Discus how high blood pressure affects the body and ways to reduce high blood pressure.   Exercise and Your Heart -Discuss why it is important to exercise, the FITT principles of exercise, normal and abnormal responses to  exercise, and how to  exercise safely.   Angina -Discuss definition of angina, causes of angina, treatment of angina, and how to decrease risk of having angina.   Cardiac Medications -Review what the following cardiac medications are used for, how they affect the body, and side effects that may occur when taking the medications.  Medications include Aspirin , Beta blockers, calcium  channel blockers, ACE Inhibitors, angiotensin receptor blockers, diuretics, digoxin, and antihyperlipidemics.   Congestive Heart Failure -Discuss the definition of CHF, how to live with CHF, the signs and symptoms of CHF, and how keep track of weight and sodium intake.   Heart Disease and Intimacy -Discus the effect sexual activity has on the heart, how changes occur during intimacy as we age, and safety during sexual activity.   Smoking Cessation / COPD -Discuss different methods to quit smoking, the health benefits of quitting smoking, and the definition of COPD.   Nutrition I: Fats -Discuss the types of cholesterol, what cholesterol does to the heart, and how cholesterol levels can be controlled.   Nutrition II: Labels -Discuss the different components of food labels and how to read food label   Heart Parts/Heart Disease and PAD -Discuss the anatomy of the heart, the pathway of blood circulation through the heart, and these are affected by heart disease.   Stress I: Signs and Symptoms -Discuss the causes of stress, how stress may lead to anxiety and depression, and ways to limit stress.   Stress II: Relaxation -Discuss different types of relaxation techniques to limit stress.   Warning Signs of Stroke / TIA -Discuss definition of a stroke, what the signs and symptoms are of a stroke, and how to identify when someone is having stroke.   Knowledge Questionnaire Score:  Knowledge Questionnaire Score - 07/30/23 1504       Knowledge Questionnaire Score   Pre Score 22/24              Core Components/Risk Factors/Patient Goals at Admission:  Personal Goals and Risk Factors at Admission - 07/30/23 1503       Core Components/Risk Factors/Patient Goals on Admission    Weight Management Weight Maintenance    Heart Failure Yes    Intervention Provide a combined exercise and nutrition program that is supplemented with education, support and counseling about heart failure. Directed toward relieving symptoms such as shortness of breath, decreased exercise tolerance, and extremity edema.    Expected Outcomes Improve functional capacity of life;Short term: Attendance in program 2-3 days a week with increased exercise capacity. Reported lower sodium intake. Reported increased fruit and vegetable intake. Reports medication compliance.;Short term: Daily weights obtained and reported for increase. Utilizing diuretic protocols set by physician.;Long term: Adoption of self-care skills and reduction of barriers for early signs and symptoms recognition and intervention leading to self-care maintenance.    Stress Yes    Intervention Offer individual and/or small group education and counseling on adjustment to heart disease, stress management and health-related lifestyle change. Teach and support self-help strategies.;Refer participants experiencing significant psychosocial distress to appropriate mental health specialists for further evaluation and treatment. When possible, include family members and significant others in education/counseling sessions.    Expected Outcomes Short Term: Participant demonstrates changes in health-related behavior, relaxation and other stress management skills, ability to obtain effective social support, and compliance with psychotropic medications if prescribed.;Long Term: Emotional wellbeing is indicated by absence of clinically significant psychosocial distress or social isolation.             Core Components/Risk Factors/Patient Goals Review:  Core  Components/Risk Factors/Patient Goals at Discharge (Final Review):    ITP Comments:   Comments: Patient arrived for 1st visit/orientation/education at 1330. Patient was referred to CR by Maude Emmer, MD due to NSTEMI. During orientation advised patient on arrival and appointment times what to wear, what to do before, during and after exercise. Reviewed attendance and class policy.  Pt is scheduled to return Cardiac Rehab on 07/30/22 at 915. Pt was advised to come to class 15 minutes before class starts.  Discussed RPE/Dpysnea scales. Patient participated in warm up stretches. Patient was able to complete 6 minute walk test.  Telemetry:NSR. Patient was measured for the equipment. Discussed equipment safety with patient. Took patient pre-anthropometric measurements. Patient finished visit at 1500.

## 2023-07-31 ENCOUNTER — Other Ambulatory Visit (HOSPITAL_COMMUNITY): Payer: Self-pay | Admitting: Family Medicine

## 2023-07-31 ENCOUNTER — Other Ambulatory Visit (HOSPITAL_COMMUNITY): Payer: Self-pay | Admitting: Cardiology

## 2023-07-31 ENCOUNTER — Encounter (HOSPITAL_COMMUNITY)
Admission: RE | Admit: 2023-07-31 | Discharge: 2023-07-31 | Disposition: A | Discharge status: Home / Self Care | Payer: 59 | Source: Ambulatory Visit | Attending: Cardiology | Admitting: Cardiology

## 2023-07-31 DIAGNOSIS — I5020 Unspecified systolic (congestive) heart failure: Secondary | ICD-10-CM | POA: Diagnosis present

## 2023-07-31 DIAGNOSIS — I272 Pulmonary hypertension, unspecified: Secondary | ICD-10-CM | POA: Diagnosis present

## 2023-07-31 DIAGNOSIS — Z720 Tobacco use: Secondary | ICD-10-CM | POA: Diagnosis present

## 2023-07-31 DIAGNOSIS — R7989 Other specified abnormal findings of blood chemistry: Secondary | ICD-10-CM | POA: Diagnosis present

## 2023-07-31 DIAGNOSIS — I214 Non-ST elevation (NSTEMI) myocardial infarction: Secondary | ICD-10-CM

## 2023-07-31 DIAGNOSIS — I493 Ventricular premature depolarization: Secondary | ICD-10-CM | POA: Diagnosis present

## 2023-07-31 DIAGNOSIS — I2510 Atherosclerotic heart disease of native coronary artery without angina pectoris: Secondary | ICD-10-CM | POA: Diagnosis present

## 2023-07-31 DIAGNOSIS — I351 Nonrheumatic aortic (valve) insufficiency: Secondary | ICD-10-CM | POA: Diagnosis present

## 2023-07-31 DIAGNOSIS — I340 Nonrheumatic mitral (valve) insufficiency: Secondary | ICD-10-CM | POA: Diagnosis present

## 2023-07-31 NOTE — Progress Notes (Signed)
 Daily Session Note  Patient Details  Name: Jennifer King MRN: [id number] Date of Birth: 1971/01/04 Referring Provider:   Flowsheet Row CARDIAC REHAB PHASE II ORIENTATION from 07/30/2023 in [facility] CARDIAC REHABILITATION  Referring Provider Trixie Hilda MD       Encounter Date: 07/31/2023  Check In:  Session Check In - 07/31/23 9072       Check-In   Supervising physician immediately available to respond to emergencies See telemetry face sheet for immediately available MD    Location [facility]    Staff Present Powell Benders, BS, Exercise Physiologist;Tian Mcmurtrey Vonzell, MA, RCEP, CCRP, Sueellen Louder, RN, BSN    Virtual Visit No    Medication changes reported     No    Fall or balance concerns reported    No    Warm-up and Cool-down Performed on first and last piece of equipment    Resistance Training Performed Yes    VAD Patient? No    PAD/SET Patient? No      Pain Assessment   Currently in Pain? No/denies             Capillary Blood Glucose: No results found for this or any previous visit (from the past 24 hours).    Social History   Tobacco Use  Smoking Status Former   Current packs/day: 0.50   Types: Cigarettes  Smokeless Tobacco Never    Goals Met:  Exercise tolerated well Personal goals reviewed No report of concerns or symptoms today Strength training completed today  Goals Unmet:  Not Applicable  Comments: First full day of exercise!  Patient was oriented to gym and equipment including functions, settings, policies, and procedures.  Patient's individual exercise prescription and treatment plan were reviewed.  All starting workloads were established based on the results of the 6 minute walk test done at initial orientation visit.  The plan for exercise progression was also introduced and progression will be customized based on patient's performance and goals.

## 2023-08-03 ENCOUNTER — Encounter (HOSPITAL_COMMUNITY)
Admission: RE | Admit: 2023-08-03 | Discharge: 2023-08-03 | Disposition: A | Discharge status: Home / Self Care | Payer: 59 | Source: Ambulatory Visit | Attending: Cardiology | Admitting: Cardiology

## 2023-08-03 DIAGNOSIS — I214 Non-ST elevation (NSTEMI) myocardial infarction: Secondary | ICD-10-CM

## 2023-08-03 NOTE — Progress Notes (Signed)
 I have reviewed a Home Exercise Prescription with Jennifer King . Jennifer King is not currently exercising at home but we talked about walking her treadmill when not in rehab. The patient was advised to walk 2 days a week for 30-45 minutes.  Jennifer and I discussed how to progress their exercise prescription.  The patient stated that their goals were to build up her endurance and stamina.  The patient stated that they understand the exercise prescription.  We reviewed exercise guidelines, target heart rate during exercise, RPE Scale, weather conditions, NTG use, endpoints for exercise, warmup and cool down.  Patient is encouraged to come to me with any questions. I will continue to follow up with the patient to assist them with progression and safety.

## 2023-08-03 NOTE — Progress Notes (Addendum)
ADVANCED HF CLINIC NOTE   Referring Physician: Dr. Anne Fu Primary Care: Patient, No Pcp Per Primary Cardiologist: Dr. Eden Emms  CC: chronic systolic HF f/u  HPI: Jennifer King is a 53 y.o. white female with past medical history of tobacco abuse and recently diagnosed systolic heart failure and CAD.   Admitted 12/24 with PNA. HsTroponin 1168 >> 1033. ECG showed ST with inferior Q waves. Cardiology was consulted, echo showed EF 25-30%, G2DD, normal RV, moderate to severe MR and moderate AI. Underwent R/LHC showing high-grade mid right RCA lesion with occluded right posterolateral ventricular branch, with mild mid LAD disease. Given lack of reproducible angina and unclear benefit of revascularization, medical therapy was elected. RHC showed elevated filling pressures, preserved CI of 2.7 and PAPi 1.4, PVR 2.7 WU. GDMT titrated and she was discharged home, weight 130 lbs.  Today she returns for AHF follow up. Overall feeling good. Denies palpitations, CP, dizziness, edema, or PND/Orthopnea. Denies SOB. Appetite ok. No fever or chills. Weight at home 132 pounds. Taking all medications. Denies ETOH, tobacco or drug use. Works as a Lawyer. Going to cardiac rehab.   Cardiac Testing  - R/LHC (12/24): high-grade mid right RCA lesion with occluded right posterolateral ventricular branch, with mild mid LAD disease RA 15, PA 48/27 (36), PCWP 24 (v waves to 31), CO/CI (Fick) 4.4/2.7, PAPi 1.4, PVR 2.7 WU.  - Echo (12/24): EF 25-30%, G2DD, normal RV, moderate to severe MR and moderate AI.  Family Hx: Grandfather with MI. Social Hx: smoker (stopped 05/2023).  No past medical history on file.  Current Outpatient Medications  Medication Sig Dispense Refill   aspirin EC 81 MG tablet Take 1 tablet (81 mg total) by mouth daily. Swallow whole. 30 tablet 11   diphenhydrAMINE (BENADRYL) 25 mg capsule Take 25 mg by mouth as needed.     metoprolol succinate (TOPROL-XL) 25 MG 24 hr tablet TAKE 1  TABLET (25 MG TOTAL) BY MOUTH DAILY. 90 tablet 3   MULTIPLE VITAMIN PO Take 1 tablet by mouth daily. 1 Gummy daily     sacubitril-valsartan (ENTRESTO) 24-26 MG Take 1 tablet by mouth 2 (two) times daily. 60 tablet 3   spironolactone (ALDACTONE) 25 MG tablet Take 0.5 tablets (12.5 mg total) by mouth daily. 15 tablet 3   No current facility-administered medications for this encounter.   Allergies  Allergen Reactions   Sulfa Antibiotics Hives   Jardiance [Empagliflozin] Rash   Social History   Socioeconomic History   Marital status: Married    Spouse name: Joe   Number of children: Not on file   Years of education: Not on file   Highest education level: Associate degree: academic program  Occupational History   Occupation: Educational psychologist  Tobacco Use   Smoking status: Former    Current packs/day: 0.50    Types: Cigarettes   Smokeless tobacco: Never  Vaping Use   Vaping status: Never Used  Substance and Sexual Activity   Alcohol use: Not Currently   Drug use: Not Currently   Sexual activity: Not on file  Other Topics Concern   Not on file  Social History Narrative   Not on file   Social Drivers of Health   Financial Resource Strain: Low Risk  (06/29/2023)   Overall Financial Resource Strain (CARDIA)    Difficulty of Paying Living Expenses: Not hard at all  Food Insecurity: No Food Insecurity (06/27/2023)   Hunger Vital Sign    Worried About Programme researcher, broadcasting/film/video in  the Last Year: Never true    Ran Out of Food in the Last Year: Never true  Transportation Needs: No Transportation Needs (06/27/2023)   PRAPARE - Administrator, Civil Service (Medical): No    Lack of Transportation (Non-Medical): No  Physical Activity: Not on file  Stress: No Stress Concern Present (05/16/2019)   Received from [facility], [facility] - Occupational Stress Questionnaire    Feeling of Stress : Not at all  Social Connections: Not  on file  Intimate Partner Violence: Not At Risk (06/27/2023)   Humiliation, Afraid, Rape, and Kick questionnaire    Fear of Current or Ex-Partner: No    Emotionally Abused: No    Physically Abused: No    Sexually Abused: No    Family History  Problem Relation Age of Onset   Healthy Mother    Healthy Father    BP 112/78   Pulse 93   Wt 60.7 kg (133 lb 12.8 oz)   SpO2 97%   BMI 23.70 kg/m   Wt Readings from Last 3 Encounters:  08/10/23 60.7 kg (133 lb 12.8 oz)  07/30/23 60.5 kg (133 lb 6.1 oz)  07/13/23 59.9 kg (132 lb)   PHYSICAL EXAM: General:  well appearing.  No respiratory difficulty. Walked into clinic HEENT: normal Neck: supple. JVD flat. Carotids 2+ bilat; no bruits. No lymphadenopathy or thyromegaly appreciated. Cor: PMI nondisplaced. Regular rate & rhythm. No rubs, gallops or murmurs. Lungs: clear Abdomen: soft, nontender, nondistended. No hepatosplenomegaly. No bruits or masses. Good bowel sounds. Extremities: no cyanosis, clubbing, rash, edema  Neuro: alert & oriented x 3, cranial nerves grossly intact. moves all 4 extremities w/o difficulty. Affect pleasant.   ECG (personally reviewed from 12/24): NSR with PVC, 91 bpm  ASSESSMENT & PLAN: Chronic Systolic Heart Failure - Echo (12/24): EF 25-30%, G2DD, normal RV, moderate to severe MR and moderate AI.  - R/LHC (12/24): high-grade mid right RCA lesion with occluded right posterolateral ventricular branch, with mild mid LAD disease; RHC showed mild pulmonary hypertension, normal CO.  - Etiology unclear, lactate 8 on admission. ? Myopathy of critical illness vs myocarditis. Had URI symptoms proceeding hospitalization. CRP 5.8, ESR normal.  - Arrange cMRI to evaluate for evidence of inflammation or infiltrative disease. - NYHA I, volume OK today. Does not need a loop  - Off Jardiance with rash/vaginal itchiness. Consider trial of Farxiga, but will wait a couple weeks.  - Increase spironolactone 12.5>25 mg q hs.  BMET/BNP today. Repeat BMET 7-10 days - Continue Entresto 24/26 mg bid. - Continue Toprol XL 25 mg daily. - Has been going to Cardiac Rehab - Plan to repeat echo at next visit.  CAD - LHC with RCA and LAD lesion; -->treating medically.  - Her OCPs were stopped during admission. - No chest pain - Continue ASA - No statin with elevated LFTs. LDL 102 07/01/23. LFTS WNL now. Will check CMET today. Start atorvastatin 20 mg daily. Plan to repeat lipid panel in ~3 months  - CR as above  3. AI/MR - Echo (12/23): AoV mean gradient 4.0 mmHg, AoV peak gradient 6.2 mmHg, AVA by VTI  1.95 cm.  - Follow  4. 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 Elevated LFTs - Likely related to hepatic congestion - WNL 07/13/23  6. PVCs - Asymptomatic. She snores - Sleep study as above   7. Tobacco Use - Smoker since age 40 - Quit 11/24. Congratulated.  Follow up in 2 months as scheduled with Dr. Elwyn Lade + echo  Brynda Peon, AGACNP-BC  08/10/23

## 2023-08-03 NOTE — Progress Notes (Signed)
 Daily Session Note  Patient Details  Name: Jennifer King MRN: [id number] Date of Birth: 1971/02/27 Referring Provider:   Flowsheet Row CARDIAC REHAB PHASE II ORIENTATION from 07/30/2023 in [facility] CARDIAC REHABILITATION  Referring Provider Trixie Hilda MD       Encounter Date: 08/03/2023  Check In:  Session Check In - 08/03/23 0915       Check-In   Supervising physician immediately available to respond to emergencies See telemetry face sheet for immediately available MD    Location [facility]    Staff Present Powell Benders, BS, Exercise Physiologist;Jessica Vonzell, MA, RCEP, CCRP, CCET    Virtual Visit No    Medication changes reported     No    Fall or balance concerns reported    No    Tobacco Cessation No Change    Warm-up and Cool-down Performed on first and last piece of equipment    Resistance Training Performed Yes    VAD Patient? No    PAD/SET Patient? No      Pain Assessment   Currently in Pain? No/denies    Multiple Pain Sites No             Capillary Blood Glucose: No results found for this or any previous visit (from the past 24 hours).    Social History   Tobacco Use  Smoking Status Former   Current packs/day: 0.50   Types: Cigarettes  Smokeless Tobacco Never    Goals Met:  Independence with exercise equipment Exercise tolerated well No report of concerns or symptoms today Strength training completed today  Goals Unmet:  Not Applicable  Comments: Pt able to follow exercise prescription today without complaint.  Will continue to monitor for progression.

## 2023-08-04 ENCOUNTER — Telehealth (HOSPITAL_COMMUNITY): Payer: Self-pay

## 2023-08-04 NOTE — Telephone Encounter (Signed)
 Spoke to patient she is aware that she can do [facility] home sleep study.

## 2023-08-05 ENCOUNTER — Encounter (HOSPITAL_COMMUNITY)
Admission: RE | Admit: 2023-08-05 | Discharge: 2023-08-05 | Discharge status: Home / Self Care | Payer: 59 | Source: Ambulatory Visit | Attending: Cardiology

## 2023-08-05 DIAGNOSIS — I214 Non-ST elevation (NSTEMI) myocardial infarction: Secondary | ICD-10-CM

## 2023-08-05 NOTE — Progress Notes (Signed)
 Daily Session Note  Patient Details  Name: Jennifer King MRN: [id number] Date of Birth: 26-Jul-1970 Referring Provider:   Flowsheet Row CARDIAC REHAB PHASE II ORIENTATION from 07/30/2023 in [facility] CARDIAC REHABILITATION  Referring Provider Kathlen Para MD       Encounter Date: 08/05/2023  Check In:  Session Check In - 08/05/23 0915       Check-In   Supervising physician immediately available to respond to emergencies See telemetry face sheet for immediately available MD    Location [facility]    Staff Present Clotilda Danish, BS, Exercise Physiologist;Jessica Zoila Hines, MA, RCEP, CCRP, CCET    Virtual Visit No    Medication changes reported     No    Fall or balance concerns reported    No    Tobacco Cessation No Change    Warm-up and Cool-down Performed on first and last piece of equipment    Resistance Training Performed Yes    VAD Patient? No    PAD/SET Patient? No      Pain Assessment   Currently in Pain? No/denies    Multiple Pain Sites No             Capillary Blood Glucose: No results found for this or any previous visit (from the past 24 hours).    Social History   Tobacco Use  Smoking Status Former   Current packs/day: 0.50   Types: Cigarettes  Smokeless Tobacco Never    Goals Met:  Independence with exercise equipment Exercise tolerated well No report of concerns or symptoms today Strength training completed today  Goals Unmet:  Not Applicable  Comments: Pt able to follow exercise prescription today without complaint.  Will continue to monitor for progression.

## 2023-08-06 ENCOUNTER — Ambulatory Visit: Payer: 59 | Admitting: Physician Assistant

## 2023-08-07 ENCOUNTER — Encounter (HOSPITAL_COMMUNITY)
Admission: RE | Admit: 2023-08-07 | Discharge: 2023-08-07 | Disposition: A | Discharge status: Home / Self Care | Payer: 59 | Source: Ambulatory Visit | Attending: Cardiology | Admitting: Cardiology

## 2023-08-07 DIAGNOSIS — I214 Non-ST elevation (NSTEMI) myocardial infarction: Secondary | ICD-10-CM

## 2023-08-07 NOTE — Progress Notes (Signed)
Daily Session Note  Patient Details  Name: Jennifer King MRN: [id number] Date of Birth: [birth year] Referring Provider:   Flowsheet Row CARDIAC REHAB PHASE II ORIENTATION from 07/30/2023 in [facility] CARDIAC REHABILITATION  Referring Provider Pamella Pert MD       Encounter Date: 08/07/2023  Check In:  Session Check In - 08/07/23 0915       Check-In   Supervising physician immediately available to respond to emergencies See telemetry face sheet for immediately available MD    Location [facility]    Staff Present Ross Ludwig, BS, Exercise Physiologist;Jessica Juanetta Gosling, MA, RCEP, CCRP, CCET    Virtual Visit No    Medication changes reported     No    Fall or balance concerns reported    No    Tobacco Cessation No Change    Warm-up and Cool-down Performed on first and last piece of equipment    Resistance Training Performed Yes      Pain Assessment   Currently in Pain? No/denies    Multiple Pain Sites No             Capillary Blood Glucose: No results found for this or any previous visit (from the past 24 hours).    Social History   Tobacco Use  Smoking Status Former   Current packs/day: 0.50   Types: Cigarettes  Smokeless Tobacco Never    Goals Met:  Independence with exercise equipment Exercise tolerated well No report of concerns or symptoms today Strength training completed today  Goals Unmet:  Not Applicable  Comments: Pt able to follow exercise prescription today without complaint.  Will continue to monitor for progression.

## 2023-08-10 ENCOUNTER — Ambulatory Visit (HOSPITAL_BASED_OUTPATIENT_CLINIC_OR_DEPARTMENT_OTHER)
Admission: RE | Admit: 2023-08-10 | Discharge: 2023-08-10 | Disposition: A | Discharge status: Home / Self Care | Payer: 59 | Source: Ambulatory Visit | Attending: Internal Medicine | Admitting: Internal Medicine

## 2023-08-10 ENCOUNTER — Encounter (HOSPITAL_COMMUNITY): Payer: Self-pay

## 2023-08-10 ENCOUNTER — Encounter (HOSPITAL_COMMUNITY)
Admission: RE | Admit: 2023-08-10 | Discharge: 2023-08-10 | Disposition: A | Discharge status: Home / Self Care | Payer: 59 | Source: Ambulatory Visit | Attending: Cardiology

## 2023-08-10 VITALS — BP 112/78 | HR 93 | Wt 133.8 lb

## 2023-08-10 DIAGNOSIS — I214 Non-ST elevation (NSTEMI) myocardial infarction: Secondary | ICD-10-CM | POA: Diagnosis not present

## 2023-08-10 DIAGNOSIS — I5020 Unspecified systolic (congestive) heart failure: Secondary | ICD-10-CM

## 2023-08-10 DIAGNOSIS — I5022 Chronic systolic (congestive) heart failure: Secondary | ICD-10-CM | POA: Insufficient documentation

## 2023-08-10 DIAGNOSIS — R7989 Other specified abnormal findings of blood chemistry: Secondary | ICD-10-CM

## 2023-08-10 DIAGNOSIS — Z720 Tobacco use: Secondary | ICD-10-CM | POA: Insufficient documentation

## 2023-08-10 DIAGNOSIS — I340 Nonrheumatic mitral (valve) insufficiency: Secondary | ICD-10-CM

## 2023-08-10 DIAGNOSIS — R21 Rash and other nonspecific skin eruption: Secondary | ICD-10-CM | POA: Insufficient documentation

## 2023-08-10 DIAGNOSIS — I493 Ventricular premature depolarization: Secondary | ICD-10-CM | POA: Insufficient documentation

## 2023-08-10 DIAGNOSIS — Z79899 Other long term (current) drug therapy: Secondary | ICD-10-CM | POA: Insufficient documentation

## 2023-08-10 DIAGNOSIS — I351 Nonrheumatic aortic (valve) insufficiency: Secondary | ICD-10-CM | POA: Diagnosis not present

## 2023-08-10 DIAGNOSIS — I2510 Atherosclerotic heart disease of native coronary artery without angina pectoris: Secondary | ICD-10-CM | POA: Insufficient documentation

## 2023-08-10 DIAGNOSIS — I272 Pulmonary hypertension, unspecified: Secondary | ICD-10-CM | POA: Insufficient documentation

## 2023-08-10 LAB — COMPREHENSIVE METABOLIC PANEL
ALT: 31 U/L (ref 0–44)
AST: 29 U/L (ref 15–41)
Albumin: 3.6 g/dL (ref 3.5–5.0)
Alkaline Phosphatase: 65 U/L (ref 38–126)
Anion gap: 8 (ref 5–15)
BUN: 13 mg/dL (ref 6–20)
CO2: 26 mmol/L (ref 22–32)
Calcium: 9.8 mg/dL (ref 8.9–10.3)
Chloride: 105 mmol/L (ref 98–111)
Creatinine, Ser: 0.81 mg/dL (ref 0.44–1.00)
GFR, Estimated: >60 mL/min (ref >60)
Glucose, Bld: 100 mg/dL — ABNORMAL HIGH (ref 70–99)
Potassium: 4.8 mmol/L (ref 3.5–5.1)
Sodium: 139 mmol/L (ref 135–145)
Total Bilirubin: 0.8 mg/dL (ref 0.0–1.2)
Total Protein: 7.2 g/dL (ref 6.5–8.1)

## 2023-08-10 LAB — CBC
HCT: 46 % (ref 36.0–46.0)
Hemoglobin: 15 g/dL (ref 12.0–15.0)
MCH: 28.5 pg (ref 26.0–34.0)
MCHC: 32.6 g/dL (ref 30.0–36.0)
MCV: 87.3 fL (ref 80.0–100.0)
Platelets: 210 10*3/uL (ref 150–400)
RBC: 5.27 MIL/uL — ABNORMAL HIGH (ref 3.87–5.11)
RDW: 14.3 % (ref 11.5–15.5)
WBC: 6.4 10*3/uL (ref 4.0–10.5)
nRBC: 0 % (ref 0.0–0.2)

## 2023-08-10 LAB — BRAIN NATRIURETIC PEPTIDE: B Natriuretic Peptide: 102.1 pg/mL — ABNORMAL HIGH (ref 0.0–100.0)

## 2023-08-10 MED ORDER — SPIRONOLACTONE 25 MG PO TABS: 25.0000 mg | ORAL_TABLET | Freq: Every day | ORAL | 3 refills | Status: DC

## 2023-08-10 MED ORDER — ATORVASTATIN CALCIUM 20 MG PO TABS: 20.0000 mg | ORAL_TABLET | Freq: Every day | ORAL | 3 refills | Status: DC

## 2023-08-10 NOTE — Progress Notes (Signed)
Daily Session Note  Patient Details  Name: Jennifer King MRN: [id number] Date of Birth: 10-23-1970 Referring Provider:   Flowsheet Row CARDIAC REHAB PHASE II ORIENTATION from 07/30/2023 in [facility] CARDIAC REHABILITATION  Referring Provider Pamella Pert MD       Encounter Date: 08/10/2023  Check In:  Session Check In - 08/10/23 1430       Check-In   Supervising physician immediately available to respond to emergencies See telemetry face sheet for immediately available ER MD    Location [facility]    Staff Present Ross Ludwig, BS, Exercise Physiologist;Brooke Elwyn Reach, Margarite Gouge, RN, BSN    Virtual Visit No    Medication changes reported     No    Fall or balance concerns reported    No    Warm-up and Cool-down Performed on first and last piece of equipment    Resistance Training Performed Yes    VAD Patient? No    PAD/SET Patient? No      Pain Assessment   Currently in Pain? No/denies    Multiple Pain Sites No             Capillary Blood Glucose: No results found for this or any previous visit (from the past 24 hours).    Social History   Tobacco Use  Smoking Status Former   Current packs/day: 0.50   Types: Cigarettes  Smokeless Tobacco Never    Goals Met:  Independence with exercise equipment Exercise tolerated well No report of concerns or symptoms today Strength training completed today  Goals Unmet:  Not Applicable  Comments: Pt able to follow exercise prescription today without complaint.  Will continue to monitor for progression.

## 2023-08-10 NOTE — Patient Instructions (Signed)
Medication Changes:  INCREASE SPIRONOLACTONE 25MG  ONCE DAILY   START: ATORVASTATIN 20MG  ONCE DAILY   Lab Work:  Labs done today, your results will be available in MyChart, we will contact you for abnormal readings.  THEN AGAIN IN 10 DAYS AS SCHEDULED   Testing/Procedures:  ONCE APPROVED WITH INSURANCE- SCHEDULING WILL REACH OUT TO GET THIS SCHEDULED FOR YOU   [facility] [address] [place], [place] [address] [phone] Please take advantage of the free valet parking available at the [facility] and [employer] (Entrance C).  Proceed to the [facility] Radiology Department (First Floor) for check-in.   Magnetic resonance imaging (MRI) is a painless test that produces images of the inside of the body without using Xrays.  During an MRI, strong magnets and radio waves work together in a Data processing manager to form detailed images.   MRI images may provide more details about a medical condition than X-rays, CT scans, and ultrasounds can provide.  You may be given earphones to listen for instructions.  You may eat a light breakfast and take medications as ordered with the exception of furosemide, hydrochlorothiazide, chlorthalidone or spironolactone (or any other fluid pill). If you are undergoing a stress MRI, please avoid stimulants for 12 hr prior to test. (I.e. Caffeine, nicotine, chocolate, or antihistamine medications)  An IV will be inserted into one of your veins. Contrast material will be injected into your IV. It will leave your body through your urine within a day. You may be told to drink plenty of fluids to help flush the contrast material out of your system.  You will be asked to remove all metal, including: Watch, jewelry, and other metal objects including hearing aids, hair pieces and dentures. Also wearable glucose monitoring systems (ie. Freestyle Libre and Omnipods) (Braces and fillings normally are not a problem.)   TEST WILL TAKE APPROXIMATELY 1  HOUR  PLEASE NOTIFY SCHEDULING AT LEAST 24 HOURS IN ADVANCE IF YOU ARE UNABLE TO KEEP YOUR APPOINTMENT. [phone]  For more information and frequently asked questions, please visit our website : [email or web address]  Please call the Cardiac Imaging Nurse Navigators with any questions/concerns. [phone] Office   Follow-Up in: AS SCHEDULED WITH DR. Elwyn Lade   At the [facility], you and your health needs are our priority. We have a designated team specialized in the treatment of Heart Failure. This Care Team includes your primary Heart Failure Specialized Cardiologist (physician), Advanced Practice Providers (APPs- Physician Assistants and Nurse Practitioners), and Pharmacist who all work together to provide you with the care you need, when you need it.   You may see any of the following providers on your designated Care Team at your next follow up:  Dr. Arvilla Meres Dr. Marca Ancona Dr. Dorthula Nettles Dr. Theresia Bough Tonye Becket, NP Robbie Lis, [place] [facility] [place], [place] Brynda Peon, NP [place] Lee, NP Karle Plumber, PharmD   Please be sure to bring in all your medications bottles to every appointment.   Need to Contact us:  If you have any questions or concerns before your next appointment please send [place] a message through [place] or call our office at [phone].    TO LEAVE A MESSAGE FOR THE NURSE SELECT OPTION 2, PLEASE LEAVE A MESSAGE INCLUDING: YOUR NAME DATE OF BIRTH CALL BACK NUMBER REASON FOR CALL**this is important as we prioritize the call backs  YOU WILL RECEIVE A CALL BACK THE SAME DAY AS LONG AS YOU CALL BEFORE 4:00 P

## 2023-08-11 ENCOUNTER — Encounter (HOSPITAL_COMMUNITY)
Admission: RE | Admit: 2023-08-11 | Discharge: 2023-08-11 | Disposition: A | Discharge status: Home / Self Care | Payer: 59 | Source: Ambulatory Visit | Attending: Cardiovascular Disease | Admitting: Cardiovascular Disease

## 2023-08-11 DIAGNOSIS — I214 Non-ST elevation (NSTEMI) myocardial infarction: Secondary | ICD-10-CM | POA: Diagnosis not present

## 2023-08-11 NOTE — Progress Notes (Signed)
Daily Session Note  Patient Details  Name: Jennifer King MRN: [id number] Date of Birth: June 17, 1971 Referring Provider:   Flowsheet Row CARDIAC REHAB PHASE II ORIENTATION from 07/30/2023 in [facility] CARDIAC REHABILITATION  Referring Provider Pamella Pert MD       Encounter Date: 08/11/2023  Check In:  Session Check In - 08/11/23 1330       Check-In   Supervising physician immediately available to respond to emergencies See telemetry face sheet for immediately available MD    Location [facility]    Staff Present Ross Ludwig, BS, Exercise Physiologist;Brooke Elwyn Reach, RN    Virtual Visit No    Medication changes reported     No    Fall or balance concerns reported    No    Tobacco Cessation No Change    Warm-up and Cool-down Performed on first and last piece of equipment    Resistance Training Performed Yes    VAD Patient? No    PAD/SET Patient? No      Pain Assessment   Currently in Pain? No/denies    Multiple Pain Sites No             Capillary Blood Glucose: No results found for this or any previous visit (from the past 24 hours).    Social History   Tobacco Use  Smoking Status Former   Current packs/day: 0.50   Types: Cigarettes  Smokeless Tobacco Never    Goals Met:  Independence with exercise equipment Using PLB without cueing & demonstrates good technique Exercise tolerated well No report of concerns or symptoms today Strength training completed today  Goals Unmet:  Not Applicable  Comments: Pt able to follow exercise prescription today without complaint.  Will continue to monitor for progression.

## 2023-08-12 ENCOUNTER — Encounter (HOSPITAL_COMMUNITY)
Admission: RE | Admit: 2023-08-12 | Discharge: 2023-08-12 | Disposition: A | Discharge status: Home / Self Care | Payer: 59 | Source: Ambulatory Visit | Attending: Cardiology

## 2023-08-12 DIAGNOSIS — I214 Non-ST elevation (NSTEMI) myocardial infarction: Secondary | ICD-10-CM

## 2023-08-12 NOTE — Progress Notes (Signed)
Daily Session Note  Patient Details  Name: Jennifer King MRN: [id number] Date of Birth: 1971/05/18 Referring Provider:   Flowsheet Row CARDIAC REHAB PHASE II ORIENTATION from 07/30/2023 in [facility] CARDIAC REHABILITATION  Referring Provider Pamella Pert MD       Encounter Date: 08/12/2023  Check In:  Session Check In - 08/12/23 0915       Check-In   Supervising physician immediately available to respond to emergencies See telemetry face sheet for immediately available MD    Location [facility]    Staff Present Ross Ludwig, BS, Exercise Physiologist;Brooke Elwyn Reach, RN    Virtual Visit No    Medication changes reported     No    Fall or balance concerns reported    No    Tobacco Cessation No Change    Warm-up and Cool-down Performed on first and last piece of equipment    Resistance Training Performed Yes    VAD Patient? No    PAD/SET Patient? No      Pain Assessment   Currently in Pain? No/denies    Multiple Pain Sites No             Capillary Blood Glucose: No results found for this or any previous visit (from the past 24 hours).    Social History   Tobacco Use  Smoking Status Former   Current packs/day: 0.50   Types: Cigarettes  Smokeless Tobacco Never    Goals Met:  Independence with exercise equipment Exercise tolerated well No report of concerns or symptoms today Strength training completed today  Goals Unmet:  Not Applicable  Comments: Pt able to follow exercise prescription today without complaint.  Will continue to monitor for progression.

## 2023-08-13 ENCOUNTER — Encounter (HOSPITAL_COMMUNITY)
Admission: RE | Admit: 2023-08-13 | Discharge: 2023-08-13 | Disposition: A | Discharge status: Home / Self Care | Payer: 59 | Source: Ambulatory Visit | Attending: Cardiovascular Disease | Admitting: Cardiovascular Disease

## 2023-08-13 DIAGNOSIS — I214 Non-ST elevation (NSTEMI) myocardial infarction: Secondary | ICD-10-CM | POA: Diagnosis not present

## 2023-08-13 NOTE — Progress Notes (Signed)
Daily Session Note  Patient Details  Name: Wafa Egert MRN: [id number] Date of Birth: 11-13-1970 Referring Provider:   Flowsheet Row CARDIAC REHAB PHASE II ORIENTATION from 07/30/2023 in [facility] CARDIAC REHABILITATION  Referring Provider Pamella Pert MD       Encounter Date: 08/13/2023  Check In:  Session Check In - 08/13/23 1030       Check-In   Supervising physician immediately available to respond to emergencies See telemetry face sheet for immediately available MD    Location [facility]    Staff Present Ross Ludwig, BS, Exercise Physiologist;Brooke Elwyn Reach, RN    Virtual Visit No    Medication changes reported     No    Fall or balance concerns reported    No    Tobacco Cessation No Change    Warm-up and Cool-down Performed on first and last piece of equipment    Resistance Training Performed Yes    VAD Patient? No    PAD/SET Patient? No      Pain Assessment   Currently in Pain? No/denies    Multiple Pain Sites No             Capillary Blood Glucose: No results found for this or any previous visit (from the past 24 hours).    Social History   Tobacco Use  Smoking Status Former   Current packs/day: 0.50   Types: Cigarettes  Smokeless Tobacco Never    Goals Met:  Independence with exercise equipment Exercise tolerated well No report of concerns or symptoms today Strength training completed today  Goals Unmet:  Not Applicable  Comments: Pt able to follow exercise prescription today without complaint.  Will continue to monitor for progression.

## 2023-08-14 ENCOUNTER — Encounter (HOSPITAL_COMMUNITY)
Admission: RE | Admit: 2023-08-14 | Discharge: 2023-08-14 | Disposition: A | Discharge status: Home / Self Care | Payer: 59 | Source: Ambulatory Visit | Attending: Cardiology | Admitting: Cardiology

## 2023-08-14 DIAGNOSIS — I214 Non-ST elevation (NSTEMI) myocardial infarction: Secondary | ICD-10-CM | POA: Diagnosis not present

## 2023-08-14 NOTE — Progress Notes (Signed)
Daily Session Note  Patient Details  Name: Jennifer King MRN: [id number] Date of Birth: 1971/07/21 Referring Provider:   Flowsheet Row CARDIAC REHAB PHASE II ORIENTATION from 07/30/2023 in [facility] CARDIAC REHABILITATION  Referring Provider Pamella Pert MD       Encounter Date: 08/14/2023  Check In:  Session Check In - 08/14/23 1415       Check-In   Supervising physician immediately available to respond to emergencies See telemetry face sheet for immediately available ER MD    Location [facility]    Staff Present Ross Ludwig, BS, Exercise Physiologist;Brooke Elwyn Reach, Margarite Gouge, RN, BSN    Virtual Visit No    Medication changes reported     No    Fall or balance concerns reported    No    Warm-up and Cool-down Performed on first and last piece of equipment    Resistance Training Performed Yes    VAD Patient? No    PAD/SET Patient? No      Pain Assessment   Currently in Pain? No/denies    Multiple Pain Sites No             Capillary Blood Glucose: No results found for this or any previous visit (from the past 24 hours).    Social History   Tobacco Use  Smoking Status Former   Current packs/day: 0.50   Types: Cigarettes  Smokeless Tobacco Never    Goals Met:  Independence with exercise equipment Exercise tolerated well No report of concerns or symptoms today Strength training completed today  Goals Unmet:  Not Applicable  Comments: Pt able to follow exercise prescription today without complaint.  Will continue to monitor for progression.

## 2023-08-17 ENCOUNTER — Encounter (HOSPITAL_COMMUNITY)
Admission: RE | Admit: 2023-08-17 | Discharge: 2023-08-17 | Disposition: A | Discharge status: Home / Self Care | Payer: 59 | Source: Ambulatory Visit | Attending: Cardiology

## 2023-08-17 DIAGNOSIS — I214 Non-ST elevation (NSTEMI) myocardial infarction: Secondary | ICD-10-CM | POA: Diagnosis not present

## 2023-08-17 NOTE — Progress Notes (Signed)
Daily Session Note  Patient Details  Name: Jennifer King MRN: [id number] Date of Birth: 07-Sep-1970 Referring Provider:   Flowsheet Row CARDIAC REHAB PHASE II ORIENTATION from 07/30/2023 in [facility] CARDIAC REHABILITATION  Referring Provider Pamella Pert MD       Encounter Date: 08/17/2023  Check In:  Session Check In - 08/17/23 1430       Check-In   Supervising physician immediately available to respond to emergencies See telemetry face sheet for immediately available MD    Location [facility]    Staff Present Ross Ludwig, BS, Exercise Physiologist;Brooke Elwyn Reach, RN;Phyllis Billingsley, RN    Virtual Visit No    Medication changes reported     No    Fall or balance concerns reported    No    Tobacco Cessation No Change    Warm-up and Cool-down Performed on first and last piece of equipment    Resistance Training Performed Yes    VAD Patient? No    PAD/SET Patient? No      Pain Assessment   Currently in Pain? No/denies    Multiple Pain Sites No             Capillary Blood Glucose: No results found for this or any previous visit (from the past 24 hours).    Social History   Tobacco Use  Smoking Status Former   Current packs/day: 0.50   Types: Cigarettes  Smokeless Tobacco Never    Goals Met:  Independence with exercise equipment Exercise tolerated well No report of concerns or symptoms today Strength training completed today  Goals Unmet:  Not Applicable  Comments: Pt able to follow exercise prescription today without complaint.  Will continue to monitor for progression.

## 2023-08-18 ENCOUNTER — Encounter (HOSPITAL_COMMUNITY)
Admission: RE | Admit: 2023-08-18 | Discharge: 2023-08-18 | Disposition: A | Discharge status: Home / Self Care | Payer: 59 | Source: Ambulatory Visit | Attending: Cardiovascular Disease | Admitting: Cardiovascular Disease

## 2023-08-18 DIAGNOSIS — I214 Non-ST elevation (NSTEMI) myocardial infarction: Secondary | ICD-10-CM | POA: Diagnosis not present

## 2023-08-18 NOTE — Progress Notes (Signed)
Daily Session Note  Patient Details  Name: Jennifer King MRN: [id number] Date of Birth: 1971-04-13 Referring Provider:   Flowsheet Row CARDIAC REHAB PHASE II ORIENTATION from 07/30/2023 in [facility] CARDIAC REHABILITATION  Referring Provider Pamella Pert MD       Encounter Date: 08/18/2023  Check In:  Session Check In - 08/18/23 1109       Check-In   Supervising physician immediately available to respond to emergencies See telemetry face sheet for immediately available MD    Location [facility]    Staff Present Terrance Mass, RN;Jessica Juanetta Gosling, MA, RCEP, CCRP, CCET;Heather Fredric Mare, BS, Exercise Physiologist;Laureen Manson Passey, BS, RRT, CPFT;Eragon Hammond, RN    Virtual Visit No    Medication changes reported     No    Fall or balance concerns reported    No    Warm-up and Cool-down Performed on first and last piece of equipment    Resistance Training Performed Yes    VAD Patient? No    PAD/SET Patient? No      Pain Assessment   Currently in Pain? No/denies    Multiple Pain Sites No             Capillary Blood Glucose: No results found for this or any previous visit (from the past 24 hours).    Social History   Tobacco Use  Smoking Status Former   Current packs/day: 0.50   Types: Cigarettes  Smokeless Tobacco Never    Goals Met:  Independence with exercise equipment Exercise tolerated well No report of concerns or symptoms today Strength training completed today  Goals Unmet:  Not Applicable  Comments: Pt able to follow exercise prescription today without complaint.  Will continue to monitor for progression.

## 2023-08-19 ENCOUNTER — Encounter (HOSPITAL_COMMUNITY): Payer: Self-pay | Admitting: *Deleted

## 2023-08-19 ENCOUNTER — Encounter (HOSPITAL_COMMUNITY)
Admission: RE | Admit: 2023-08-19 | Discharge: 2023-08-19 | Disposition: A | Discharge status: Home / Self Care | Payer: 59 | Source: Ambulatory Visit | Attending: Cardiology | Admitting: Cardiology

## 2023-08-19 DIAGNOSIS — I214 Non-ST elevation (NSTEMI) myocardial infarction: Secondary | ICD-10-CM

## 2023-08-19 NOTE — Progress Notes (Signed)
Cardiac Individual Treatment Plan  Patient Details  Name: Jennifer King MRN: [id number] Date of Birth: 07-21-1971 Referring Provider:   Flowsheet Row CARDIAC REHAB PHASE II ORIENTATION from 07/30/2023 in [place] CARDIAC RE[facility]ABILITATION  Referring Provider Pamella Pert MD       Initial Encounter Date:  Flowsheet Row CARDIAC REHAB PHASE II ORIENTATION from 07/30/2023 in [place] CARDIAC REHABILITATION  Date 07/30/23       Visit Diagnosis: NSTEMI (non-ST elevated myocardial infarction) [facility])  Patient's Home Medications on Admission:  Current Outpatient Medications:    aspirin EC 81 MG tablet, Take 1 tablet (81 mg total) by mouth daily. Swallow whole., Disp: 30 tablet, Rfl: 11   atorvastatin (LIPITOR) 20 MG tablet, Take 1 tablet (20 mg total) by mouth daily., Disp: 90 tablet, Rfl: 3   diphenhydrAMINE (BENADRYL) 25 mg capsule, Take 25 mg by mouth as needed., Disp: , Rfl:    metoprolol succinate (TOPROL-XL) 25 MG 24 hr tablet, TAKE 1 TABLET (25 MG TOTAL) BY MOUTH DAILY., Disp: 90 tablet, Rfl: 3   MULTIPLE VITAMIN PO, Take 1 tablet by mouth daily. 1 Gummy daily, Disp: , Rfl:    sacubitril-valsartan (ENTRESTO) 24-26 MG, Take 1 tablet by mouth 2 (two) times daily., Disp: 60 tablet, Rfl: 3   spironolactone (ALDACTONE) 25 MG tablet, Take 1 tablet (25 mg total) by mouth daily., Disp: 30 tablet, Rfl: 3  Past Medical History: No past medical history on file.  Tobacco Use: Social History   Tobacco Use  Smoking Status Former   Current packs/day: 0.50   Types: Cigarettes  Smokeless Tobacco Never    Labs: Review Flowsheet       Latest Ref Rng & Units 06/27/2023 06/29/2023 07/01/2023 07/13/2023  Labs for ITP Cardiac and Pulmonary Rehab  Cholestrol 0 - 200 mg/dL - 829  562  -  LDL (calc) 0 - 99 mg/dL - 64  130  -  HDL-C >86 mg/dL - 20  33  -  Trlycerides <150 mg/dL - 85  98  -  Hemoglobin A1c 4.8 - 5.6 % - - - 5.2   PH, Arterial 7.35 - 7.45 7.35  - - -  PCO2 arterial 32 - 48 mmHg  <18  - - -  Bicarbonate 20.0 - 28.0 mmol/L 8.6  28.7  25.6  25.9  - -  TCO2 22 - 32 mmol/L - 30  27  27   - -  Acid-base deficit 0.0 - 2.0 mmol/L 14.4  - - -  O2 Saturation % 100  59  68  98  - -    Details       Multiple values from one day are sorted in reverse-chronological order         Capillary Blood Glucose: No results found for: "GLUCAP"   Exercise Target Goals: Exercise Program Goal: Individual exercise prescription set using results from initial 6 min walk test and THRR while considering  patient's activity barriers and safety.   Exercise Prescription Goal: Starting with aerobic activity 30 plus minutes a day, 3 days per week for initial exercise prescription. Provide home exercise prescription and guidelines that participant acknowledges understanding prior to discharge.  Activity Barriers & Risk Stratification:  Activity Barriers & Cardiac Risk Stratification - 07/30/23 1400       Activity Barriers & Cardiac Risk Stratification   Activity Barriers None    Cardiac Risk Stratification Moderate             6 Minute Walk:  6 Minute  Walk     Row Name 07/30/23 1512         6 Minute Walk   Distance 990 feet     Walk Time 6 minutes     # of Rest Breaks 0     MPH 1.88     METS 3.45     RPE 11     Perceived Dyspnea  0     VO2 Peak 12.07     Symptoms No     Resting HR 95 bpm     Resting BP 96/64     Resting Oxygen Saturation  97 %     Exercise Oxygen Saturation  during 6 min walk 98 %     Max Ex. HR 102 bpm     Max Ex. BP 112/60     2 Minute Post BP 100/60              Oxygen Initial Assessment:   Oxygen Re-Evaluation:   Oxygen Discharge (Final Oxygen Re-Evaluation):   Initial Exercise Prescription:  Initial Exercise Prescription - 07/30/23 1500       Date of Initial Exercise RX and Referring Provider   Date 07/30/23    Referring Provider Pamella Pert MD      Oxygen   Maintain Oxygen Saturation 88% or higher      Treadmill    MPH 1.5    Grade 0    Minutes 15    METs 2.3      REL-XR   Level 2    Speed 50    Minutes 15    METs 2      Prescription Details   Frequency (times per week) 3    Duration Progress to 30 minutes of continuous aerobic without signs/symptoms of physical distress      Intensity   THRR 40-80% of Max Heartrate 125-155    Ratings of Perceived Exertion 11-13    Perceived Dyspnea 0-4      Resistance Training   Training Prescription Yes    Weight 4    Reps 10-15             Perform Capillary Blood Glucose checks as needed.  Exercise Prescription Changes:   Exercise Prescription Changes     Row Name 07/30/23 1500 08/03/23 0900 08/03/23 1300         Response to Exercise   Blood Pressure (Admit) 96/64 -- 120/62     Blood Pressure (Exercise) 112/60 -- 128/62     Blood Pressure (Exit) 100/60 -- 116/62     Heart Rate (Admit) 95 bpm -- 77 bpm     Heart Rate (Exercise) 102 bpm -- 123 bpm     Heart Rate (Exit) 98 bpm -- 105 bpm     Oxygen Saturation (Admit) 97 % -- --     Oxygen Saturation (Exercise) 98 % -- --     Oxygen Saturation (Exit) 97 % -- --     Rating of Perceived Exertion (Exercise) 11 -- 12     Perceived Dyspnea (Exercise) 0 -- --     Duration -- -- Continue with 30 min of aerobic exercise without signs/symptoms of physical distress.     Intensity -- -- THRR unchanged       Progression   Progression -- -- Continue to progress workloads to maintain intensity without signs/symptoms of physical distress.       Resistance Training   Training Prescription -- -- Yes     Weight -- --  4     Reps -- -- 10-15       Treadmill   MPH -- -- 1.8     Grade -- -- 0.5     Minutes -- -- 15     METs -- -- 2.5       REL-XR   Level -- -- 2     Speed -- -- 30     Minutes -- -- 15     METs -- -- 1.7       Home Exercise Plan   Plans to continue exercise at -- Home (comment) Home (comment)     Frequency -- Add 2 additional days to program exercise sessions. Add 2  additional days to program exercise sessions.     Initial Home Exercises Provided -- 08/03/23 --              Exercise Comments:   Exercise Comments     Row Name 07/31/23 [phone]           Exercise Comments First full day of exercise!  Patient was oriented to gym and equipment including functions, settings, policies, and procedures.  Patient's individual exercise prescription and treatment plan were reviewed.  All starting workloads were established based on the results of the 6 minute walk test done at initial orientation visit.  The plan for exercise progression was also introduced and progression will be customized based on patient's performance and goals.                Exercise Goals and Review:   Exercise Goals     Row Name 07/30/23 1516             Exercise Goals   Increase Physical Activity Yes       Intervention Provide advice, education, support and counseling about physical activity/exercise needs.;Develop an individualized exercise prescription for aerobic and resistive training based on initial evaluation findings, risk stratification, comorbidities and participant's personal goals.       Expected Outcomes Short Term: Attend rehab on a regular basis to increase amount of physical activity.;Long Term: Add in home exercise to make exercise part of routine and to increase amount of physical activity.;Long Term: Exercising regularly at least 3-5 days a week.       Increase Strength and Stamina Yes       Intervention Provide advice, education, support and counseling about physical activity/exercise needs.;Develop an individualized exercise prescription for aerobic and resistive training based on initial evaluation findings, risk stratification, comorbidities and participant's personal goals.       Expected Outcomes Short Term: Increase workloads from initial exercise prescription for resistance, speed, and METs.;Long Term: Improve cardiorespiratory fitness, muscular  endurance and strength as measured by increased METs and functional capacity ( );Short Term: Perform resistance training exercises routinely during rehab and add in resistance training at home       Able to understand and use rate of perceived exertion (RPE) scale Yes       Intervention Provide education and explanation on how to use RPE scale       Expected Outcomes Short Term: Able to use RPE daily in rehab to express subjective intensity level;Long Term:  Able to use RPE to guide intensity level when exercising independently       Able to understand and use Dyspnea scale Yes       Intervention Provide education and explanation on how to use Dyspnea scale       Expected Outcomes Short Term: Able  to use Dyspnea scale daily in rehab to express subjective sense of shortness of breath during exertion;Long Term: Able to use Dyspnea scale to guide intensity level when exercising independently       Knowledge and understanding of Target Heart Rate Range (THRR) Yes       Intervention Provide education and explanation of THRR including how the numbers were predicted and where they are located for reference       Expected Outcomes Short Term: Able to state/look up THRR;Short Term: Able to use daily as guideline for intensity in rehab;Long Term: Able to use THRR to govern intensity when exercising independently       Able to check pulse independently Yes       Intervention Provide education and demonstration on how to check pulse in carotid and radial arteries.;Review the importance of being able to check your own pulse for safety during independent exercise       Expected Outcomes Short Term: Able to explain why pulse checking is important during independent exercise;Long Term: Able to check pulse independently and accurately       Understanding of Exercise Prescription Yes       Intervention Provide education, explanation, and written materials on patient's individual exercise prescription       Expected  Outcomes Short Term: Able to explain program exercise prescription;Long Term: Able to explain home exercise prescription to exercise independently                Exercise Goals Re-Evaluation :  Exercise Goals Re-Evaluation     Row Name 07/31/23 9562 08/03/23 0950 08/07/23 1408         Exercise Goal Re-Evaluation   Exercise Goals Review Able to understand and use rate of perceived exertion (RPE) scale;Knowledge and understanding of Target Heart Rate Range (THRR);Understanding of Exercise Prescription;Able to understand and use Dyspnea scale Increase Physical Activity;Increase Strength and Stamina;Understanding of Exercise Prescription Increase Physical Activity;Increase Strength and Stamina;Understanding of Exercise Prescription     Comments Reviewed RPE and dyspnea scale, THR and program prescription with pt today.  Pt voiced understanding and was given a copy of goals to take home. Jennifer King is doing good in rehab. SHe is on her second visit and hsa pushed herself. She does have a treadmill at home and we went over exercising at home. Jennifer King is doing well in rehab and is tolerating exercise well. This has been her first full week and has been pushing herself. She has increased her speed on the treadmill with a RPE of 13. Will continue to montior and progress as able.     Expected Outcomes Short: Use RPE daily to regulate intensity.  Long: Follow program prescription in THR. Short: start exercising at home   long term: Continue to attend rehab Continue to attend rehab               Discharge Exercise Prescription (Final Exercise Prescription Changes):  Exercise Prescription Changes - 08/03/23 1300       Response to Exercise   Blood Pressure (Admit) 120/62    Blood Pressure (Exercise) 128/62    Blood Pressure (Exit) 116/62    Heart Rate (Admit) 77 bpm    Heart Rate (Exercise) 123 bpm    Heart Rate (Exit) 105 bpm    Rating of Perceived Exertion (Exercise) 12    Duration Continue with  30 min of aerobic exercise without signs/symptoms of physical distress.    Intensity THRR unchanged      Progression  Progression Continue to progress workloads to maintain intensity without signs/symptoms of physical distress.      Resistance Training   Training Prescription Yes    Weight 4    Reps 10-15      Treadmill   MPH 1.8    Grade 0.5    Minutes 15    METs 2.5      REL-XR   Level 2    Speed 30    Minutes 15    METs 1.7      Home Exercise Plan   Plans to continue exercise at Home (comment)    Frequency Add 2 additional days to program exercise sessions.             Nutrition:  Target Goals: Understanding of nutrition guidelines, daily intake of sodium 1500mg , cholesterol 200mg , calories 30% from fat and 7% or less from saturated fats, daily to have 5 or more servings of fruits and vegetables.  Biometrics:  Pre Biometrics - 07/30/23 1517       Pre Biometrics   Height 5\' 3"  (1.6 m)    Weight 133 lb 6.1 oz (60.5 kg)    Waist Circumference 35 inches    Hip Circumference 36.5 inches    Waist to Hip Ratio 0.96 %    BMI (Calculated) 23.63    Grip Strength 20.6 kg    Single Leg Stand 15.64 seconds              Nutrition Therapy Plan and Nutrition Goals:   Nutrition Assessments:  MEDIFICTS Score Key: >=70 Need to make dietary changes  40-70 Heart Healthy Diet <= 40 Therapeutic Level Cholesterol Diet  Flowsheet Row CARDIAC REHAB PHASE II ORIENTATION from 07/30/2023 in [facility] CARDIAC REH[facility]  Picture Your Plate Total Score on Admission 27      Picture Your Plate Scores: <81 Unhealthy dietary pattern with much room for improvement. 41-50 Dietary pattern unlikely to meet recommendations for good health and room for improvement. 51-60 More healthful dietary pattern, with some room for improvement.  >60 Healthy dietary pattern, although there may be some specific behaviors that could be improved.    Nutrition Goals Re-Evaluation:   Nutrition Goals Re-Evaluation     Row Name 08/03/23 0933             Goals   Nutrition Goal healthy eating       Comment Jennifer King is doing well in rehab, she has just started the program. She is trying to get into picking healthy chosoice. She is cooking more chicken now by [employer] or baking. She does drink diet coke and water mostly and then drinks tea every now and then. She was drinking more mt dew but has cut back. She does not eat many desserts, she will eat a little bit every now and then.       Expected Outcome Short: focus on cutting back on sodas and drink mostly water    Long term: continue to pick healthy options for meals                Nutrition Goals Discharge (Final Nutrition Goals Re-Evaluation):  Nutrition Goals Re-Evaluation - 08/03/23 0933       Goals   Nutrition Goal healthy eating    Comment Jennifer King is doing well in rehab, she has just started the program. She is trying to get into picking healthy chosoice. She is cooking more chicken now by [employer] or baking. She does drink diet coke and water mostly  and then drinks tea every now and then. She was drinking more mt dew but has cut back. She does not eat many desserts, she will eat a little bit every now and then.    Expected Outcome Short: focus on cutting back on sodas and drink mostly water    Long term: continue to pick healthy options for meals             Psychosocial: Target Goals: Acknowledge presence or absence of significant depression and/or stress, maximize coping skills, provide positive support system. Participant is able to verbalize types and ability to use techniques and skills needed for reducing stress and depression.  Initial Review & Psychosocial Screening:  Initial Psych Review & Screening - 07/30/23 1511       Initial Review   Current issues with Current Stress Concerns    Source of Stress Concerns Family    Comments Patient's in-laws recently moved closer and this has been a stressor  for patient.      Family Dynamics   Good Support System? Yes    Comments Patient's husband, mother, and a close friend are her support.      Barriers   Psychosocial barriers to participate in program There are no identifiable barriers or psychosocial needs.      Screening Interventions   Interventions Encouraged to exercise;To provide support and resources with identified psychosocial needs;Provide feedback about the scores to participant    Expected Outcomes Long Term goal: The participant improves quality of Life and PHQ9 Scores as seen by post scores and/or verbalization of changes;Short Term goal: Identification and review with participant of any Quality of Life or Depression concerns found by scoring the questionnaire.;Long Term Goal: Stressors or current issues are controlled or eliminated.;Short Term goal: Utilizing psychosocial counselor, staff and physician to assist with identification of specific Stressors or current issues interfering with healing process. Setting desired goal for each stressor or current issue identified.             Quality of Life Scores:  Quality of Life - 08/07/23 1319       Quality of Life   Select Quality of Life      Quality of Life Scores   Health/Function Pre 25.61 %    Socioeconomic Pre 23.79 %    Psych/Spiritual Pre 24.64 %    Family Pre 27.3 %    GLOBAL Pre 25.27 %            Scores of 19 and below usually indicate a poorer quality of life in these areas.  A difference of  2-3 points is a clinically meaningful difference.  A difference of 2-3 points in the total score of the Quality of Life Index has been associated with significant improvement in overall quality of life, self-image, physical symptoms, and general health in studies assessing change in quality of life.  PHQ-9: Review Flowsheet       07/30/2023  Depression screen PHQ 2/9  Decreased Interest 0  Down, Depressed, Hopeless 0  PHQ - 2 Score 0  Altered sleeping 0   Tired, decreased energy 0  Change in appetite 0  Feeling bad or failure about yourself  0  Trouble concentrating 0  Moving slowly or fidgety/restless 0  Suicidal thoughts 0  PHQ-9 Score 0   Interpretation of Total Score  Total Score Depression Severity:  1-4 = Minimal depression, 5-9 = Mild depression, 10-14 = Moderate depression, 15-19 = Moderately severe depression, 20-27 = Severe depression  Psychosocial Evaluation and Intervention:  Psychosocial Evaluation - 07/30/23 1516       Psychosocial Evaluation & Interventions   Interventions Stress management education;Relaxation education;Encouraged to exercise with the program and follow exercise prescription    Comments Patient was referred to CR with NSTEMI. Her PHQ-9 score was 0. She lives with her husband and 2 school age children. She denies any depression or anxiety. She says her in-laws recently moved closer to them from out of town and this has been a stressor for her and her husband and her MI and recent onset of HF has also been a stressor. She recently quit smoking after her MI without any aides. Her husband still smokes but he does not smoke around her. She feels she is managing this well. She says she sleeps well. 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 She plans to return to her work soon. She is also very involved with kids school activities. Her goals for the program are to get stronger and healthier overall and to learn how to eat healthier. She has no barriers identified to complete the program. She is ready to start the program.    Expected Outcomes Short Term: start the program and attend consistently. Long Term: Complete the program meeting her personal goals.    Continue Psychosocial Services  Follow up required by staff             Psychosocial Re-Evaluation:  Psychosocial Re-Evaluation     Row Name 08/03/23 [phone]             Psychosocial Re-Evaluation    Current issues with None Identified       Comments Jennifer King stated that she does not have any sleep issues and is sleeping through the night. She does have everyday normal stress with house work and her two teenage kids.       Expected Outcomes Short: find an outlet to be able to let daily stress out  long term: continue montior daily stress and have no sleep concerns       Interventions Encouraged to attend Cardiac Rehabilitation for the exercise       Continue Psychosocial Services  Follow up required by staff                Psychosocial Discharge (Final Psychosocial Re-Evaluation):  Psychosocial Re-Evaluation - 08/03/23 0942       Psychosocial Re-Evaluation   Current issues with None Identified    Comments Jennifer King stated that she does not have any sleep issues and is sleeping through the night. She does have everyday normal stress with house work and her two teenage kids.    Expected Outcomes Short: find an outlet to be able to let daily stress out  long term: continue montior daily stress and have no sleep concerns    Interventions Encouraged to attend Cardiac Rehabilitation for the exercise    Continue Psychosocial Services  Follow up required by staff             Vocational Rehabilitation: Provide vocational rehab assistance to qualifying candidates.   Vocational Rehab Evaluation & Intervention:  Vocational Rehab - 07/30/23 1513       Initial Vocational Rehab Evaluation & Intervention   Assessment shows need for Vocational Rehabilitation No      Vocational Rehab Re-Evaulation   Comments Patient plans to return to substitute teaching.             Education:  Education Goals: Education classes will be provided on a weekly basis, covering required topics. Participant will state understanding/return demonstration of topics presented.  Learning Barriers/Preferences:  Learning Barriers/Preferences - 07/30/23 1414       Learning Barriers/Preferences   Learning  Barriers Sight    Learning Preferences Skilled Demonstration;Audio             Education Topics: Hypertension, Hypertension Reduction -Define heart disease and high blood pressure. Discus how high blood pressure affects the body and ways to reduce high blood pressure. Flowsheet Row CARDIAC REHAB PHASE II EXERCISE from 08/13/2023 in [place] CARDIAC REHABILITATION  Date 08/05/23  Educator jh  Instruction Review Code 1- Verbalizes Understanding       Exercise and Your Heart -Discuss why it is important to exercise, the FITT principles of exercise, normal and abnormal responses to exercise, and how to exercise safely.   Angina -Discuss definition of angina, causes of angina, treatment of angina, and how to decrease risk of having angina.   Cardiac Medications -Review what the following cardiac medications are used for, how they affect the body, and side effects that may occur when taking the medications.  Medications include Aspirin, Beta blockers, calcium channel blockers, ACE Inhibitors, angiotensin receptor blockers, diuretics, digoxin, and antihyperlipidemics. Flowsheet Row CARDIAC REHAB PHASE II EXERCISE from 08/13/2023 in [place] CARDIAC REHABILITATION  Date 08/12/23  Educator hb  Instruction Review Code 1- Verbalizes Understanding       Congestive Heart Failure -Discuss the definition of CHF, how to live with CHF, the signs and symptoms of CHF, and how keep track of weight and sodium intake.   Heart Disease and Intimacy -Discus the effect sexual activity has on the heart, how changes occur during intimacy as we age, and safety during sexual activity.   Smoking Cessation / COPD -Discuss different methods to quit smoking, the health benefits of quitting smoking, and the definition of COPD.   Nutrition I: Fats -Discuss the types of cholesterol, what cholesterol does to the heart, and how cholesterol levels can be controlled.   Nutrition II: Labels -Discuss  the different components of food labels and how to read food label   Heart Parts/Heart Disease and PAD -Discuss the anatomy of the heart, the pathway of blood circulation through the heart, and these are affected by heart disease.   Stress I: Signs and Symptoms -Discuss the causes of stress, how stress may lead to anxiety and depression, and ways to limit stress.   Stress II: Relaxation -Discuss different types of relaxation techniques to limit stress.   Warning Signs of Stroke / TIA -Discuss definition of a stroke, what the signs and symptoms are of a stroke, and how to identify when someone is having stroke.   Knowledge Questionnaire Score:  Knowledge Questionnaire Score - 07/30/23 1504       Knowledge Questionnaire Score   Pre Score 22/24             Core Components/Risk Factors/Patient Goals at Admission:  Personal Goals and Risk Factors at Admission - 07/30/23 1503       Core Components/Risk Factors/Patient Goals on Admission    Weight Management Weight Maintenance    Heart Failure Yes    Intervention Provide a combined exercise and nutrition program that is supplemented with education, support and counseling about heart failure. Directed toward relieving symptoms such as shortness of breath, decreased exercise tolerance, and extremity edema.    Expected Outcomes Improve functional capacity of life;Short term: Attendance in  program 2-3 days a week with increased exercise capacity. Reported lower sodium intake. Reported increased fruit and vegetable intake. Reports medication compliance.;Short term: Daily weights obtained and reported for increase. Utilizing diuretic protocols set by physician.;Long term: Adoption of self-care skills and reduction of barriers for early signs and symptoms recognition and intervention leading to self-care maintenance.    Stress Yes    Intervention Offer individual and/or small group education and counseling on adjustment to heart disease,  stress management and health-related lifestyle change. Teach and support self-help strategies.;Refer participants experiencing significant psychosocial distress to appropriate mental health specialists for further evaluation and treatment. When possible, include family members and significant others in education/counseling sessions.    Expected Outcomes Short Term: Participant demonstrates changes in health-related behavior, relaxation and other stress management skills, ability to obtain effective social support, and compliance with psychotropic medications if prescribed.;Long Term: Emotional wellbeing is indicated by absence of clinically significant psychosocial distress or social isolation.             Core Components/Risk Factors/Patient Goals Review:   Goals and Risk Factor Review     Row Name 08/03/23 0945             Core Components/Risk Factors/Patient Goals Review   Personal Goals Review Weight Management/Obesity       Review Jennifer King has just started with the program and is on her second visit today. She does not have any goals other then inreaseing her stamina and a good health. She lost weight due to her event and is good with where she it at.       Expected Outcomes continue to have no risk factorss                Core Components/Risk Factors/Patient Goals at Discharge (Final Review):   Goals and Risk Factor Review - 08/03/23 0945       Core Components/Risk Factors/Patient Goals Review   Personal Goals Review Weight Management/Obesity    Review Jennifer King has just started with the program and is on her second visit today. She does not have any goals other then inreaseing her stamina and a good health. She lost weight due to her event and is good with where she it at.    Expected Outcomes continue to have no risk factorss             ITP Comments:  ITP Comments     Row Name 07/31/23 [phone] 08/03/23 0954 08/19/23 0832       ITP Comments First full day of exercise!   Patient was oriented to gym and equipment including functions, settings, policies, and procedures.  Patient's individual exercise prescription and treatment plan were reviewed.  All starting workloads were established based on the results of the 6 minute walk test done at initial orientation visit.  The plan for exercise progression was also introduced and progression will be customized based on patient's performance and goals. Went over home exercise since patient has a treadmill at home to use. 30 day review completed. ITP sent to Dr. Dina Rich, Medical Director of Cardiac Rehab. Continue with ITP unless changes are made by physician.    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              Comments: 30 day review

## 2023-08-19 NOTE — Progress Notes (Signed)
Daily Session Note  Patient Details  Name: Jennifer King MRN: [id number] Date of Birth: 03/28/1971 Referring Provider:   Flowsheet Row CARDIAC REHAB PHASE II ORIENTATION from 07/30/2023 in [facility] CARDIAC REHABILITATION  Referring Provider Pamella Pert MD       Encounter Date: 08/19/2023  Check In:  Session Check In - 08/19/23 0942       Check-In   Supervising physician immediately available to respond to emergencies See telemetry face sheet for immediately available MD    Location [facility]    Staff Present Fabio Pierce, MA, RCEP, CCRP, CCET;Heather Fredric Mare, BS, Exercise Physiologist;Debra Laural Benes, RN, BSN    Virtual Visit No    Medication changes reported     No    Fall or balance concerns reported    No    Warm-up and Cool-down Performed on first and last piece of equipment    Resistance Training Performed Yes    VAD Patient? No    PAD/SET Patient? No      Pain Assessment   Currently in Pain? No/denies             Capillary Blood Glucose: No results found for this or any previous visit (from the past 24 hours).    Social History   Tobacco Use  Smoking Status Former   Current packs/day: 0.50   Types: Cigarettes  Smokeless Tobacco Never    Goals Met:  Independence with exercise equipment Exercise tolerated well No report of concerns or symptoms today Strength training completed today  Goals Unmet:  Not Applicable  Comments: Pt able to follow exercise prescription today without complaint.  Will continue to monitor for progression.

## 2023-08-20 ENCOUNTER — Encounter (HOSPITAL_COMMUNITY): Payer: 59

## 2023-08-20 ENCOUNTER — Ambulatory Visit (HOSPITAL_COMMUNITY)
Admission: RE | Admit: 2023-08-20 | Discharge: 2023-08-20 | Disposition: A | Discharge status: Home / Self Care | Payer: 59 | Source: Ambulatory Visit | Attending: Cardiology | Admitting: Cardiology

## 2023-08-20 DIAGNOSIS — I5020 Unspecified systolic (congestive) heart failure: Secondary | ICD-10-CM | POA: Diagnosis present

## 2023-08-20 LAB — BASIC METABOLIC PANEL
Anion gap: 10 (ref 5–15)
BUN: 11 mg/dL (ref 6–20)
CO2: 24 mmol/L (ref 22–32)
Calcium: 9.9 mg/dL (ref 8.9–10.3)
Chloride: 106 mmol/L (ref 98–111)
Creatinine, Ser: 0.77 mg/dL (ref 0.44–1.00)
GFR, Estimated: >60 mL/min (ref >60)
Glucose, Bld: 112 mg/dL — ABNORMAL HIGH (ref 70–99)
Potassium: 5 mmol/L (ref 3.5–5.1)
Sodium: 140 mmol/L (ref 135–145)

## 2023-08-21 ENCOUNTER — Encounter (HOSPITAL_COMMUNITY)
Admission: RE | Admit: 2023-08-21 | Discharge: 2023-08-21 | Disposition: A | Discharge status: Home / Self Care | Payer: 59 | Source: Ambulatory Visit | Attending: Cardiology | Admitting: Cardiology

## 2023-08-21 DIAGNOSIS — I214 Non-ST elevation (NSTEMI) myocardial infarction: Secondary | ICD-10-CM

## 2023-08-21 NOTE — Progress Notes (Signed)
Daily Session Note  Patient Details  Name: Rakiyah Esch MRN: [id number] Date of Birth: 06/27/1971 Referring Provider:   Flowsheet Row CARDIAC REHAB PHASE II ORIENTATION from 07/30/2023 in [facility] CARDIAC REHABILITATION  Referring Provider Pamella Pert MD       Encounter Date: 08/21/2023  Check In:  Session Check In - 08/21/23 0931       Check-In   Supervising physician immediately available to respond to emergencies See telemetry face sheet for immediately available MD    Location [facility]    Staff Present Ross Ludwig, BS, Exercise Physiologist;Tina Jake Shark, Margarite Gouge, RN, BSN;Jaylend Reiland, MA, RCEP, CCRP, CCET    Virtual Visit No    Medication changes reported     No    Fall or balance concerns reported    No    Warm-up and Cool-down Performed on first and last piece of equipment    Resistance Training Performed Yes    VAD Patient? No    PAD/SET Patient? No      Pain Assessment   Currently in Pain? No/denies             Capillary Blood Glucose: Results for orders placed or performed during the hospital encounter of 08/20/23 (from the past 24 hours)  Basic Metabolic Panel (BMET)     Status: Abnormal   Collection Time: 08/20/23 11:16 AM  Result Value Ref Range   Sodium 140 135 - 145 mmol/L   Potassium 5.0 3.5 - 5.1 mmol/L   Chloride 106 98 - 111 mmol/L   CO2 24 22 - 32 mmol/L   Glucose, Bld 112 (H) 70 - 99 mg/dL   BUN 11 6 - 20 mg/dL   Creatinine, Ser 6.21 0.44 - 1.00 mg/dL   Calcium 9.9 8.9 - 30.8 mg/dL   GFR, Estimated >65 >78 mL/min   Anion gap 10 5 - 15      Social History   Tobacco Use  Smoking Status Former   Current packs/day: 0.50   Types: Cigarettes  Smokeless Tobacco Never    Goals Met:  Independence with exercise equipment Exercise tolerated well No report of concerns or symptoms today Strength training completed today  Goals Unmet:  Not Applicable  Comments: Pt able to follow exercise prescription  today without complaint.  Will continue to monitor for progression.

## 2023-08-24 ENCOUNTER — Encounter (HOSPITAL_COMMUNITY)
Admission: RE | Admit: 2023-08-24 | Discharge: 2023-08-24 | Disposition: A | Discharge status: Home / Self Care | Payer: 59 | Source: Ambulatory Visit | Attending: Cardiology | Admitting: Cardiology

## 2023-08-24 DIAGNOSIS — I5020 Unspecified systolic (congestive) heart failure: Secondary | ICD-10-CM | POA: Insufficient documentation

## 2023-08-24 DIAGNOSIS — I214 Non-ST elevation (NSTEMI) myocardial infarction: Secondary | ICD-10-CM | POA: Diagnosis present

## 2023-08-25 ENCOUNTER — Encounter (HOSPITAL_COMMUNITY)
Admission: RE | Admit: 2023-08-25 | Discharge: 2023-08-25 | Disposition: A | Discharge status: Home / Self Care | Payer: 59 | Source: Ambulatory Visit | Attending: Internal Medicine | Admitting: Internal Medicine

## 2023-08-25 ENCOUNTER — Telehealth (HOSPITAL_COMMUNITY): Payer: Self-pay

## 2023-08-25 DIAGNOSIS — I214 Non-ST elevation (NSTEMI) myocardial infarction: Secondary | ICD-10-CM | POA: Diagnosis not present

## 2023-08-25 NOTE — Progress Notes (Signed)
Daily Session Note  Patient Details  Name: Jennifer King MRN: [id number] Date of Birth: [birth year] Referring Provider:   Flowsheet Row CARDIAC REHAB PHASE II ORIENTATION from 07/30/2023 in [facility] CARDIAC REHABILITATION  Referring Provider Pamella Pert MD       Encounter Date: 08/25/2023  Check In:  Session Check In - 08/25/23 1330       Check-In   Supervising physician immediately available to respond to emergencies See telemetry face sheet for immediately available MD    Location [facility]    Staff Present Fabio Pierce, MA, RCEP, CCRP, CCET;Aeryn Medici, RN    Virtual Visit No    Medication changes reported     No    Fall or balance concerns reported    No    Warm-up and Cool-down Performed on first and last piece of equipment    Resistance Training Performed Yes    VAD Patient? No    PAD/SET Patient? No      Pain Assessment   Currently in Pain? No/denies    Multiple Pain Sites No             Capillary Blood Glucose: No results found for this or any previous visit (from the past 24 hours).    Social History   Tobacco Use  Smoking Status Former   Current packs/day: 0.50   Types: Cigarettes  Smokeless Tobacco Never    Goals Met:  Independence with exercise equipment Exercise tolerated well No report of concerns or symptoms today Strength training completed today  Goals Unmet:  Not Applicable  Comments: Pt able to follow exercise prescription today without complaint.  Will continue to monitor for progression.

## 2023-08-26 ENCOUNTER — Encounter (HOSPITAL_COMMUNITY)
Admission: RE | Admit: 2023-08-26 | Discharge: 2023-08-26 | Disposition: A | Discharge status: Home / Self Care | Payer: 59 | Source: Ambulatory Visit | Attending: Cardiology | Admitting: Cardiology

## 2023-08-26 DIAGNOSIS — I214 Non-ST elevation (NSTEMI) myocardial infarction: Secondary | ICD-10-CM | POA: Diagnosis not present

## 2023-08-26 NOTE — Progress Notes (Signed)
 Daily Session Note  Patient Details  Name: Jennifer King MRN: [id number] Date of Birth: 1971-04-29 Referring Provider:   Flowsheet Row CARDIAC REHAB PHASE II ORIENTATION from 07/30/2023 in [facility] CARDIAC REHABILITATION  Referring Provider Trixie Hilda MD       Encounter Date: 08/26/2023  Check In:  Session Check In - 08/26/23 0929       Check-In   Supervising physician immediately available to respond to emergencies See telemetry face sheet for immediately available MD    Location [facility]    Staff Present Powell Benders, BS, Exercise Physiologist;Autym Siess Vonzell, MA, RCEP, CCRP, CCET;Brittany Foley, BSN, RN;Debra Vicci, RN, BSN    Virtual Visit No    Medication changes reported     No    Fall or balance concerns reported    No    Warm-up and Cool-down Performed on first and last piece of equipment    Resistance Training Performed Yes    VAD Patient? No    PAD/SET Patient? No      Pain Assessment   Currently in Pain? No/denies             Capillary Blood Glucose: No results found for this or any previous visit (from the past 24 hours).    Social History   Tobacco Use  Smoking Status Former   Current packs/day: 0.50   Types: Cigarettes  Smokeless Tobacco Never    Goals Met:  Independence with exercise equipment Exercise tolerated well No report of concerns or symptoms today Strength training completed today  Goals Unmet:  Not Applicable  Comments: Pt able to follow exercise prescription today without complaint.  Will continue to monitor for progression.

## 2023-08-27 ENCOUNTER — Encounter (HOSPITAL_COMMUNITY)
Admission: RE | Admit: 2023-08-27 | Discharge: 2023-08-27 | Disposition: A | Discharge status: Home / Self Care | Payer: 59 | Source: Ambulatory Visit | Attending: Internal Medicine | Admitting: Internal Medicine

## 2023-08-27 DIAGNOSIS — I214 Non-ST elevation (NSTEMI) myocardial infarction: Secondary | ICD-10-CM | POA: Diagnosis not present

## 2023-08-27 NOTE — Progress Notes (Signed)
 Daily Session Note  Patient Details  Name: Jennifer King MRN: [id number] Date of Birth: 21-Jun-1971 Referring Provider:   Flowsheet Row CARDIAC REHAB PHASE II ORIENTATION from 07/30/2023 in [facility] CARDIAC REHABILITATION  Referring Provider Trixie Hilda MD       Encounter Date: 08/27/2023  Check In:  Session Check In - 08/27/23 1015       Check-In   Supervising physician immediately available to respond to emergencies See telemetry face sheet for immediately available ER MD    Location [facility]    Staff Present Powell Benders, BS, Exercise Physiologist;Mukund Weinreb Vicci, RN, BSN    Virtual Visit No    Medication changes reported     No    Fall or balance concerns reported    No    Warm-up and Cool-down Performed on first and last piece of equipment    Resistance Training Performed Yes    VAD Patient? No    PAD/SET Patient? No      Pain Assessment   Currently in Pain? No/denies    Multiple Pain Sites No             Capillary Blood Glucose: No results found for this or any previous visit (from the past 24 hours).    Social History   Tobacco Use  Smoking Status Former   Current packs/day: 0.50   Types: Cigarettes  Smokeless Tobacco Never    Goals Met:  Independence with exercise equipment Exercise tolerated well No report of concerns or symptoms today Strength training completed today  Goals Unmet:  Not Applicable  Comments: Pt able to follow exercise prescription today without complaint.  Will continue to monitor for progression.

## 2023-08-28 ENCOUNTER — Encounter (HOSPITAL_COMMUNITY): Payer: 59

## 2023-08-31 ENCOUNTER — Other Ambulatory Visit (HOSPITAL_COMMUNITY): Payer: Self-pay | Admitting: Family Medicine

## 2023-08-31 ENCOUNTER — Encounter (HOSPITAL_COMMUNITY)
Admission: RE | Admit: 2023-08-31 | Discharge: 2023-08-31 | Disposition: A | Discharge status: Home / Self Care | Payer: 59 | Source: Ambulatory Visit | Attending: Cardiology

## 2023-08-31 DIAGNOSIS — I214 Non-ST elevation (NSTEMI) myocardial infarction: Secondary | ICD-10-CM

## 2023-08-31 NOTE — Progress Notes (Signed)
 Daily Session Note  Patient Details  Name: Jennifer King MRN: [id number] Date of Birth: [birth year] Referring Provider:   Flowsheet Row CARDIAC REHAB PHASE II ORIENTATION from 07/30/2023 in [facility] CARDIAC REHABILITATION  Referring Provider Kathlen Para MD       Encounter Date: 08/31/2023  Check In:  Session Check In - 08/31/23 4098       Check-In   Supervising physician immediately available to respond to emergencies See telemetry face sheet for immediately available MD    Location [facility]    Staff Present Clotilda Danish, BS, Exercise Physiologist    Virtual Visit No    Medication changes reported     No    Fall or balance concerns reported    No    Tobacco Cessation No Change    Warm-up and Cool-down Performed on first and last piece of equipment    Resistance Training Performed Yes    VAD Patient? No    PAD/SET Patient? No      Pain Assessment   Currently in Pain? No/denies             Capillary Blood Glucose: No results found for this or any previous visit (from the past 24 hours).    Social History   Tobacco Use  Smoking Status Former   Current packs/day: 0.50   Types: Cigarettes  Smokeless Tobacco Never    Goals Met:  Independence with exercise equipment Exercise tolerated well No report of concerns or symptoms today Strength training completed today  Goals Unmet:  Not Applicable  Comments: Pt able to follow exercise prescription today without complaint.  Will continue to monitor for progression.

## 2023-08-31 NOTE — Progress Notes (Deleted)
 Cardiology Office Note:  .   Date:  08/31/2023  ID:  Jennifer King, DOB [birth year], MRN [id number] PCP: Patient, No Pcp Per  [facility] Providers Cardiologist:  None { Click to update primary MD,subspecialty MD or APP then REFRESH:1}   History of Present Illness: .   Jennifer King is a 53 y.o. female  with past medical history of tobacco abuse and new diagnosis of systolic heart failure and CAD.    Admitted 12/24 with PNA. HsTroponin 1168 >> 1033. ECG showed ST with inferior Q waves. Cardiology was consulted, echo showed EF 25-30%, G2DD, normal RV, moderate to severe MR and moderate AI. Underwent R/LHC showing high-grade mid right RCA lesion with occluded right posterolateral ventricular branch, with mild mid LAD disease. Given lack of reproducible angina and unclear benefit of revascularization, medical therapy was elected. RHC showed elevated filling pressures, preserved CI of 2.7 and PAPi 1.4, PVR 2.7 WU. GDMT titrated and she was discharged home, weight 130 lbs.  ROS: ***  Studies Reviewed: Marland Kitchen         Prior CV Studies: {Select studies to display:26339}  Right and left heart catheterization 06/29/2023   Prox RCA to Mid RCA lesion is 99% stenosed.   Mid RCA to Dist RCA lesion is 80% stenosed.   RPAV lesion is 100% stenosed.   Dist RCA lesion is 60% stenosed.   Mid LAD lesion is 20% stenosed.   1.  Mild mid LAD disease. 2.  High-grade mid right coronary artery lesion that is diffusely diseased with an occluded right posterolateral ventricular branch.  Given the lack of reproducible angina in the unclear benefit of revascularization in this setting, medical therapy will be pursued.  If the patient develops anginal symptoms that are refractory to medical therapy then PCI could be considered. 3.  Fick cardiac output of 4.4 L/min and Fick cardiac index of 2.7 L/min/m with the following hemodynamics:             Right atrial pressure mean of 15 mmHg             Right ventricular  pressure 47/10 with an end-diastolic pressure of 19 mmHg             Wedge pressure mean of 24 mmHg with V waves to 31 mmHg             PA pressure 48/27 with a mean of 36 mmHg             Pulmonary vascular resistance of 2.7 Woods units             PA pulsatility index of 1.4 4.  LVEDP of 28 mmHg 5.  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   Recommendation: Medical therapy for nonischemic cardiomyopathy with coincident coronary artery disease.   Echocardiogram 06/27/2023 1. Left ventricular ejection fraction, by estimation, is 25 to 30%. The  left ventricle has severely decreased function. The left ventricle  demonstrates global hypokinesis. The left ventricular internal cavity size  was moderately dilated. Left  ventricular diastolic parameters are consistent with Grade II diastolic  dysfunction (pseudonormalization).   2. Right ventricular systolic function is normal. The right ventricular  size is normal.   3. Left atrial size was moderately dilated.   4. The mitral valve is normal in structure. Moderate to severe mitral  valve regurgitation. No evidence of mitral stenosis.   5. Tricuspid valve regurgitation is moderate.   6. The aortic valve is tricuspid. There is  mild calcification of the  aortic valve. There is mild thickening of the aortic valve. Aortic valve  regurgitation is moderate. Aortic valve sclerosis is present, with no  evidence of aortic valve stenosis.   7. The inferior vena cava is dilated in size with >50% respiratory  variability, suggesting right atrial pressure of 8 mmHg.     Risk Assessment/Calculations:   {Does this patient have ATRIAL FIBRILLATION?:[phone]} No BP recorded.  {Refresh Note OR Click here to enter BP  :1}***       Physical Exam:   VS:  There were no vitals taken for this visit.   Wt Readings from Last 3 Encounters:  08/10/23 133 lb 12.8 oz (60.7 kg)  07/30/23 133 lb 6.1 oz (60.5 kg)  07/13/23 132 lb (59.9 kg)     GEN: Well nourished, well developed in no acute distress NECK: No JVD; No carotid bruits CARDIAC: ***RRR, no murmurs, rubs, gallops RESPIRATORY:  Clear to auscultation without rales, wheezing or rhonchi  ABDOMEN: Soft, non-tender, non-distended EXTREMITIES:  No edema; No deformity   ASSESSMENT AND PLAN: .    Chronic Systolic Heart Failure - New diagnosis. - Echo (12/24): EF 25-30%, G2DD, normal RV, moderate to severe MR and moderate AI.  - R/LHC (12/24): high-grade mid right RCA lesion with occluded right posterolateral ventricular branch, with mild mid LAD disease; RHC showed mild pulmonary hypertension, normal CO.  - Etiology unclear, lactate 8 on admission. ? Myopathy of critical illness vs myocarditis. Had URI symptoms proceeding hospitalization. CRP 5.8, ESR normal.  - followed by A[facility] and they ordered cMRI to evaluate for evidence of inflammation or infiltrative disease. - Off Jardiance with rash/vaginal itchiness. Consider trial of Farxiga, but will wait a couple weeks. - on spironolactone 12.5 mg q hs. - Continue Entresto 24/26 mg bid. - Continue Toprol XL 25 mg daily. - Labs today, repeat BMET in 1 week. - Plan to repeat echo in 3 months.   CAD - LHC with RCA and LAD lesion; -->treating medically.  - No chest pain - Continue ASA - No statin with elevated LFTs. Check LFTs today, plan to start atorva when stabilized. - CR as above    AI/MR - Echo (12/23): AoV mean gradient 4.0 mmHg, AoV peak gradient 6.2 mmHg, AVA by VTI  1.95 cm.  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 She snores - Sleep study as above - Labs today    Tobacco Use - Smoker since age 51 - Quit x 1 month. Congratulated.       {Are you ordering a CV Procedure (e.g. stress test, cath, DCCV, TEE, etc)?   Press F2         :[id number]}  Dispo: ***  Signed, Jacolyn Reedy, PA-C

## 2023-09-01 ENCOUNTER — Other Ambulatory Visit (HOSPITAL_COMMUNITY): Payer: Self-pay | Admitting: Cardiology

## 2023-09-01 ENCOUNTER — Encounter (HOSPITAL_COMMUNITY): Payer: 59

## 2023-09-01 ENCOUNTER — Encounter (HOSPITAL_COMMUNITY): Payer: Self-pay

## 2023-09-01 MED ORDER — SACUBITRIL-VALSARTAN 24-26 MG PO TABS: 1.0000 | ORAL_TABLET | Freq: Two times a day (BID) | ORAL | 3 refills | Status: DC

## 2023-09-02 ENCOUNTER — Encounter (HOSPITAL_COMMUNITY)
Admission: RE | Admit: 2023-09-02 | Discharge: 2023-09-02 | Disposition: A | Discharge status: Home / Self Care | Payer: 59 | Source: Ambulatory Visit | Attending: Cardiology

## 2023-09-02 DIAGNOSIS — I214 Non-ST elevation (NSTEMI) myocardial infarction: Secondary | ICD-10-CM | POA: Diagnosis not present

## 2023-09-02 NOTE — Progress Notes (Signed)
Daily Session Note  Patient Details  Name: Jennifer King MRN: [id number] Date of Birth: [birth year] Referring Provider:   Flowsheet Row CARDIAC REHAB PHASE II ORIENTATION from 07/30/2023 in [facility] CARDIAC REHABILITATION  Referring Provider Pamella Pert MD       Encounter Date: 09/02/2023  Check In:  Session Check In - 09/02/23 0920       Check-In   Supervising physician immediately available to respond to emergencies See telemetry face sheet for immediately available MD    Location [facility]    Staff Present Ross Ludwig, BS, Exercise Physiologist;Brielynn Sekula Bucoda BSN, RN;Jessica [place], MA, RCEP, CCRP, CCET    Virtual Visit No    Medication changes reported     No    Fall or balance concerns reported    No    Tobacco Cessation No Change    Warm-up and Cool-down Performed on first and last piece of equipment    Resistance Training Performed Yes    VAD Patient? No    PAD/SET Patient? No      Pain Assessment   Currently in Pain? No/denies    Multiple Pain Sites No             Capillary Blood Glucose: No results found for this or any previous visit (from the past 24 hours).    Social History   Tobacco Use  Smoking Status Former   Current packs/day: 0.50   Types: Cigarettes  Smokeless Tobacco Never    Goals Met:  Independence with exercise equipment Exercise tolerated well No report of concerns or symptoms today Strength training completed today  Goals Unmet:  Not Applicable  Comments: Marland KitchenMarland KitchenPt able to follow exercise prescription today without complaint.  Will continue to monitor for progression.

## 2023-09-03 ENCOUNTER — Encounter (HOSPITAL_COMMUNITY)
Admission: RE | Admit: 2023-09-03 | Discharge: 2023-09-03 | Disposition: A | Discharge status: Home / Self Care | Payer: 59 | Source: Ambulatory Visit | Attending: Internal Medicine | Admitting: Internal Medicine

## 2023-09-03 ENCOUNTER — Encounter (HOSPITAL_COMMUNITY): Payer: Self-pay

## 2023-09-03 DIAGNOSIS — I214 Non-ST elevation (NSTEMI) myocardial infarction: Secondary | ICD-10-CM

## 2023-09-03 NOTE — Progress Notes (Signed)
Daily Session Note  Patient Details  Name: Jennifer King MRN: [id number] Date of Birth: 07/13/1971 Referring Provider:   Flowsheet Row CARDIAC REHAB PHASE II ORIENTATION from 07/30/2023 in [facility] CARDIAC REHABILITATION  Referring Provider Pamella Pert MD       Encounter Date: 09/03/2023  Check In:  Session Check In - 09/03/23 1030       Check-In   Supervising physician immediately available to respond to emergencies See telemetry face sheet for immediately available MD    Location [facility]    Staff Present Ross Ludwig, BS, Exercise Physiologist;Hillary Leonidas Romberg BSN, RN;Brittany Foley, BSN, RN    Virtual Visit No    Medication changes reported     No    Fall or balance concerns reported    No    Tobacco Cessation No Change    Warm-up and Cool-down Performed on first and last piece of equipment    Resistance Training Performed Yes    VAD Patient? No    PAD/SET Patient? No      Pain Assessment   Currently in Pain? No/denies    Multiple Pain Sites No             Capillary Blood Glucose: No results found for this or any previous visit (from the past 24 hours).    Social History   Tobacco Use  Smoking Status Former   Current packs/day: 0.50   Types: Cigarettes  Smokeless Tobacco Never    Goals Met:  Independence with exercise equipment Exercise tolerated well No report of concerns or symptoms today Strength training completed today  Goals Unmet:  Not Applicable  Comments: Pt able to follow exercise prescription today without complaint.  Will continue to monitor for progression.

## 2023-09-04 ENCOUNTER — Encounter (HOSPITAL_COMMUNITY)
Admission: RE | Admit: 2023-09-04 | Discharge: 2023-09-04 | Disposition: A | Discharge status: Home / Self Care | Payer: 59 | Source: Ambulatory Visit | Attending: Cardiology | Admitting: Cardiology

## 2023-09-04 DIAGNOSIS — I214 Non-ST elevation (NSTEMI) myocardial infarction: Secondary | ICD-10-CM | POA: Diagnosis not present

## 2023-09-04 NOTE — Progress Notes (Addendum)
Daily Session Note  Patient Details  Name: Jennifer King MRN: [id number] Date of Birth: 1970-12-04 Referring Provider:   Flowsheet Row CARDIAC REHAB PHASE II ORIENTATION from 07/30/2023 in [facility] CARDIAC REHABILITATION  Referring Provider Pamella Pert MD       Encounter Date: 09/04/2023  Check In:  Session Check In - 09/04/23 0943       Check-In   Staff Present Terrance Mass, RN;Jessica Hawkins, MA, RCEP, CCRP, CCET             Capillary Blood Glucose: No results found for this or any previous visit (from the past 24 hours).    Social History   Tobacco Use  Smoking Status Former   Current packs/day: 0.50   Types: Cigarettes  Smokeless Tobacco Never    Goals Met:  Independence with exercise equipment Exercise tolerated well Personal goals reviewed No report of concerns or symptoms today Strength training completed today  Goals Unmet:  Not Applicable  Comments:  Pt able to follow exercise prescription today without complaint.  Will continue to monitor for progression.

## 2023-09-07 ENCOUNTER — Other Ambulatory Visit (HOSPITAL_COMMUNITY): Payer: Self-pay | Admitting: Internal Medicine

## 2023-09-07 ENCOUNTER — Ambulatory Visit (HOSPITAL_BASED_OUTPATIENT_CLINIC_OR_DEPARTMENT_OTHER)
Admission: RE | Admit: 2023-09-07 | Discharge: 2023-09-07 | Disposition: A | Discharge status: Home / Self Care | Payer: 59 | Source: Ambulatory Visit | Attending: Internal Medicine | Admitting: Internal Medicine

## 2023-09-07 ENCOUNTER — Encounter (HOSPITAL_COMMUNITY)
Admission: RE | Admit: 2023-09-07 | Discharge: 2023-09-07 | Disposition: A | Discharge status: Home / Self Care | Payer: 59 | Source: Ambulatory Visit | Attending: Cardiology | Admitting: Cardiology

## 2023-09-07 DIAGNOSIS — I5020 Unspecified systolic (congestive) heart failure: Secondary | ICD-10-CM

## 2023-09-07 DIAGNOSIS — I214 Non-ST elevation (NSTEMI) myocardial infarction: Secondary | ICD-10-CM

## 2023-09-07 MED ORDER — GADOBUTROL 1 MMOL/ML IV SOLN
10.0000 mL | Freq: Once | INTRAVENOUS | Status: AC | PRN
  Administered 2023-09-07: 10 mL via INTRAVENOUS

## 2023-09-07 NOTE — Progress Notes (Signed)
 Daily Session Note  Patient Details  Name: Jennifer King MRN: [id number] Date of Birth: 1970-10-23 Referring Provider:   Flowsheet Row CARDIAC REHAB PHASE II ORIENTATION from 07/30/2023 in [facility] CARDIAC REHABILITATION  Referring Provider Pamella Pert MD       Encounter Date: 09/07/2023  Check In:  Session Check In - 09/07/23 0913       Check-In   Supervising physician immediately available to respond to emergencies See telemetry face sheet for immediately available MD    Location [facility]    Staff Present Fabio Pierce, MA, RCEP, CCRP, CCET;Laureen Kidron, BS, RRT, CPFT;Debra Laural Benes, RN, BSN    Virtual Visit No    Medication changes reported     No    Fall or balance concerns reported    No    Warm-up and Cool-down Performed on first and last piece of equipment    Resistance Training Performed Yes    VAD Patient? No    PAD/SET Patient? No      Pain Assessment   Currently in Pain? No/denies             Capillary Blood Glucose: No results found for this or any previous visit (from the past 24 hours).    Social History   Tobacco Use  Smoking Status Former   Current packs/day: 0.50   Types: Cigarettes  Smokeless Tobacco Never    Goals Met:  Independence with exercise equipment Exercise tolerated well No report of concerns or symptoms today Strength training completed today  Goals Unmet:  Not Applicable  Comments: Pt able to follow exercise prescription today without complaint.  Will continue to monitor for progression.

## 2023-09-08 ENCOUNTER — Ambulatory Visit: Payer: 59 | Admitting: Physician Assistant

## 2023-09-08 ENCOUNTER — Encounter (HOSPITAL_COMMUNITY)
Admission: RE | Admit: 2023-09-08 | Discharge: 2023-09-08 | Disposition: A | Discharge status: Home / Self Care | Payer: 59 | Source: Ambulatory Visit | Attending: Internal Medicine

## 2023-09-08 DIAGNOSIS — I214 Non-ST elevation (NSTEMI) myocardial infarction: Secondary | ICD-10-CM | POA: Diagnosis not present

## 2023-09-08 NOTE — Progress Notes (Signed)
 Daily Session Note  Patient Details  Name: Jennifer King MRN: [id number] Date of Birth: March 08, 1971 Referring Provider:   Flowsheet Row CARDIAC REHAB PHASE II ORIENTATION from 07/30/2023 in [facility] CARDIAC REHABILITATION  Referring Provider Pamella Pert MD       Encounter Date: 09/08/2023  Check In:  Session Check In - 09/08/23 0900       Check-In   Supervising physician immediately available to respond to emergencies See telemetry face sheet for immediately available MD    Location [facility]    Staff Present Ross Ludwig, BS, Exercise Physiologist    Virtual Visit No    Medication changes reported     No    Fall or balance concerns reported    No    Tobacco Cessation No Change    Warm-up and Cool-down Performed on first and last piece of equipment    Resistance Training Performed Yes    VAD Patient? No    PAD/SET Patient? No      Pain Assessment   Currently in Pain? No/denies    Multiple Pain Sites No             Capillary Blood Glucose: No results found for this or any previous visit (from the past 24 hours).    Social History   Tobacco Use  Smoking Status Former   Current packs/day: 0.50   Types: Cigarettes  Smokeless Tobacco Never    Goals Met:  Independence with exercise equipment Exercise tolerated well No report of concerns or symptoms today Strength training completed today  Goals Unmet:  O2 Sat  Comments: Pt able to follow exercise prescription today without complaint.  Will continue to monitor for progression.

## 2023-09-09 ENCOUNTER — Encounter (HOSPITAL_COMMUNITY): Payer: Self-pay | Admitting: *Deleted

## 2023-09-09 ENCOUNTER — Encounter (HOSPITAL_COMMUNITY)
Admission: RE | Admit: 2023-09-09 | Discharge: 2023-09-09 | Disposition: A | Discharge status: Home / Self Care | Payer: 59 | Source: Ambulatory Visit | Attending: Cardiology | Admitting: Cardiology

## 2023-09-09 DIAGNOSIS — I214 Non-ST elevation (NSTEMI) myocardial infarction: Secondary | ICD-10-CM

## 2023-09-09 NOTE — Progress Notes (Signed)
 Daily Session Note  Patient Details  Name: Alisa Stjames MRN: [id number] Date of Birth: [birth year] Referring Provider:   Flowsheet Row CARDIAC REHAB PHASE II ORIENTATION from 07/30/2023 in [facility] CARDIAC REHABILITATION  Referring Provider Pamella Pert MD       Encounter Date: 09/09/2023  Check In:  Session Check In - 09/09/23 4132       Check-In   Supervising physician immediately available to respond to emergencies See telemetry face sheet for immediately available MD    Location [facility]    Staff Present Ross Ludwig, BS, Exercise Physiologist;Sabatino Williard Juanetta Gosling, MA, RCEP, CCRP, Dow Adolph, RN, BSN    Virtual Visit No    Medication changes reported     No    Fall or balance concerns reported    No    Warm-up and Cool-down Performed on first and last piece of equipment    Resistance Training Performed Yes    VAD Patient? No    PAD/SET Patient? No      Pain Assessment   Currently in Pain? No/denies             Capillary Blood Glucose: No results found for this or any previous visit (from the past 24 hours).    Social History   Tobacco Use  Smoking Status Former   Current packs/day: 0.50   Types: Cigarettes  Smokeless Tobacco Never    Goals Met:  Independence with exercise equipment Exercise tolerated well No report of concerns or symptoms today Strength training completed today  Goals Unmet:  Not Applicable  Comments: Pt able to follow exercise prescription today without complaint.  Will continue to monitor for progression.

## 2023-09-10 ENCOUNTER — Encounter (HOSPITAL_COMMUNITY)
Admission: RE | Admit: 2023-09-10 | Discharge: 2023-09-10 | Disposition: A | Discharge status: Home / Self Care | Payer: 59 | Source: Ambulatory Visit | Attending: Internal Medicine | Admitting: Internal Medicine

## 2023-09-10 DIAGNOSIS — I214 Non-ST elevation (NSTEMI) myocardial infarction: Secondary | ICD-10-CM

## 2023-09-10 NOTE — Progress Notes (Signed)
 Daily Session Note  Patient Details  Name: Teresha Hanks MRN: [id number] Date of Birth: [birth year] Referring Provider:   Flowsheet Row CARDIAC REHAB PHASE II ORIENTATION from 07/30/2023 in [facility] CARDIAC REHABILITATION  Referring Provider Pamella Pert MD       Encounter Date: 09/10/2023  Check In:  Session Check In - 09/10/23 1049       Check-In   Supervising physician immediately available to respond to emergencies See telemetry face sheet for immediately available MD    Location [facility]    Staff Present Ross Ludwig, BS, Exercise Physiologist;Debra Laural Benes, RN, Thomos Lemons, MA, RCEP, CCRP, CCET    Virtual Visit No    Medication changes reported     No    Fall or balance concerns reported    No    Warm-up and Cool-down Performed on first and last piece of equipment    Resistance Training Performed Yes    VAD Patient? No    PAD/SET Patient? No      Pain Assessment   Currently in Pain? No/denies             Capillary Blood Glucose: No results found for this or any previous visit (from the past 24 hours).    Social History   Tobacco Use  Smoking Status Former   Current packs/day: 0.50   Types: Cigarettes  Smokeless Tobacco Never    Goals Met:  Independence with exercise equipment Exercise tolerated well No report of concerns or symptoms today Strength training completed today  Goals Unmet:  Not Applicable  Comments: Pt able to follow exercise prescription today without complaint.  Will continue to monitor for progression.

## 2023-09-11 ENCOUNTER — Encounter (HOSPITAL_COMMUNITY)
Admission: RE | Admit: 2023-09-11 | Discharge: 2023-09-11 | Disposition: A | Discharge status: Home / Self Care | Payer: 59 | Source: Ambulatory Visit | Attending: Cardiology | Admitting: Cardiology

## 2023-09-11 VITALS — Ht 63.0 in | Wt 134.7 lb

## 2023-09-11 DIAGNOSIS — I214 Non-ST elevation (NSTEMI) myocardial infarction: Secondary | ICD-10-CM

## 2023-09-11 NOTE — Patient Instructions (Signed)
 Discharge Patient Instructions  Patient Details  Name: Jennifer King MRN: [id number] Date of Birth: 10-29-1970 Referring Provider:  No ref. provider found   Number of Visits: 36  Reason for Discharge:  Patient reached a stable level of exercise. Patient independent in their exercise. Patient has met program and personal goals.  Smoking History:  Social History   Tobacco Use  Smoking Status Former   Current packs/day: 0.50   Types: Cigarettes  Smokeless Tobacco Never    Diagnosis:  NSTEMI (non-ST elevated myocardial infarction) [facility])  Initial Exercise Prescription:  Initial Exercise Prescription - 07/30/23 1500       Date of Initial Exercise RX and Referring Provider   Date 07/30/23    Referring Provider Pamella Pert MD      Oxygen   Maintain Oxygen Saturation 88% or higher      Treadmill   MPH 1.5    Grade 0    Minutes 15    METs 2.3      REL-XR   Level 2    Speed 50    Minutes 15    METs 2      Prescription Details   Frequency (times per week) 3    Duration Progress to 30 minutes of continuous aerobic without signs/symptoms of physical distress      Intensity   THRR 40-80% of Max Heartrate 125-155    Ratings of Perceived Exertion 11-13    Perceived Dyspnea 0-4      Resistance Training   Training Prescription Yes    Weight 4    Reps 10-15             Discharge Exercise Prescription (Final Exercise Prescription Changes):  Exercise Prescription Changes - 09/08/23 1200       Response to Exercise   Blood Pressure (Admit) 100/60    Blood Pressure (Exit) 90/56    Heart Rate (Admit) 95 bpm    Heart Rate (Exercise) 119 bpm    Heart Rate (Exit) 102 bpm    Rating of Perceived Exertion (Exercise) 14    Duration Continue with 30 min of aerobic exercise without signs/symptoms of physical distress.    Intensity THRR unchanged      Progression   Progression Continue to progress workloads to maintain intensity without signs/symptoms of physical  distress.      Resistance Training   Training Prescription Yes    Weight 4    Reps 10-15      Treadmill   MPH 2.5    Grade 2    Minutes 15    METs 3.6      REL-XR   Level 6    Speed 48    Minutes 15    METs 5      Home Exercise Plan   Plans to continue exercise at Home (comment)    Frequency Add 2 additional days to program exercise sessions.             Functional Capacity:  6 Minute Walk     Row Name 07/30/23 1512 09/11/23 0929       6 Minute Walk   Phase -- Discharge    Distance 990 feet 1375 feet    Distance % Change -- 38.9 %    Distance Feet Change -- 385 ft    Walk Time 6 minutes 6 minutes    # of Rest Breaks 0 0    MPH 1.88 2.6    METS 3.45 4.71    RPE  11 12    Perceived Dyspnea  0 --    VO2 Peak 12.07 16.49    Symptoms No No    Resting HR 95 bpm 110 bpm    Resting BP 96/64 104/62    Resting Oxygen Saturation  97 % --    Exercise Oxygen Saturation  during 6 min walk 98 % --    Max Ex. HR 102 bpm 134 bpm    Max Ex. BP 112/60 146/74    2 Minute Post BP 100/60 --            Nutrition & Weight - Outcomes:  Pre Biometrics - 07/30/23 1517       Pre Biometrics   Height 5\' 3"  (1.6 m)    Weight 60.5 kg    Waist Circumference 35 inches    Hip Circumference 36.5 inches    Waist to Hip Ratio 0.96 %    BMI (Calculated) 23.63    Grip Strength 20.6 kg    Single Leg Stand 15.64 seconds             Post Biometrics - 09/11/23 0930        Post  Biometrics   Height 5\' 3"  (1.6 m)    Weight 61.1 kg    Waist Circumference 29 inches    Hip Circumference 36 inches    Waist to Hip Ratio 0.81 %    BMI (Calculated) 23.87    Grip Strength 25.7 kg    Single Leg Stand 30 seconds

## 2023-09-11 NOTE — Progress Notes (Signed)
 Daily Session Note  Patient Details  Name: Ornella Coderre MRN: [id number] Date of Birth: 07-30-1970 Referring Provider:   Flowsheet Row CARDIAC REHAB PHASE II ORIENTATION from 07/30/2023 in [facility] CARDIAC REHABILITATION  Referring Provider Pamella Pert MD       Encounter Date: 09/11/2023  Check In:  Session Check In - 09/11/23 0929       Check-In   Supervising physician immediately available to respond to emergencies See telemetry face sheet for immediately available MD    Location [facility]    Staff Present Ross Ludwig, BS, Exercise Physiologist;Debra Laural Benes, RN, Thomos Lemons, MA, RCEP, CCRP, CCET    Virtual Visit No    Medication changes reported     No    Fall or balance concerns reported    No    Warm-up and Cool-down Performed on first and last piece of equipment    Resistance Training Performed Yes    VAD Patient? No    PAD/SET Patient? No      Pain Assessment   Currently in Pain? No/denies             Capillary Blood Glucose: No results found for this or any previous visit (from the past 24 hours).    Social History   Tobacco Use  Smoking Status Former   Current packs/day: 0.50   Types: Cigarettes  Smokeless Tobacco Never    Goals Met:  Independence with exercise equipment Exercise tolerated well No report of concerns or symptoms today Strength training completed today  Goals Unmet:  Not Applicable  Comments: Pt able to follow exercise prescription today without complaint.  Will continue to monitor for progression.   6 Minute Walk     Row Name 07/30/23 1512 09/11/23 0929       6 Minute Walk   Phase -- Discharge    Distance 990 feet 1375 feet    Distance % Change -- 38.9 %    Distance Feet Change -- 385 ft    Walk Time 6 minutes 6 minutes    # of Rest Breaks 0 0    MPH 1.88 2.6    METS 3.45 4.71    RPE 11 12    Perceived Dyspnea  0 --    VO2 Peak 12.07 16.49    Symptoms No No    Resting HR 95 bpm  110 bpm    Resting BP 96/64 104/62    Resting Oxygen Saturation  97 % --    Exercise Oxygen Saturation  during 6 min walk 98 % --    Max Ex. HR 102 bpm 134 bpm    Max Ex. BP 112/60 146/74    2 Minute Post BP 100/60 --

## 2023-09-14 ENCOUNTER — Encounter (HOSPITAL_COMMUNITY)
Admission: RE | Admit: 2023-09-14 | Discharge: 2023-09-14 | Disposition: A | Discharge status: Home / Self Care | Payer: 59 | Source: Ambulatory Visit | Attending: Cardiology | Admitting: Cardiology

## 2023-09-14 DIAGNOSIS — I214 Non-ST elevation (NSTEMI) myocardial infarction: Secondary | ICD-10-CM

## 2023-09-14 NOTE — Progress Notes (Signed)
 Daily Session Note  Patient Details  Name: Jennifer King MRN: [id number] Date of Birth: 1970/12/25 Referring Provider:   Flowsheet Row CARDIAC REHAB PHASE II ORIENTATION from 07/30/2023 in [facility] CARDIAC REHABILITATION  Referring Provider Pamella Pert MD       Encounter Date: 09/14/2023  Check In:  Session Check In - 09/14/23 0915       Check-In   Supervising physician immediately available to respond to emergencies See telemetry face sheet for immediately available MD    Location [facility]    Staff Present Ross Ludwig, BS, Exercise Physiologist;Jessica Juanetta Gosling, MA, RCEP, CCRP, CCET    Virtual Visit No    Medication changes reported     No    Fall or balance concerns reported    No    Tobacco Cessation No Change    Warm-up and Cool-down Performed on first and last piece of equipment    Resistance Training Performed Yes    VAD Patient? No    PAD/SET Patient? No      Pain Assessment   Currently in Pain? No/denies    Multiple Pain Sites No             Capillary Blood Glucose: No results found for this or any previous visit (from the past 24 hours).    Social History   Tobacco Use  Smoking Status Former   Current packs/day: 0.50   Types: Cigarettes  Smokeless Tobacco Never    Goals Met:  Independence with exercise equipment Exercise tolerated well No report of concerns or symptoms today Strength training completed today  Goals Unmet:  Not Applicable  Comments: Pt able to follow exercise prescription today without complaint.  Will continue to monitor for progression.

## 2023-09-15 ENCOUNTER — Encounter (HOSPITAL_COMMUNITY)
Admission: RE | Admit: 2023-09-15 | Discharge: 2023-09-15 | Disposition: A | Discharge status: Home / Self Care | Payer: 59 | Source: Ambulatory Visit | Attending: Internal Medicine | Admitting: Internal Medicine

## 2023-09-15 DIAGNOSIS — I214 Non-ST elevation (NSTEMI) myocardial infarction: Secondary | ICD-10-CM

## 2023-09-15 NOTE — Progress Notes (Signed)
 Daily Session Note  Patient Details  Name: Jennifer King MRN: [id number] Date of Birth: [birth year] Referring Provider:   Flowsheet Row CARDIAC REHAB PHASE II ORIENTATION from 07/30/2023 in [facility] CARDIAC REHABILITATION  Referring Provider Pamella Pert MD       Encounter Date: 09/15/2023  Check In:  Session Check In - 09/15/23 1031       Check-In   Supervising physician immediately available to respond to emergencies See telemetry face sheet for immediately available MD    Location [facility]    Staff Present Ross Ludwig, BS, Exercise Physiologist;Tyniesha Howald Juanetta Gosling, MA, RCEP, CCRP, CCET;Brittany Roseanne Reno, BSN, RN, WTA-C;Phyllis Billingsley, RN    Virtual Visit No    Medication changes reported     No    Fall or balance concerns reported    No    Warm-up and Cool-down Performed on first and last piece of equipment    Resistance Training Performed Yes    VAD Patient? No    PAD/SET Patient? No      Pain Assessment   Currently in Pain? No/denies             Capillary Blood Glucose: No results found for this or any previous visit (from the past 24 hours).    Social History   Tobacco Use  Smoking Status Former   Current packs/day: 0.50   Types: Cigarettes  Smokeless Tobacco Never    Goals Met:  Independence with exercise equipment Exercise tolerated well No report of concerns or symptoms today Strength training completed today  Goals Unmet:  Not Applicable  Comments: Pt able to follow exercise prescription today without complaint.  Will continue to monitor for progression.

## 2023-09-16 ENCOUNTER — Encounter (HOSPITAL_COMMUNITY)
Admission: RE | Admit: 2023-09-16 | Discharge: 2023-09-16 | Disposition: A | Discharge status: Home / Self Care | Payer: 59 | Source: Ambulatory Visit | Attending: Cardiology

## 2023-09-16 ENCOUNTER — Encounter (HOSPITAL_COMMUNITY): Payer: Self-pay | Admitting: *Deleted

## 2023-09-16 DIAGNOSIS — I214 Non-ST elevation (NSTEMI) myocardial infarction: Secondary | ICD-10-CM | POA: Diagnosis not present

## 2023-09-16 NOTE — Progress Notes (Signed)
 Cardiac Individual Treatment Plan  Patient Details  Name: Jennifer King MRN: [id number] Date of Birth: 1971-05-24 Referring Provider:   Flowsheet Row CARDIAC REHAB PHASE II ORIENTATION from 07/30/2023 in [place] CARDIAC RE[facility]AB[facility]  Referring Provider Pamella Pert MD       Initial Encounter Date:  Flowsheet Row CARDIAC REHAB PHASE II ORIENTATION from 07/30/2023 in [place] CARDIAC REHABILITATION  Date 07/30/23       Visit Diagnosis: NSTEMI (non-ST elevated myocardial infarction) [facility])  Patient's Home Medications on Admission:  Current Outpatient Medications:    aspirin EC 81 MG tablet, Take 1 tablet (81 mg total) by mouth daily. Swallow whole., Disp: 30 tablet, Rfl: 11   atorvastatin (LIPITOR) 20 MG tablet, Take 1 tablet (20 mg total) by mouth daily., Disp: 90 tablet, Rfl: 3   diphenhydrAMINE (BENADRYL) 25 mg capsule, Take 25 mg by mouth as needed., Disp: , Rfl:    metoprolol succinate (TOPROL-XL) 25 MG 24 hr tablet, TAKE 1 TABLET (25 MG TOTAL) BY MOUTH DAILY., Disp: 90 tablet, Rfl: 3   MULTIPLE VITAMIN PO, Take 1 tablet by mouth daily. 1 Gummy daily, Disp: , Rfl:    sacubitril-valsartan (ENTRESTO) 24-26 MG, Take 1 tablet by mouth 2 (two) times daily., Disp: 60 tablet, Rfl: 3   spironolactone (ALDACTONE) 25 MG tablet, Take 1 tablet (25 mg total) by mouth daily., Disp: 30 tablet, Rfl: 3  Past Medical History: No past medical history on file.  Tobacco Use: Social History   Tobacco Use  Smoking Status Former   Current packs/day: 0.50   Types: Cigarettes  Smokeless Tobacco Never    Labs: Review Flowsheet       Latest Ref Rng & Units 06/27/2023 06/29/2023 07/01/2023 07/13/2023  Labs for ITP Cardiac and Pulmonary Rehab  Cholestrol 0 - 200 mg/dL - 956  213  -  LDL (calc) 0 - 99 mg/dL - 64  086  -  HDL-C >57 mg/dL - 20  33  -  Trlycerides <150 mg/dL - 85  98  -  Hemoglobin A1c 4.8 - 5.6 % - - - 5.2   PH, Arterial 7.35 - 7.45 7.35  - - -  PCO2 arterial 32 - 48 mmHg  <18  - - -  Bicarbonate 20.0 - 28.0 mmol/L 8.6  28.7  25.6  25.9  - -  TCO2 22 - 32 mmol/L - 30  27  27   - -  Acid-base deficit 0.0 - 2.0 mmol/L 14.4  - - -  O2 Saturation % 100  59  68  98  - -    Details       Multiple values from one day are sorted in reverse-chronological order         Capillary Blood Glucose: No results found for: "GLUCAP"   Exercise Target Goals: Exercise Program Goal: Individual exercise prescription set using results from initial 6 min walk test and THRR while considering  patient's activity barriers and safety.   Exercise Prescription Goal: Starting with aerobic activity 30 plus minutes a day, 3 days per week for initial exercise prescription. Provide home exercise prescription and guidelines that participant acknowledges understanding prior to discharge.  Activity Barriers & Risk Stratification:  Activity Barriers & Cardiac Risk Stratification - 07/30/23 1400       Activity Barriers & Cardiac Risk Stratification   Activity Barriers None    Cardiac Risk Stratification Moderate             6 Minute Walk:  6 Minute  Walk     Row Name 07/30/23 1512 09/11/23 0929       6 Minute Walk   Phase -- Discharge    Distance 990 feet 1375 feet    Distance % Change -- 38.9 %    Distance Feet Change -- 385 ft    Walk Time 6 minutes 6 minutes    # of Rest Breaks 0 0    MPH 1.88 2.6    METS 3.45 4.71    RPE 11 12    Perceived Dyspnea  0 --    VO2 Peak 12.07 16.49    Symptoms No No    Resting HR 95 bpm 110 bpm    Resting BP 96/64 104/62    Resting Oxygen Saturation  97 % --    Exercise Oxygen Saturation  during 6 min walk 98 % --    Max Ex. HR 102 bpm 134 bpm    Max Ex. BP 112/60 146/74    2 Minute Post BP 100/60 --             Oxygen Initial Assessment:   Oxygen Re-Evaluation:   Oxygen Discharge (Final Oxygen Re-Evaluation):   Initial Exercise Prescription:  Initial Exercise Prescription - 07/30/23 1500       Date of Initial  Exercise RX and Referring Provider   Date 07/30/23    Referring Provider Pamella Pert MD      Oxygen   Maintain Oxygen Saturation 88% or higher      Treadmill   MPH 1.5    Grade 0    Minutes 15    METs 2.3      REL-XR   Level 2    Speed 50    Minutes 15    METs 2      Prescription Details   Frequency (times per week) 3    Duration Progress to 30 minutes of continuous aerobic without signs/symptoms of physical distress      Intensity   THRR 40-80% of Max Heartrate 125-155    Ratings of Perceived Exertion 11-13    Perceived Dyspnea 0-4      Resistance Training   Training Prescription Yes    Weight 4    Reps 10-15             Perform Capillary Blood Glucose checks as needed.  Exercise Prescription Changes:   Exercise Prescription Changes     Row Name 07/30/23 1500 08/03/23 0900 08/03/23 1300 08/26/23 1500 09/08/23 1200     Response to Exercise   Blood Pressure (Admit) 96/64 -- 120/62 100/60 100/60   Blood Pressure (Exercise) 112/60 -- 128/62 -- --   Blood Pressure (Exit) 100/60 -- 116/62 96/60 90/56    Heart Rate (Admit) 95 bpm -- 77 bpm 80 bpm 95 bpm   Heart Rate (Exercise) 102 bpm -- 123 bpm 115 bpm 119 bpm   Heart Rate (Exit) 98 bpm -- 105 bpm 88 bpm 102 bpm   Oxygen Saturation (Admit) 97 % -- -- -- --   Oxygen Saturation (Exercise) 98 % -- -- -- --   Oxygen Saturation (Exit) 97 % -- -- -- --   Rating of Perceived Exertion (Exercise) 11 -- 12 14 14    Perceived Dyspnea (Exercise) 0 -- -- -- --   Duration -- -- Continue with 30 min of aerobic exercise without signs/symptoms of physical distress. Continue with 30 min of aerobic exercise without signs/symptoms of physical distress. Continue with 30 min of aerobic exercise without signs/symptoms  of physical distress.   Intensity -- -- THRR unchanged THRR unchanged THRR unchanged     Progression   Progression -- -- Continue to progress workloads to maintain intensity without signs/symptoms of physical  distress. Continue to progress workloads to maintain intensity without signs/symptoms of physical distress. Continue to progress workloads to maintain intensity without signs/symptoms of physical distress.     Resistance Training   Training Prescription -- -- Yes Yes Yes   Weight -- -- 4 4 4    Reps -- -- 10-15 10-15 10-15     Treadmill   MPH -- -- 1.8 2.4 2.5   Grade -- -- 0.5 2.5 2   Minutes -- -- 15 15 15    METs -- -- 2.5 3.66 3.6     REL-XR   Level -- -- 2 5 6    Speed -- -- 30 45 48   Minutes -- -- 15 15 15    METs -- -- 1.7 4.1 5     Home Exercise Plan   Plans to continue exercise at -- Home (comment) Home (comment) Home (comment) Home (comment)   Frequency -- Add 2 additional days to program exercise sessions. Add 2 additional days to program exercise sessions. Add 2 additional days to program exercise sessions. Add 2 additional days to program exercise sessions.   Initial Home Exercises Provided -- 08/03/23 -- -- --            Exercise Comments:   Exercise Comments     Row Name 07/31/23 8119 09/16/23 1326         Exercise Comments First full day of exercise!  Patient was oriented to gym and equipment including functions, settings, policies, and procedures.  Patient's individual exercise prescription and treatment plan were reviewed.  All starting workloads were established based on the results of the 6 minute walk test done at initial orientation visit.  The plan for exercise progression was also introduced and progression will be customized based on patient's performance and goals. Jennifer King graduated today from  rehab with 36 sessions completed.  Details of the patient's exercise prescription and what She needs to do in order to continue the prescription and progress were discussed with patient.  Patient was given a copy of prescription and goals.  Patient verbalized understanding. Jennifer King plans to continue to exercise by walking at home and gym.               Exercise  Goals and Review:   Exercise Goals     Row Name 07/30/23 1516             Exercise Goals   Increase Physical Activity Yes       Intervention Provide advice, education, support and counseling about physical activity/exercise needs.;Develop an individualized exercise prescription for aerobic and resistive training based on initial evaluation findings, risk stratification, comorbidities and participant's personal goals.       Expected Outcomes Short Term: Attend rehab on a regular basis to increase amount of physical activity.;Long Term: Add in home exercise to make exercise part of routine and to increase amount of physical activity.;Long Term: Exercising regularly at least 3-5 days a week.       Increase Strength and Stamina Yes       Intervention Provide advice, education, support and counseling about physical activity/exercise needs.;Develop an individualized exercise prescription for aerobic and resistive training based on initial evaluation findings, risk stratification, comorbidities and participant's personal goals.       Expected Outcomes Short  Term: Increase workloads from initial exercise prescription for resistance, speed, and METs.;Long Term: Improve cardiorespiratory fitness, muscular endurance and strength as measured by increased METs and functional capacity ( );Short Term: Perform resistance training exercises routinely during rehab and add in resistance training at home       Able to understand and use rate of perceived exertion (RPE) scale Yes       Intervention Provide education and explanation on how to use RPE scale       Expected Outcomes Short Term: Able to use RPE daily in rehab to express subjective intensity level;Long Term:  Able to use RPE to guide intensity level when exercising independently       Able to understand and use Dyspnea scale Yes       Intervention Provide education and explanation on how to use Dyspnea scale       Expected Outcomes Short Term: Able to  use Dyspnea scale daily in rehab to express subjective sense of shortness of breath during exertion;Long Term: Able to use Dyspnea scale to guide intensity level when exercising independently       Knowledge and understanding of Target Heart Rate Range (THRR) Yes       Intervention Provide education and explanation of THRR including how the numbers were predicted and where they are located for reference       Expected Outcomes Short Term: Able to state/look up THRR;Short Term: Able to use daily as guideline for intensity in rehab;Long Term: Able to use THRR to govern intensity when exercising independently       Able to check pulse independently Yes       Intervention Provide education and demonstration on how to check pulse in carotid and radial arteries.;Review the importance of being able to check your own pulse for safety during independent exercise       Expected Outcomes Short Term: Able to explain why pulse checking is important during independent exercise;Long Term: Able to check pulse independently and accurately       Understanding of Exercise Prescription Yes       Intervention Provide education, explanation, and written materials on patient's individual exercise prescription       Expected Outcomes Short Term: Able to explain program exercise prescription;Long Term: Able to explain home exercise prescription to exercise independently                Exercise Goals Re-Evaluation :  Exercise Goals Re-Evaluation     Row Name 07/31/23 [phone] 08/03/23 0950 08/07/23 1408 08/26/23 0933 08/28/23 0733     Exercise Goal Re-Evaluation   Exercise Goals Review Able to understand and use rate of perceived exertion (RPE) scale;Knowledge and understanding of Target Heart Rate Range (THRR);Understanding of Exercise Prescription;Able to understand and use Dyspnea scale Increase Physical Activity;Increase Strength and Stamina;Understanding of Exercise Prescription Increase Physical Activity;Increase  Strength and Stamina;Understanding of Exercise Prescription Increase Physical Activity;Increase Strength and Stamina;Understanding of Exercise Prescription Increase Physical Activity;Increase Strength and Stamina;Understanding of Exercise Prescription   Comments Reviewed RPE and dyspnea scale, THR and program prescription with pt today.  Pt voiced understanding and was given a copy of goals to take home. Jennifer King is doing good in rehab. SHe is on her second visit and hsa pushed herself. She does have a treadmill at home and we went over exercising at home. Jennifer King is doing well in rehab and is tolerating exercise well. This has been her first full week and has been pushing herself. She has increased her  speed on the treadmill with a RPE of 13. Will continue to montior and progress as able. Jennifer King has been doing good in rehab. She has been coming everyday to get back to work. She is increasing her levels and has noticed an increase in her energy levels. She stated that she does not feel like passing out when doing activities anymore. --   Expected Outcomes Short: Use RPE daily to regulate intensity.  Long: Follow program prescription in THR. Short: start exercising at home   long term: Continue to attend rehab Continue to attend rehab short: contiue to pace --    Row Name 09/09/23 1012             Exercise Goal Re-Evaluation   Exercise Goals Review Increase Physical Activity;Increase Strength and Stamina;Understanding of Exercise Prescription       Comments Jennifer King has been doing good in rehab and tolerating exercise well. She continues to come everyday to get done due to going back to work. She continue to increase her levels. Will continue to monitor and progress as able.       Expected Outcomes continue to attend rehab                 Discharge Exercise Prescription (Final Exercise Prescription Changes):  Exercise Prescription Changes - 09/08/23 1200       Response to Exercise   Blood Pressure  (Admit) 100/60    Blood Pressure (Exit) 90/56    Heart Rate (Admit) 95 bpm    Heart Rate (Exercise) 119 bpm    Heart Rate (Exit) 102 bpm    Rating of Perceived Exertion (Exercise) 14    Duration Continue with 30 min of aerobic exercise without signs/symptoms of physical distress.    Intensity THRR unchanged      Progression   Progression Continue to progress workloads to maintain intensity without signs/symptoms of physical distress.      Resistance Training   Training Prescription Yes    Weight 4    Reps 10-15      Treadmill   MPH 2.5    Grade 2    Minutes 15    METs 3.6      REL-XR   Level 6    Speed 48    Minutes 15    METs 5      Home Exercise Plan   Plans to continue exercise at Home (comment)    Frequency Add 2 additional days to program exercise sessions.             Nutrition:  Target Goals: Understanding of nutrition guidelines, daily intake of sodium 1500mg , cholesterol 200mg , calories 30% from fat and 7% or less from saturated fats, daily to have 5 or more servings of fruits and vegetables.  Biometrics:  Pre Biometrics - 07/30/23 1517       Pre Biometrics   Height 5\' 3"  (1.6 m)    Weight 60.5 kg    Waist Circumference 35 inches    Hip Circumference 36.5 inches    Waist to Hip Ratio 0.96 %    BMI (Calculated) 23.63    Grip Strength 20.6 kg    Single Leg Stand 15.64 seconds             Post Biometrics - 09/11/23 0930        Post  Biometrics   Height 5\' 3"  (1.6 m)    Weight 61.1 kg    Waist Circumference 29 inches    Hip  Circumference 36 inches    Waist to Hip Ratio 0.81 %    BMI (Calculated) 23.87    Grip Strength 25.7 kg    Single Leg Stand 30 seconds             Nutrition Therapy Plan and Nutrition Goals:   Nutrition Assessments:  MEDIFICTS Score Key: >=70 Need to make dietary changes  40-70 Heart Healthy Diet <= 40 Therapeutic Level Cholesterol Diet  Flowsheet Row CARDIAC REHAB PHASE II EXERCISE from 09/14/2023 in  [facility] CARDIAC REHABILITATION  Picture Your Plate Total Score on Admission 27  Picture Your Plate Total Score on Discharge 73      Picture Your Plate Scores: <08 Unhealthy dietary pattern with much room for improvement. 41-50 Dietary pattern unlikely to meet recommendations for good health and room for improvement. 51-60 More healthful dietary pattern, with some room for improvement.  >60 Healthy dietary pattern, although there may be some specific behaviors that could be improved.    Nutrition Goals Re-Evaluation:  Nutrition Goals Re-Evaluation     Row Name 08/03/23 0933 08/26/23 0942           Goals   Nutrition Goal healthy eating healthy eating      Comment Jennifer King is doing well in rehab, she has just started the program. She is trying to get into picking healthy chosoice. She is cooking more chicken now by [employer] or baking. She does drink diet coke and water mostly and then drinks tea every now and then. She was drinking more mt dew but has cut back. She does not eat many desserts, she will eat a little bit every now and then. Jennipher is doing well in rehab. She continues to try to pick healthy choices. She has started to use Mrs. Dash insead of salt to season food. She continue to bake or grilled chicken . She is still trying to cut back on sodas.      Expected Outcome Short: focus on cutting back on sodas and drink mostly water    Long term: continue to pick healthy options for meals Short: focus on cutting back on sodas and drink mostly water    Long term: continue to pick healthy options for meals               Nutrition Goals Discharge (Final Nutrition Goals Re-Evaluation):  Nutrition Goals Re-Evaluation - 08/26/23 0942       Goals   Nutrition Goal healthy eating    Comment Jennifer King is doing well in rehab. She continues to try to pick healthy choices. She has started to use Mrs. Dash insead of salt to season food. She continue to bake or grilled chicken . She is still  trying to cut back on sodas.    Expected Outcome Short: focus on cutting back on sodas and drink mostly water    Long term: continue to pick healthy options for meals             Psychosocial: Target Goals: Acknowledge presence or absence of significant depression and/or stress, maximize coping skills, provide positive support system. Participant is able to verbalize types and ability to use techniques and skills needed for reducing stress and depression.  Initial Review & Psychosocial Screening:  Initial Psych Review & Screening - 07/30/23 1511       Initial Review   Current issues with Current Stress Concerns    Source of Stress Concerns Family    Comments Patient's in-laws recently moved closer and  this has been a stressor for patient.      Family Dynamics   Good Support System? Yes    Comments Patient's husband, mother, and a close friend are her support.      Barriers   Psychosocial barriers to participate in program There are no identifiable barriers or psychosocial needs.      Screening Interventions   Interventions Encouraged to exercise;To provide support and resources with identified psychosocial needs;Provide feedback about the scores to participant    Expected Outcomes Long Term goal: The participant improves quality of Life and PHQ9 Scores as seen by post scores and/or verbalization of changes;Short Term goal: Identification and review with participant of any Quality of Life or Depression concerns found by scoring the questionnaire.;Long Term Goal: Stressors or current issues are controlled or eliminated.;Short Term goal: Utilizing psychosocial counselor, staff and physician to assist with identification of specific Stressors or current issues interfering with healing process. Setting desired goal for each stressor or current issue identified.             Quality of Life Scores:  Quality of Life - 09/14/23 0929       Quality of Life   Select Quality of Life       Quality of Life Scores   Health/Function Pre 25.61 %    Health/Function Post 27.43 %    Health/Function % Change 7.11 %    Socioeconomic Pre 23.79 %    Socioeconomic Post 24.64 %    Socioeconomic % Change  3.57 %    Psych/Spiritual Pre 24.64 %    Psych/Spiritual Post 28.79 %    Psych/Spiritual % Change 16.84 %    Family Pre 27.3 %    Family Post 30 %    Family % Change 9.89 %    GLOBAL Pre 25.27 %    GLOBAL Post 27.51 %    GLOBAL % Change 8.86 %            Scores of 19 and below usually indicate a poorer quality of life in these areas.  A difference of  2-3 points is a clinically meaningful difference.  A difference of 2-3 points in the total score of the Quality of Life Index has been associated with significant improvement in overall quality of life, self-image, physical symptoms, and general health in studies assessing change in quality of life.  PHQ-9: Review Flowsheet       09/14/2023 07/30/2023  Depression screen PHQ 2/9  Decreased Interest 0 0  Down, Depressed, Hopeless 0 0  PHQ - 2 Score 0 0  Altered sleeping 0 0  Tired, decreased energy 0 0  Change in appetite 0 0  Feeling bad or failure about yourself  0 0  Trouble concentrating 0 0  Moving slowly or fidgety/restless 0 0  Suicidal thoughts 0 0  PHQ-9 Score 0 0  Difficult doing work/chores Not difficult at all -   Interpretation of Total Score  Total Score Depression Severity:  1-4 = Minimal depression, 5-9 = Mild depression, 10-14 = Moderate depression, 15-19 = Moderately severe depression, 20-27 = Severe depression   Psychosocial Evaluation and Intervention:  Psychosocial Evaluation - 07/30/23 1516       Psychosocial Evaluation & Interventions   Interventions Stress management education;Relaxation education;Encouraged to exercise with the program and follow exercise prescription    Comments Patient was referred to CR with NSTEMI. Her PHQ-9 score was 0. She lives with her husband and 2 school age  children. She  denies any depression or anxiety. She says her in-laws recently moved closer to them from out of town and this has been a stressor for her and her husband and her MI and recent onset of HF has also been a stressor. She recently quit smoking after her MI without any aides. Her husband still smokes but he does not smoke around her. She feels she is managing this well. She says she sleeps well. 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 She plans to return to her work soon. She is also very involved with kids school activities. Her goals for the program are to get stronger and healthier overall and to learn how to eat healthier. She has no barriers identified to complete the program. She is ready to start the program.    Expected Outcomes Short Term: start the program and attend consistently. Long Term: Complete the program meeting her personal goals.    Continue Psychosocial Services  Follow up required by staff             Psychosocial Re-Evaluation:  Psychosocial Re-Evaluation     Row Name 08/03/23 [phone] 08/26/23 1191           Psychosocial Re-Evaluation   Current issues with None Identified None Identified      Comments Jennifer King stated that she does not have any sleep issues and is sleeping through the night. She does have everyday normal stress with house work and her two teenage kids. Jennifer King stated that she does not have any sleep issue and continues to sleep through the night. She is taking care of in laws dog and this is causing her some stress due to the dog not being a fan of her cats. This is only short term for anout 2 weeks.      Expected Outcomes Short: find an outlet to be able to let daily stress out  long term: continue montior daily stress and have no sleep concerns Short: continue to have an outlet to be able to release daily stress   long term: continue to montior daily stress      Interventions Encouraged to attend  Cardiac Rehabilitation for the exercise Encouraged to attend Cardiac Rehabilitation for the exercise      Continue Psychosocial Services  Follow up required by staff Follow up required by staff               Psychosocial Discharge (Final Psychosocial Re-Evaluation):  Psychosocial Re-Evaluation - 08/26/23 4782       Psychosocial Re-Evaluation   Current issues with None Identified    Comments Jennifer King stated that she does not have any sleep issue and continues to sleep through the night. She is taking care of in laws dog and this is causing her some stress due to the dog not being a fan of her cats. This is only short term for anout 2 weeks.    Expected Outcomes Short: continue to have an outlet to be able to release daily stress   long term: continue to montior daily stress    Interventions Encouraged to attend Cardiac Rehabilitation for the exercise    Continue Psychosocial Services  Follow up required by staff             Vocational Rehabilitation: Provide vocational rehab assistance to qualifying candidates.   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             Education: Education Goals: Education classes will be provided on a weekly basis, covering required topics. Participant will state understanding/return demonstration of topics presented.  Learning Barriers/Preferences:  Learning Barriers/Preferences - 07/30/23 1414       Learning Barriers/Preferences   Learning Barriers Sight    Learning Preferences Skilled Demonstration;Audio             Education Topics: Hypertension, Hypertension Reduction -Define heart disease and high blood pressure. Discus how high blood pressure affects the body and ways to reduce  high blood pressure. Flowsheet Row CARDIAC REHAB PHASE II EXERCISE from 09/16/2023 in [place] CARDIAC REHABILITATION  Date 08/05/23  Educator jh  Instruction Review Code 1- Verbalizes Understanding       Exercise and Your Heart -Discuss why it is important to exercise, the FITT principles of exercise, normal and abnormal responses to exercise, and how to exercise safely. Flowsheet Row CARDIAC REHAB PHASE II EXERCISE from 09/16/2023 in [place] CARDIAC REHABILITATION  Date 09/02/23  Educator HB  Instruction Review Code 1- Verbalizes Understanding       Angina -Discuss definition of angina, causes of angina, treatment of angina, and how to decrease risk of having angina.   Cardiac Medications -Review what the following cardiac medications are used for, how they affect the body, and side effects that may occur when taking the medications.  Medications include Aspirin, Beta blockers, calcium channel blockers, ACE Inhibitors, angiotensin receptor blockers, diuretics, digoxin, and antihyperlipidemics. Flowsheet Row CARDIAC REHAB PHASE II EXERCISE from 09/16/2023 in [place] CARDIAC REHABILITATION  Date 08/12/23  Educator hb  Instruction Review Code 1- Verbalizes Understanding       Congestive Heart Failure -Discuss the definition of CHF, how to live with CHF, the signs and symptoms of CHF, and how keep track of weight and sodium intake.   Heart Disease and Intimacy -Discus the effect sexual activity has on the heart, how changes occur during intimacy as we age, and safety during sexual activity. Flowsheet Row CARDIAC REHAB PHASE II EXERCISE from 09/16/2023 in [place] CARDIAC REHABILITATION  Date 09/09/23  Educator [facility]  Instruction Review Code 1- Verbalizes Understanding       Smoking Cessation / COPD -Discuss different methods to quit smoking, the health benefits of quitting smoking, and the definition of COPD.   Nutrition I: Fats -Discuss the types of cholesterol,  what cholesterol does to the heart, and how cholesterol levels can be controlled.   Nutrition II: Labels -Discuss the different components of food labels and how to read food label   Heart Parts/Heart Disease and PAD -Discuss the anatomy of the heart, the pathway of blood circulation through the heart, and these are affected by heart disease.   Stress I: Signs and Symptoms -Discuss the causes of stress, how stress may lead to anxiety and depression, and ways to limit stress.   Stress II: Relaxation -Discuss different types of relaxation techniques to limit stress. Flowsheet Row CARDIAC REHAB PHASE II EXERCISE from 09/16/2023 in [place] CARDIAC REHABILITATION  Date 09/16/23  Educator jh  Instruction Review Code 1- Verbalizes Understanding       Warning Signs of Stroke / TIA -Discuss definition of a stroke, what the signs and symptoms are of a stroke, and how to identify when  someone is having stroke.   Knowledge Questionnaire Score:  Knowledge Questionnaire Score - 09/14/23 0931       Knowledge Questionnaire Score   Pre Score 22/24    Post Score 28/28             Core Components/Risk Factors/Patient Goals at Admission:  Personal Goals and Risk Factors at Admission - 07/30/23 1503       Core Components/Risk Factors/Patient Goals on Admission    Weight Management Weight Maintenance    Heart Failure Yes    Intervention Provide a combined exercise and nutrition program that is supplemented with education, support and counseling about heart failure. Directed toward relieving symptoms such as shortness of breath, decreased exercise tolerance, and extremity edema.    Expected Outcomes Improve functional capacity of life;Short term: Attendance in program 2-3 days a week with increased exercise capacity. Reported lower sodium intake. Reported increased fruit and vegetable intake. Reports medication compliance.;Short term: Daily weights obtained and reported for increase.  Utilizing diuretic protocols set by physician.;Long term: Adoption of self-care skills and reduction of barriers for early signs and symptoms recognition and intervention leading to self-care maintenance.    Stress Yes    Intervention Offer individual and/or small group education and counseling on adjustment to heart disease, stress management and health-related lifestyle change. Teach and support self-help strategies.;Refer participants experiencing significant psychosocial distress to appropriate mental health specialists for further evaluation and treatment. When possible, include family members and significant others in education/counseling sessions.    Expected Outcomes Short Term: Participant demonstrates changes in health-related behavior, relaxation and other stress management skills, ability to obtain effective social support, and compliance with psychotropic medications if prescribed.;Long Term: Emotional wellbeing is indicated by absence of clinically significant psychosocial distress or social isolation.             Core Components/Risk Factors/Patient Goals Review:   Goals and Risk Factor Review     Row Name 08/03/23 0945 08/26/23 0946           Core Components/Risk Factors/Patient Goals Review   Personal Goals Review Weight Management/Obesity Weight Management/Obesity      Review Jennifer King has just started with the program and is on her second visit today. She does not have any goals other then inreaseing her stamina and a good health. She lost weight due to her event and is good with where she it at. Jennifer King is doing well in the program. She is coming everyday to get back to work. She is eating healthy to help with her diet and weight management. She did lose weight due to her event and want to maintain her weight she is good with where is at      Expected Outcomes continue to have no risk factorss continue to have no risk factorss               Core Components/Risk  Factors/Patient Goals at Discharge (Final Review):   Goals and Risk Factor Review - 08/26/23 0946       Core Components/Risk Factors/Patient Goals Review   Personal Goals Review Weight Management/Obesity    Review Jennifer King is doing well in the program. She is coming everyday to get back to work. She is eating healthy to help with her diet and weight management. She did lose weight due to her event and want to maintain her weight she is good with where is at    Expected Outcomes continue to have no risk factorss  ITP Comments:  ITP Comments     Row Name 07/31/23 [phone] 08/03/23 0954 08/19/23 0832 09/16/23 0816 09/16/23 0937   ITP Comments First full day of exercise!  Patient was oriented to gym and equipment including functions, settings, policies, and procedures.  Patient's individual exercise prescription and treatment plan were reviewed.  All starting workloads were established based on the results of the 6 minute walk test done at initial orientation visit.  The plan for exercise progression was also introduced and progression will be customized based on patient's performance and goals. Went over home exercise since patient has a treadmill at home to use. 30 day review completed. ITP sent to Dr. Dina Rich, Medical Director of Cardiac Rehab. Continue with ITP unless changes are made by physician.    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 30 day review completed. ITP sent to Dr. Dina Rich, Medical Director of Cardiac Rehab. Continue with ITP unless changes are made by physician.    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 She is set to graduate this week. Jennifer King graduated today from  rehab with 36 sessions completed.  Details of the patient's exercise prescription and what She needs to do in order to continue the prescription and progress were discussed with patient.  Patient was given a  copy of prescription and goals.  Patient verbalized understanding. Jennifer King plans to continue to exercise by walking at home and gym.            Comments: Discharge ITP

## 2023-09-16 NOTE — Progress Notes (Signed)
 Discharge Progress Report  Patient Details  Name: Jennifer King MRN: [id number] Date of Birth: August 03, 1970 Referring Provider:   Flowsheet Row CARDIAC REHAB PHASE II ORIENTATION from 07/30/2023 in [facility]  Referring Provider Pamella Pert MD        Number of Visits: 36  Reason for Discharge:  Patient reached a stable level of exercise. Patient independent in their exercise. Patient has met program and personal goals.  Smoking History:  Social History   Tobacco Use  Smoking Status Former   Current packs/day: 0.50   Types: Cigarettes  Smokeless Tobacco Never    Diagnosis:  NSTEMI (non-ST elevated myocardial infarction) [facility])  Initial Exercise Prescription:  Initial Exercise Prescription - 07/30/23 1500       Date of Initial Exercise RX and Referring Provider   Date 07/30/23    Referring Provider Pamella Pert MD      Oxygen   Maintain Oxygen Saturation 88% or higher      Treadmill   MPH 1.5    Grade 0    Minutes 15    METs 2.3      REL-XR   Level 2    Speed 50    Minutes 15    METs 2      Prescription Details   Frequency (times per week) 3    Duration Progress to 30 minutes of continuous aerobic without signs/symptoms of physical distress      Intensity   THRR 40-80% of Max Heartrate 125-155    Ratings of Perceived Exertion 11-13    Perceived Dyspnea 0-4      Resistance Training   Training Prescription Yes    Weight 4    Reps 10-15             Discharge Exercise Prescription (Final Exercise Prescription Changes):  Exercise Prescription Changes - 09/08/23 1200       Response to Exercise   Blood Pressure (Admit) 100/60    Blood Pressure (Exit) 90/56    Heart Rate (Admit) 95 bpm    Heart Rate (Exercise) 119 bpm    Heart Rate (Exit) 102 bpm    Rating of Perceived Exertion (Exercise) 14    Duration Continue with 30 min of aerobic exercise without signs/symptoms of physical distress.    Intensity THRR unchanged       Progression   Progression Continue to progress workloads to maintain intensity without signs/symptoms of physical distress.      Resistance Training   Training Prescription Yes    Weight 4    Reps 10-15      Treadmill   MPH 2.5    Grade 2    Minutes 15    METs 3.6      REL-XR   Level 6    Speed 48    Minutes 15    METs 5      Home Exercise Plan   Plans to continue exercise at Home (comment)    Frequency Add 2 additional days to program exercise sessions.             Functional Capacity:  6 Minute Walk     Row Name 07/30/23 1512 09/11/23 0929       6 Minute Walk   Phase -- Discharge    Distance 990 feet 1375 feet    Distance % Change -- 38.9 %    Distance Feet Change -- 385 ft    Walk Time 6 minutes 6 minutes    #  of Rest Breaks 0 0    MPH 1.88 2.6    METS 3.45 4.71    RPE 11 12    Perceived Dyspnea  0 --    VO2 Peak 12.07 16.49    Symptoms No No    Resting HR 95 bpm 110 bpm    Resting BP 96/64 104/62    Resting Oxygen Saturation  97 % --    Exercise Oxygen Saturation  during 6 min walk 98 % --    Max Ex. HR 102 bpm 134 bpm    Max Ex. BP 112/60 146/74    2 Minute Post BP 100/60 --             Psychological, QOL, Others - Outcomes: PHQ 2/9:    09/14/2023    9:31 AM 07/30/2023    2:59 PM  Depression screen PHQ 2/9  Decreased Interest 0 0  Down, Depressed, Hopeless 0 0  PHQ - 2 Score 0 0  Altered sleeping 0 0  Tired, decreased energy 0 0  Change in appetite 0 0  Feeling bad or failure about yourself  0 0  Trouble concentrating 0 0  Moving slowly or fidgety/restless 0 0  Suicidal thoughts 0 0  PHQ-9 Score 0 0  Difficult doing work/chores Not difficult at all     Quality of Life:  Quality of Life - 09/14/23 0929       Quality of Life   Select Quality of Life      Quality of Life Scores   Health/Function Pre 25.61 %    Health/Function Post 27.43 %    Health/Function % Change 7.11 %    Socioeconomic Pre 23.79 %     Socioeconomic Post 24.64 %    Socioeconomic % Change  3.57 %    Psych/Spiritual Pre 24.64 %    Psych/Spiritual Post 28.79 %    Psych/Spiritual % Change 16.84 %    Family Pre 27.3 %    Family Post 30 %    Family % Change 9.89 %    GLOBAL Pre 25.27 %    GLOBAL Post 27.51 %    GLOBAL % Change 8.86 %               Nutrition & Weight - Outcomes:  Pre Biometrics - 07/30/23 1517       Pre Biometrics   Height 5\' 3"  (1.6 m)    Weight 60.5 kg    Waist Circumference 35 inches    Hip Circumference 36.5 inches    Waist to Hip Ratio 0.96 %    BMI (Calculated) 23.63    Grip Strength 20.6 kg    Single Leg Stand 15.64 seconds             Post Biometrics - 09/11/23 0930        Post  Biometrics   Height 5\' 3"  (1.6 m)    Weight 61.1 kg    Waist Circumference 29 inches    Hip Circumference 36 inches    Waist to Hip Ratio 0.81 %    BMI (Calculated) 23.87    Grip Strength 25.7 kg    Single Leg Stand 30 seconds            Education Questionnaire Score:  Knowledge Questionnaire Score - 09/14/23 0931       Knowledge Questionnaire Score   Pre Score 22/24    Post Score 28/28

## 2023-09-16 NOTE — Progress Notes (Addendum)
 Daily Session Note  Patient Details  Name: Mckinna Demars MRN: [id number] Date of Birth: 02-16-1971 Referring Provider:   Flowsheet Row CARDIAC REHAB PHASE II ORIENTATION from 07/30/2023 in [facility] CARDIAC REHABILITATION  Referring Provider Pamella Pert MD       Encounter Date: 09/16/2023  Check In:  Session Check In - 09/16/23 0915       Check-In   Supervising physician immediately available to respond to emergencies See telemetry face sheet for immediately available MD    Location [facility]    Staff Present Ross Ludwig, BS, Exercise Physiologist;Brittany Foley, BSN, RN    Virtual Visit No    Medication changes reported     No    Fall or balance concerns reported    No    Tobacco Cessation No Change    Warm-up and Cool-down Performed on first and last piece of equipment    Resistance Training Performed Yes    VAD Patient? No    PAD/SET Patient? No      Pain Assessment   Currently in Pain? No/denies    Multiple Pain Sites No             Capillary Blood Glucose: No results found for this or any previous visit (from the past 24 hours).    Social History   Tobacco Use  Smoking Status Former   Current packs/day: 0.50   Types: Cigarettes  Smokeless Tobacco Never    Goals Met:  Independence with exercise equipment Exercise tolerated well No report of concerns or symptoms today Strength training completed today  Goals Unmet:  Not Applicable  Comments:  Parilee graduated today from  rehab with 36 sessions completed.  Details of the patient's exercise prescription and what She needs to do in order to continue the prescription and progress were discussed with patient.  Patient was given a copy of prescription and goals.  Patient verbalized understanding. Darianna plans to continue to exercise by walking at home and gym.

## 2023-09-16 NOTE — Progress Notes (Signed)
 Cardiac Individual Treatment Plan  Patient Details  Name: Jennifer King MRN: [id number] Date of Birth: 11-14-1970 Referring Provider:   Flowsheet Row CARDIAC REHAB PHASE II ORIENTATION from 07/30/2023 in [facility]  Referring Provider Pamella Pert MD       Initial Encounter Date:  Flowsheet Row CARDIAC REHAB PHASE II ORIENTATION from 07/30/2023 in [place] CARDIAC REHABILITATION  Date 07/30/23       Visit Diagnosis: NSTEMI (non-ST elevated myocardial infarction) [facility])  Patient's Home Medications on Admission:  Current Outpatient Medications:    aspirin EC 81 MG tablet, Take 1 tablet (81 mg total) by mouth daily. Swallow whole., Disp: 30 tablet, Rfl: 11   atorvastatin (LIPITOR) 20 MG tablet, Take 1 tablet (20 mg total) by mouth daily., Disp: 90 tablet, Rfl: 3   diphenhydrAMINE (BENADRYL) 25 mg capsule, Take 25 mg by mouth as needed., Disp: , Rfl:    metoprolol succinate (TOPROL-XL) 25 MG 24 hr tablet, TAKE 1 TABLET (25 MG TOTAL) BY MOUTH DAILY., Disp: 90 tablet, Rfl: 3   MULTIPLE VITAMIN PO, Take 1 tablet by mouth daily. 1 Gummy daily, Disp: , Rfl:    sacubitril-valsartan (ENTRESTO) 24-26 MG, Take 1 tablet by mouth 2 (two) times daily., Disp: 60 tablet, Rfl: 3   spironolactone (ALDACTONE) 25 MG tablet, Take 1 tablet (25 mg total) by mouth daily., Disp: 30 tablet, Rfl: 3  Past Medical History: No past medical history on file.  Tobacco Use: Social History   Tobacco Use  Smoking Status Former   Current packs/day: 0.50   Types: Cigarettes  Smokeless Tobacco Never    Labs: Review Flowsheet       Latest Ref Rng & Units 06/27/2023 06/29/2023 07/01/2023 07/13/2023  Labs for ITP Cardiac and Pulmonary Rehab  Cholestrol 0 - 200 mg/dL - 409  811  -  LDL (calc) 0 - 99 mg/dL - 64  914  -  HDL-C >78 mg/dL - 20  33  -  Trlycerides <150 mg/dL - 85  98  -  Hemoglobin A1c 4.8 - 5.6 % - - - 5.2   PH, Arterial 7.35 - 7.45 7.35  - - -  PCO2 arterial 32 - 48 mmHg  <18  - - -  Bicarbonate 20.0 - 28.0 mmol/L 8.6  28.7  25.6  25.9  - -  TCO2 22 - 32 mmol/L - 30  27  27   - -  Acid-base deficit 0.0 - 2.0 mmol/L 14.4  - - -  O2 Saturation % 100  59  68  98  - -    Details       Multiple values from one day are sorted in reverse-chronological order         Capillary Blood Glucose: No results found for: "GLUCAP"   Exercise Target Goals: Exercise Program Goal: Individual exercise prescription set using results from initial 6 min walk test and THRR while considering  patient's activity barriers and safety.   Exercise Prescription Goal: Starting with aerobic activity 30 plus minutes a day, 3 days per week for initial exercise prescription. Provide home exercise prescription and guidelines that participant acknowledges understanding prior to discharge.  Activity Barriers & Risk Stratification:  Activity Barriers & Cardiac Risk Stratification - 07/30/23 1400       Activity Barriers & Cardiac Risk Stratification   Activity Barriers None    Cardiac Risk Stratification Moderate             6 Minute Walk:  6 Minute  Walk     Row Name 07/30/23 1512 09/11/23 0929       6 Minute Walk   Phase -- Discharge    Distance 990 feet 1375 feet    Distance % Change -- 38.9 %    Distance Feet Change -- 385 ft    Walk Time 6 minutes 6 minutes    # of Rest Breaks 0 0    MPH 1.88 2.6    METS 3.45 4.71    RPE 11 12    Perceived Dyspnea  0 --    VO2 Peak 12.07 16.49    Symptoms No No    Resting HR 95 bpm 110 bpm    Resting BP 96/64 104/62    Resting Oxygen Saturation  97 % --    Exercise Oxygen Saturation  during 6 min walk 98 % --    Max Ex. HR 102 bpm 134 bpm    Max Ex. BP 112/60 146/74    2 Minute Post BP 100/60 --             Oxygen Initial Assessment:   Oxygen Re-Evaluation:   Oxygen Discharge (Final Oxygen Re-Evaluation):   Initial Exercise Prescription:  Initial Exercise Prescription - 07/30/23 1500       Date of Initial  Exercise RX and Referring Provider   Date 07/30/23    Referring Provider Pamella Pert MD      Oxygen   Maintain Oxygen Saturation 88% or higher      Treadmill   MPH 1.5    Grade 0    Minutes 15    METs 2.3      REL-XR   Level 2    Speed 50    Minutes 15    METs 2      Prescription Details   Frequency (times per week) 3    Duration Progress to 30 minutes of continuous aerobic without signs/symptoms of physical distress      Intensity   THRR 40-80% of Max Heartrate 125-155    Ratings of Perceived Exertion 11-13    Perceived Dyspnea 0-4      Resistance Training   Training Prescription Yes    Weight 4    Reps 10-15             Perform Capillary Blood Glucose checks as needed.  Exercise Prescription Changes:   Exercise Prescription Changes     Row Name 07/30/23 1500 08/03/23 0900 08/03/23 1300 08/26/23 1500 09/08/23 1200     Response to Exercise   Blood Pressure (Admit) 96/64 -- 120/62 100/60 100/60   Blood Pressure (Exercise) 112/60 -- 128/62 -- --   Blood Pressure (Exit) 100/60 -- 116/62 96/60 90/56    Heart Rate (Admit) 95 bpm -- 77 bpm 80 bpm 95 bpm   Heart Rate (Exercise) 102 bpm -- 123 bpm 115 bpm 119 bpm   Heart Rate (Exit) 98 bpm -- 105 bpm 88 bpm 102 bpm   Oxygen Saturation (Admit) 97 % -- -- -- --   Oxygen Saturation (Exercise) 98 % -- -- -- --   Oxygen Saturation (Exit) 97 % -- -- -- --   Rating of Perceived Exertion (Exercise) 11 -- 12 14 14    Perceived Dyspnea (Exercise) 0 -- -- -- --   Duration -- -- Continue with 30 min of aerobic exercise without signs/symptoms of physical distress. Continue with 30 min of aerobic exercise without signs/symptoms of physical distress. Continue with 30 min of aerobic exercise without signs/symptoms  of physical distress.   Intensity -- -- THRR unchanged THRR unchanged THRR unchanged     Progression   Progression -- -- Continue to progress workloads to maintain intensity without signs/symptoms of physical  distress. Continue to progress workloads to maintain intensity without signs/symptoms of physical distress. Continue to progress workloads to maintain intensity without signs/symptoms of physical distress.     Resistance Training   Training Prescription -- -- Yes Yes Yes   Weight -- -- 4 4 4    Reps -- -- 10-15 10-15 10-15     Treadmill   MPH -- -- 1.8 2.4 2.5   Grade -- -- 0.5 2.5 2   Minutes -- -- 15 15 15    METs -- -- 2.5 3.66 3.6     REL-XR   Level -- -- 2 5 6    Speed -- -- 30 45 48   Minutes -- -- 15 15 15    METs -- -- 1.7 4.1 5     Home Exercise Plan   Plans to continue exercise at -- Home (comment) Home (comment) Home (comment) Home (comment)   Frequency -- Add 2 additional days to program exercise sessions. Add 2 additional days to program exercise sessions. Add 2 additional days to program exercise sessions. Add 2 additional days to program exercise sessions.   Initial Home Exercises Provided -- 08/03/23 -- -- --            Exercise Comments:   Exercise Comments     Row Name 07/31/23 [phone]           Exercise Comments First full day of exercise!  Patient was oriented to gym and equipment including functions, settings, policies, and procedures.  Patient's individual exercise prescription and treatment plan were reviewed.  All starting workloads were established based on the results of the 6 minute walk test done at initial orientation visit.  The plan for exercise progression was also introduced and progression will be customized based on patient's performance and goals.                Exercise Goals and Review:   Exercise Goals     Row Name 07/30/23 1516             Exercise Goals   Increase Physical Activity Yes       Intervention Provide advice, education, support and counseling about physical activity/exercise needs.;Develop an individualized exercise prescription for aerobic and resistive training based on initial evaluation findings, risk  stratification, comorbidities and participant's personal goals.       Expected Outcomes Short Term: Attend rehab on a regular basis to increase amount of physical activity.;Long Term: Add in home exercise to make exercise part of routine and to increase amount of physical activity.;Long Term: Exercising regularly at least 3-5 days a week.       Increase Strength and Stamina Yes       Intervention Provide advice, education, support and counseling about physical activity/exercise needs.;Develop an individualized exercise prescription for aerobic and resistive training based on initial evaluation findings, risk stratification, comorbidities and participant's personal goals.       Expected Outcomes Short Term: Increase workloads from initial exercise prescription for resistance, speed, and METs.;Long Term: Improve cardiorespiratory fitness, muscular endurance and strength as measured by increased METs and functional capacity ( );Short Term: Perform resistance training exercises routinely during rehab and add in resistance training at home       Able to understand and use rate of perceived exertion (RPE)  scale Yes       Intervention Provide education and explanation on how to use RPE scale       Expected Outcomes Short Term: Able to use RPE daily in rehab to express subjective intensity level;Long Term:  Able to use RPE to guide intensity level when exercising independently       Able to understand and use Dyspnea scale Yes       Intervention Provide education and explanation on how to use Dyspnea scale       Expected Outcomes Short Term: Able to use Dyspnea scale daily in rehab to express subjective sense of shortness of breath during exertion;Long Term: Able to use Dyspnea scale to guide intensity level when exercising independently       Knowledge and understanding of Target Heart Rate Range (THRR) Yes       Intervention Provide education and explanation of THRR including how the numbers were predicted  and where they are located for reference       Expected Outcomes Short Term: Able to state/look up THRR;Short Term: Able to use daily as guideline for intensity in rehab;Long Term: Able to use THRR to govern intensity when exercising independently       Able to check pulse independently Yes       Intervention Provide education and demonstration on how to check pulse in carotid and radial arteries.;Review the importance of being able to check your own pulse for safety during independent exercise       Expected Outcomes Short Term: Able to explain why pulse checking is important during independent exercise;Long Term: Able to check pulse independently and accurately       Understanding of Exercise Prescription Yes       Intervention Provide education, explanation, and written materials on patient's individual exercise prescription       Expected Outcomes Short Term: Able to explain program exercise prescription;Long Term: Able to explain home exercise prescription to exercise independently                Exercise Goals Re-Evaluation :  Exercise Goals Re-Evaluation     Row Name 07/31/23 [phone] 08/03/23 0950 08/07/23 1408 08/26/23 0933 08/28/23 0733     Exercise Goal Re-Evaluation   Exercise Goals Review Able to understand and use rate of perceived exertion (RPE) scale;Knowledge and understanding of Target Heart Rate Range (THRR);Understanding of Exercise Prescription;Able to understand and use Dyspnea scale Increase Physical Activity;Increase Strength and Stamina;Understanding of Exercise Prescription Increase Physical Activity;Increase Strength and Stamina;Understanding of Exercise Prescription Increase Physical Activity;Increase Strength and Stamina;Understanding of Exercise Prescription Increase Physical Activity;Increase Strength and Stamina;Understanding of Exercise Prescription   Comments Reviewed RPE and dyspnea scale, THR and program prescription with pt today.  Pt voiced understanding and  was given a copy of goals to take home. Jennifer King is doing good in rehab. SHe is on her second visit and hsa pushed herself. She does have a treadmill at home and we went over exercising at home. Jennifer King is doing well in rehab and is tolerating exercise well. This has been her first full week and has been pushing herself. She has increased her speed on the treadmill with a RPE of 13. Will continue to montior and progress as able. Jennifer King has been doing good in rehab. She has been coming everyday to get back to work. She is increasing her levels and has noticed an increase in her energy levels. She stated that she does not feel like passing out when doing  activities anymore. --   Expected Outcomes Short: Use RPE daily to regulate intensity.  Long: Follow program prescription in THR. Short: start exercising at home   long term: Continue to attend rehab Continue to attend rehab short: contiue to pace --    Row Name 09/09/23 1012             Exercise Goal Re-Evaluation   Exercise Goals Review Increase Physical Activity;Increase Strength and Stamina;Understanding of Exercise Prescription       Comments Jennifer King has been doing good in rehab and tolerating exercise well. She continues to come everyday to get done due to going back to work. She continue to increase her levels. Will continue to monitor and progress as able.       Expected Outcomes continue to attend rehab                 Discharge Exercise Prescription (Final Exercise Prescription Changes):  Exercise Prescription Changes - 09/08/23 1200       Response to Exercise   Blood Pressure (Admit) 100/60    Blood Pressure (Exit) 90/56    Heart Rate (Admit) 95 bpm    Heart Rate (Exercise) 119 bpm    Heart Rate (Exit) 102 bpm    Rating of Perceived Exertion (Exercise) 14    Duration Continue with 30 min of aerobic exercise without signs/symptoms of physical distress.    Intensity THRR unchanged      Progression   Progression Continue to progress  workloads to maintain intensity without signs/symptoms of physical distress.      Resistance Training   Training Prescription Yes    Weight 4    Reps 10-15      Treadmill   MPH 2.5    Grade 2    Minutes 15    METs 3.6      REL-XR   Level 6    Speed 48    Minutes 15    METs 5      Home Exercise Plan   Plans to continue exercise at Home (comment)    Frequency Add 2 additional days to program exercise sessions.             Nutrition:  Target Goals: Understanding of nutrition guidelines, daily intake of sodium 1500mg , cholesterol 200mg , calories 30% from fat and 7% or less from saturated fats, daily to have 5 or more servings of fruits and vegetables.  Biometrics:  Pre Biometrics - 07/30/23 1517       Pre Biometrics   Height 5\' 3"  (1.6 m)    Weight 133 lb 6.1 oz (60.5 kg)    Waist Circumference 35 inches    Hip Circumference 36.5 inches    Waist to Hip Ratio 0.96 %    BMI (Calculated) 23.63    Grip Strength 20.6 kg    Single Leg Stand 15.64 seconds             Post Biometrics - 09/11/23 0930        Post  Biometrics   Height 5\' 3"  (1.6 m)    Weight 134 lb 11.2 oz (61.1 kg)    Waist Circumference 29 inches    Hip Circumference 36 inches    Waist to Hip Ratio 0.81 %    BMI (Calculated) 23.87    Grip Strength 25.7 kg    Single Leg Stand 30 seconds             Nutrition Therapy Plan and Nutrition Goals:  Nutrition Assessments:  MEDIFICTS Score Key: >=70 Need to make dietary changes  40-70 Heart Healthy Diet <= 40 Therapeutic Level Cholesterol Diet  Flowsheet Row CARDIAC REHAB PHASE II EXERCISE from 09/14/2023 in [facility] CARDIAC REH[facility]ILITATION  Picture Your Plate Total Score on Admission 27  Picture Your Plate Total Score on Discharge 73      Picture Your Plate Scores: <16 Unhealthy dietary pattern with much room for improvement. 41-50 Dietary pattern unlikely to meet recommendations for good health and room for improvement. 51-60  More healthful dietary pattern, with some room for improvement.  >60 Healthy dietary pattern, although there may be some specific behaviors that could be improved.    Nutrition Goals Re-Evaluation:  Nutrition Goals Re-Evaluation     Row Name 08/03/23 0933 08/26/23 0942           Goals   Nutrition Goal healthy eating healthy eating      Comment Jennifer King is doing well in rehab, she has just started the program. She is trying to get into picking healthy chosoice. She is cooking more chicken now by [employer] or baking. She does drink diet coke and water mostly and then drinks tea every now and then. She was drinking more mt dew but has cut back. She does not eat many desserts, she will eat a little bit every now and then. Jennifer King is doing well in rehab. She continues to try to pick healthy choices. She has started to use Mrs. Dash insead of salt to season food. She continue to bake or grilled chicken . She is still trying to cut back on sodas.      Expected Outcome Short: focus on cutting back on sodas and drink mostly water    Long term: continue to pick healthy options for meals Short: focus on cutting back on sodas and drink mostly water    Long term: continue to pick healthy options for meals               Nutrition Goals Discharge (Final Nutrition Goals Re-Evaluation):  Nutrition Goals Re-Evaluation - 08/26/23 0942       Goals   Nutrition Goal healthy eating    Comment Jennifer King is doing well in rehab. She continues to try to pick healthy choices. She has started to use Mrs. Dash insead of salt to season food. She continue to bake or grilled chicken . She is still trying to cut back on sodas.    Expected Outcome Short: focus on cutting back on sodas and drink mostly water    Long term: continue to pick healthy options for meals             Psychosocial: Target Goals: Acknowledge presence or absence of significant depression and/or stress, maximize coping skills, provide positive support  system. Participant is able to verbalize types and ability to use techniques and skills needed for reducing stress and depression.  Initial Review & Psychosocial Screening:  Initial Psych Review & Screening - 07/30/23 1511       Initial Review   Current issues with Current Stress Concerns    Source of Stress Concerns Family    Comments Patient's in-laws recently moved closer and this has been a stressor for patient.      Family Dynamics   Good Support System? Yes    Comments Patient's husband, mother, and a close friend are her support.      Barriers   Psychosocial barriers to participate in program There are no identifiable barriers  or psychosocial needs.      Screening Interventions   Interventions Encouraged to exercise;To provide support and resources with identified psychosocial needs;Provide feedback about the scores to participant    Expected Outcomes Long Term goal: The participant improves quality of Life and PHQ9 Scores as seen by post scores and/or verbalization of changes;Short Term goal: Identification and review with participant of any Quality of Life or Depression concerns found by scoring the questionnaire.;Long Term Goal: Stressors or current issues are controlled or eliminated.;Short Term goal: Utilizing psychosocial counselor, staff and physician to assist with identification of specific Stressors or current issues interfering with healing process. Setting desired goal for each stressor or current issue identified.             Quality of Life Scores:  Quality of Life - 09/14/23 0929       Quality of Life   Select Quality of Life      Quality of Life Scores   Health/Function Pre 25.61 %    Health/Function Post 27.43 %    Health/Function % Change 7.11 %    Socioeconomic Pre 23.79 %    Socioeconomic Post 24.64 %    Socioeconomic % Change  3.57 %    Psych/Spiritual Pre 24.64 %    Psych/Spiritual Post 28.79 %    Psych/Spiritual % Change 16.84 %    Family Pre  27.3 %    Family Post 30 %    Family % Change 9.89 %    GLOBAL Pre 25.27 %    GLOBAL Post 27.51 %    GLOBAL % Change 8.86 %            Scores of 19 and below usually indicate a poorer quality of life in these areas.  A difference of  2-3 points is a clinically meaningful difference.  A difference of 2-3 points in the total score of the Quality of Life Index has been associated with significant improvement in overall quality of life, self-image, physical symptoms, and general health in studies assessing change in quality of life.  PHQ-9: Review Flowsheet       09/14/2023 07/30/2023  Depression screen PHQ 2/9  Decreased Interest 0 0  Down, Depressed, Hopeless 0 0  PHQ - 2 Score 0 0  Altered sleeping 0 0  Tired, decreased energy 0 0  Change in appetite 0 0  Feeling bad or failure about yourself  0 0  Trouble concentrating 0 0  Moving slowly or fidgety/restless 0 0  Suicidal thoughts 0 0  PHQ-9 Score 0 0  Difficult doing work/chores Not difficult at all -   Interpretation of Total Score  Total Score Depression Severity:  1-4 = Minimal depression, 5-9 = Mild depression, 10-14 = Moderate depression, 15-19 = Moderately severe depression, 20-27 = Severe depression   Psychosocial Evaluation and Intervention:  Psychosocial Evaluation - 07/30/23 1516       Psychosocial Evaluation & Interventions   Interventions Stress management education;Relaxation education;Encouraged to exercise with the program and follow exercise prescription    Comments Patient was referred to CR with NSTEMI. Her PHQ-9 score was 0. She lives with her husband and 2 school age children. She denies any depression or anxiety. She says her in-laws recently moved closer to them from out of town and this has been a stressor for her and her husband and her MI and recent onset of HF has also been a stressor. She recently quit smoking after her MI without any aides. Her husband  still smokes but he does not smoke around her.  She feels she is managing this well. She says she sleeps well. 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 She plans to return to her work soon. She is also very involved with kids school activities. Her goals for the program are to get stronger and healthier overall and to learn how to eat healthier. She has no barriers identified to complete the program. She is ready to start the program.    Expected Outcomes Short Term: start the program and attend consistently. Long Term: Complete the program meeting her personal goals.    Continue Psychosocial Services  Follow up required by staff             Psychosocial Re-Evaluation:  Psychosocial Re-Evaluation     Row Name 08/03/23 [phone] 08/26/23 9604           Psychosocial Re-Evaluation   Current issues with None Identified None Identified      Comments Jennifer King stated that she does not have any sleep issues and is sleeping through the night. She does have everyday normal stress with house work and her two teenage kids. Jennifer King stated that she does not have any sleep issue and continues to sleep through the night. She is taking care of in laws dog and this is causing her some stress due to the dog not being a fan of her cats. This is only short term for anout 2 weeks.      Expected Outcomes Short: find an outlet to be able to let daily stress out  long term: continue montior daily stress and have no sleep concerns Short: continue to have an outlet to be able to release daily stress   long term: continue to montior daily stress      Interventions Encouraged to attend Cardiac Rehabilitation for the exercise Encouraged to attend Cardiac Rehabilitation for the exercise      Continue Psychosocial Services  Follow up required by staff Follow up required by staff               Psychosocial Discharge (Final Psychosocial Re-Evaluation):  Psychosocial Re-Evaluation - 08/26/23 5409       Psychosocial  Re-Evaluation   Current issues with None Identified    Comments Jennifer King stated that she does not have any sleep issue and continues to sleep through the night. She is taking care of in laws dog and this is causing her some stress due to the dog not being a fan of her cats. This is only short term for anout 2 weeks.    Expected Outcomes Short: continue to have an outlet to be able to release daily stress   long term: continue to montior daily stress    Interventions Encouraged to attend Cardiac Rehabilitation for the exercise    Continue Psychosocial Services  Follow up required by staff             Vocational Rehabilitation: Provide vocational rehab assistance to qualifying candidates.   Vocational Rehab Evaluation & Intervention:  Vocational Rehab - 07/30/23 1513       Initial Vocational Rehab Evaluation & Intervention   Assessment shows need for Vocational Rehabilitation No      Vocational Rehab Re-Evaulation   Comments Patient plans to return to substitute teaching.             Education: Education Goals: Education classes will be provided on a weekly basis, covering  required topics. Participant will state understanding/return demonstration of topics presented.  Learning Barriers/Preferences:  Learning Barriers/Preferences - 07/30/23 1414       Learning Barriers/Preferences   Learning Barriers Sight    Learning Preferences Skilled Demonstration;Audio             Education Topics: Hypertension, Hypertension Reduction -Define heart disease and high blood pressure. Discus how high blood pressure affects the body and ways to reduce high blood pressure. Flowsheet Row CARDIAC REHAB PHASE II EXERCISE from 09/10/2023 in [place] CARDIAC REHABILITATION  Date 08/05/23  Educator jh  Instruction Review Code 1- Verbalizes Understanding       Exercise and Your Heart -Discuss why it is important to exercise, the FITT principles of exercise, normal and abnormal  responses to exercise, and how to exercise safely. Flowsheet Row CARDIAC REHAB PHASE II EXERCISE from 09/10/2023 in [place] [facility]  Date 09/02/23  Educator HB  Instruction Review Code 1- Verbalizes Understanding       Angina -Discuss definition of angina, causes of angina, treatment of angina, and how to decrease risk of having angina.   Cardiac Medications -Review what the following cardiac medications are used for, how they affect the body, and side effects that may occur when taking the medications.  Medications include Aspirin, Beta blockers, calcium channel blockers, ACE Inhibitors, angiotensin receptor blockers, diuretics, digoxin, and antihyperlipidemics. Flowsheet Row CARDIAC REHAB PHASE II EXERCISE from 09/10/2023 in [place] C[facility]  Date 08/12/23  Educator hb  Instruction Review Code 1- Verbalizes Understanding       Congestive Heart Failure -Discuss the definition of CHF, how to live with CHF, the signs and symptoms of CHF, and how keep track of weight and sodium intake.   Heart Disease and Intimacy -Discus the effect sexual activity has on the heart, how changes occur during intimacy as we age, and safety during sexual activity. Flowsheet Row CARDIAC REHAB PHASE II EXERCISE from 09/10/2023 in [place] CARDIAC REHABILITATION  Date 09/09/23  Educator [facility]  Instruction Review Code 1- Verbalizes Understanding       Smoking Cessation / COPD -Discuss different methods to quit smoking, the health benefits of quitting smoking, and the definition of COPD.   Nutrition I: Fats -Discuss the types of cholesterol, what cholesterol does to the heart, and how cholesterol levels can be controlled.   Nutrition II: Labels -Discuss the different components of food labels and how to read food label   Heart Parts/Heart Disease and PAD -Discuss the anatomy of the heart, the pathway of blood circulation through the heart, and these are affected  by heart disease.   Stress I: Signs and Symptoms -Discuss the causes of stress, how stress may lead to anxiety and depression, and ways to limit stress.   Stress II: Relaxation -Discuss different types of relaxation techniques to limit stress.   Warning Signs of Stroke / TIA -Discuss definition of a stroke, what the signs and symptoms are of a stroke, and how to identify when someone is having stroke.   Knowledge Questionnaire Score:  Knowledge Questionnaire Score - 09/14/23 0931       Knowledge Questionnaire Score   Pre Score 22/24    Post Score 28/28             Core Components/Risk Factors/Patient Goals at Admission:  Personal Goals and Risk Factors at Admission - 07/30/23 1503       Core Components/Risk Factors/Patient Goals on Admission    Weight Management Weight Maintenance  Heart Failure Yes    Intervention Provide a combined exercise and nutrition program that is supplemented with education, support and counseling about heart failure. Directed toward relieving symptoms such as shortness of breath, decreased exercise tolerance, and extremity edema.    Expected Outcomes Improve functional capacity of life;Short term: Attendance in program 2-3 days a week with increased exercise capacity. Reported lower sodium intake. Reported increased fruit and vegetable intake. Reports medication compliance.;Short term: Daily weights obtained and reported for increase. Utilizing diuretic protocols set by physician.;Long term: Adoption of self-care skills and reduction of barriers for early signs and symptoms recognition and intervention leading to self-care maintenance.    Stress Yes    Intervention Offer individual and/or small group education and counseling on adjustment to heart disease, stress management and health-related lifestyle change. Teach and support self-help strategies.;Refer participants experiencing significant psychosocial distress to appropriate mental health  specialists for further evaluation and treatment. When possible, include family members and significant others in education/counseling sessions.    Expected Outcomes Short Term: Participant demonstrates changes in health-related behavior, relaxation and other stress management skills, ability to obtain effective social support, and compliance with psychotropic medications if prescribed.;Long Term: Emotional wellbeing is indicated by absence of clinically significant psychosocial distress or social isolation.             Core Components/Risk Factors/Patient Goals Review:   Goals and Risk Factor Review     Row Name 08/03/23 0945 08/26/23 0946           Core Components/Risk Factors/Patient Goals Review   Personal Goals Review Weight Management/Obesity Weight Management/Obesity      Review Jennifer King has just started with the program and is on her second visit today. She does not have any goals other then inreaseing her stamina and a good health. She lost weight due to her event and is good with where she it at. Jennifer King is doing well in the program. She is coming everyday to get back to work. She is eating healthy to help with her diet and weight management. She did lose weight due to her event and want to maintain her weight she is good with where is at      Expected Outcomes continue to have no risk factorss continue to have no risk factorss               Core Components/Risk Factors/Patient Goals at Discharge (Final Review):   Goals and Risk Factor Review - 08/26/23 0946       Core Components/Risk Factors/Patient Goals Review   Personal Goals Review Weight Management/Obesity    Review Jennifer King is doing well in the program. She is coming everyday to get back to work. She is eating healthy to help with her diet and weight management. She did lose weight due to her event and want to maintain her weight she is good with where is at    Expected Outcomes continue to have no risk factorss              ITP Comments:  ITP Comments     Row Name 07/31/23 [phone] 08/03/23 0954 08/19/23 0832 09/16/23 0816     ITP Comments First full day of exercise!  Patient was oriented to gym and equipment including functions, settings, policies, and procedures.  Patient's individual exercise prescription and treatment plan were reviewed.  All starting workloads were established based on the results of the 6 minute walk test done at initial orientation visit.  The plan for exercise  progression was also introduced and progression will be customized based on patient's performance and goals. Went over home exercise since patient has a treadmill at home to use. 30 day review completed. ITP sent to Dr. Dina Rich, Medical Director of Cardiac Rehab. Continue with ITP unless changes are made by physician.    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 30 day review completed. ITP sent to Dr. Dina Rich, Medical Director of Cardiac Rehab. Continue with ITP unless changes are made by physician.    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 She is set to graduate this week.             Comments: 30 day review

## 2023-09-17 ENCOUNTER — Encounter (HOSPITAL_COMMUNITY): Payer: 59

## 2023-09-18 ENCOUNTER — Encounter (HOSPITAL_COMMUNITY): Payer: 59

## 2023-09-21 ENCOUNTER — Encounter (HOSPITAL_COMMUNITY): Payer: 59

## 2023-09-23 ENCOUNTER — Ambulatory Visit (HOSPITAL_COMMUNITY): Payer: 59

## 2023-09-23 ENCOUNTER — Encounter (HOSPITAL_COMMUNITY): Payer: 59

## 2023-09-25 ENCOUNTER — Encounter (HOSPITAL_COMMUNITY): Payer: 59

## 2023-09-28 ENCOUNTER — Encounter (HOSPITAL_COMMUNITY): Payer: 59

## 2023-09-30 ENCOUNTER — Encounter (HOSPITAL_COMMUNITY): Payer: 59

## 2023-10-01 ENCOUNTER — Encounter (HOSPITAL_COMMUNITY): Payer: 59 | Admitting: Cardiology

## 2023-10-01 ENCOUNTER — Other Ambulatory Visit (HOSPITAL_COMMUNITY): Payer: 59

## 2023-10-02 ENCOUNTER — Encounter (HOSPITAL_COMMUNITY): Payer: 59

## 2023-10-05 ENCOUNTER — Encounter (HOSPITAL_COMMUNITY): Payer: 59

## 2023-10-06 NOTE — Progress Notes (Signed)
 ADVANCED HEART FAILURE CLINIC NOTE  Referring Physician: No ref. provider found  Primary Care: Patient, No Pcp Per Primary Cardiologist:  CC: Chronic systolic heart failure  HPI: Jennifer King is a 53 y.o. female with chronic systolic heart failure (EF 25 to 30%), moderate to severe mitral regurgitation, coronary artery disease presenting today to establish care.  Her cardiac history dates back to December 2024 when she was admitted with pneumonia.  At that time EKG with inferior Q waves.  Echocardiogram with EF of 25 to 30% and moderate to severe MR with moderate AI.  Patient underwent right and left heart cath authorization that demonstrated a high-grade mid RCA lesion with occluded right PLV and mild disease of the LAD.  Right heart cath with cardiac index of 2.7, papi of 1.4.  Patient was started on low-dose GDMT and since that time is followed up in [facility] clinic.   Activity level/exercise tolerance:  *** Orthopnea:  Sleeps on *** pillows Paroxysmal noctural dyspnea:  *** Chest pain/pressure:  *** Orthostatic lightheadedness:  *** Palpitations:  *** Lower extremity edema:  *** Presyncope/syncope:  *** Cough:  ***  Pertinent Family & social hx:    PHYSICAL EXAM: There were no vitals filed for this visit. GENERAL: NAD Lungs- *** CARDIAC:  JVP: *** cm          Normal rate with regular rhythm. *** murmur.  Pulses ***. *** edema.  ABDOMEN: Soft, non-tender, non-distended.  EXTREMITIES: Warm and well perfused.  NEUROLOGIC: No obvious FND   DATA REVIEW  ECG: 07/13/23: NSR w/ PVC; inferior Q waves  As per my personal interpretation  ECHO: 06/27/23: LVEF 25%-30%, normal RV function, moderate to severe AI, mod AI as per my personal interpretation  CATH: 06/28/24:  1.  Mild mid LAD disease. 2.  High-grade mid right coronary artery lesion that is diffusely diseased with an occluded right posterolateral ventricular branch.  Given the lack of reproducible angina in the unclear  benefit of revascularization in this setting, medical therapy will be pursued.  If the patient develops anginal symptoms that are refractory to medical therapy then PCI could be considered. 3.  Fick cardiac output of 4.4 L/min and Fick cardiac index of 2.7 L/min/m with the following hemodynamics:             Right atrial pressure mean of 15 mmHg             Right ventricular pressure 47/10 with an end-diastolic pressure of 19 mmHg             Wedge pressure mean of 24 mmHg with V waves to 31 mmHg             PA pressure 48/27 with a mean of 36 mmHg             Pulmonary vascular resistance of 2.7 Woods units             PA pulsatility index of 1.4 4.  LVEDP of 28 mmHg 5.  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  CMR: 09/07/23:  1. Subendocardial LGE in basal to apical inferior/inferolateral walls and basal inferoseptal wall consistent with prior RCA territory infarct. LGE is greater than 50% transmural, suggesting RCA territory is nonviable.  2. Mild LV dilatation with moderate systolic dysfunction (EF 34%). Thinning/akinesis of basal to apical inferior wall  3.  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  Left heart cath cath as above. NYHA class / AHA Stage: NYHA II Volume status & Diuretics: *** Vasodilators: Entresto 24/26 mg twice daily Beta-Blocker: Toprol 25 mg daily MRA: Spironolactone 25 mg daily Cardiometabolic: Discontinued previously due to yeast infection. Devices therapies & Valvulopathies:*** Advanced therapies:***  2.  Coronary artery disease -Left heart cath with CTO of posterolateral branch. -Continue Lipitor 20 mg daily  3.  Moderate to severe mitral regurgitation -Likely functional due to restricted posterior leaflet. -Repeat echo at follow-up  4.  Tobacco use -Smoked since 53 years old.  Stopped  11/24.  Discussed the importance of continued cessation. Jennifer King Advanced Heart Failure Mechanical Circulatory Support

## 2023-10-07 ENCOUNTER — Ambulatory Visit (HOSPITAL_COMMUNITY)
Admission: RE | Admit: 2023-10-07 | Discharge: 2023-10-07 | Disposition: A | Discharge status: Home / Self Care | Payer: 59 | Source: Ambulatory Visit | Attending: Cardiology | Admitting: Cardiology

## 2023-10-07 ENCOUNTER — Encounter (HOSPITAL_COMMUNITY): Payer: Self-pay | Admitting: Cardiology

## 2023-10-07 ENCOUNTER — Ambulatory Visit (HOSPITAL_BASED_OUTPATIENT_CLINIC_OR_DEPARTMENT_OTHER)
Admission: RE | Admit: 2023-10-07 | Discharge: 2023-10-07 | Disposition: A | Discharge status: Home / Self Care | Payer: 59 | Source: Ambulatory Visit | Attending: Cardiology | Admitting: Cardiology

## 2023-10-07 ENCOUNTER — Encounter (HOSPITAL_COMMUNITY): Payer: 59

## 2023-10-07 VITALS — BP 104/78 | HR 87 | Ht 63.0 in | Wt 137.4 lb

## 2023-10-07 DIAGNOSIS — I509 Heart failure, unspecified: Secondary | ICD-10-CM | POA: Diagnosis present

## 2023-10-07 DIAGNOSIS — Z006 Encounter for examination for normal comparison and control in clinical research program: Secondary | ICD-10-CM

## 2023-10-07 DIAGNOSIS — I428 Other cardiomyopathies: Secondary | ICD-10-CM | POA: Insufficient documentation

## 2023-10-07 DIAGNOSIS — I5022 Chronic systolic (congestive) heart failure: Secondary | ICD-10-CM | POA: Diagnosis not present

## 2023-10-07 DIAGNOSIS — I340 Nonrheumatic mitral (valve) insufficiency: Secondary | ICD-10-CM

## 2023-10-07 DIAGNOSIS — I2510 Atherosclerotic heart disease of native coronary artery without angina pectoris: Secondary | ICD-10-CM

## 2023-10-07 DIAGNOSIS — I351 Nonrheumatic aortic (valve) insufficiency: Secondary | ICD-10-CM | POA: Diagnosis not present

## 2023-10-07 DIAGNOSIS — Z79899 Other long term (current) drug therapy: Secondary | ICD-10-CM | POA: Diagnosis not present

## 2023-10-07 DIAGNOSIS — Z720 Tobacco use: Secondary | ICD-10-CM

## 2023-10-07 DIAGNOSIS — I5020 Unspecified systolic (congestive) heart failure: Secondary | ICD-10-CM

## 2023-10-07 DIAGNOSIS — I252 Old myocardial infarction: Secondary | ICD-10-CM | POA: Insufficient documentation

## 2023-10-07 DIAGNOSIS — Z87891 Personal history of nicotine dependence: Secondary | ICD-10-CM | POA: Diagnosis not present

## 2023-10-07 LAB — ECHOCARDIOGRAM COMPLETE
AR max vel: 1.82 cm2
AV Area VTI: 2.12 cm2
AV Area mean vel: 1.83 cm2
AV Mean grad: 4 mmHg
AV Peak grad: 7.5 mmHg
Ao pk vel: 1.37 m/s
Area-P 1/2: 4.21 cm2
Calc EF: 36.1 %
MV M vel: 5.47 m/s
MV Peak grad: 119.7 mmHg
MV VTI: 2.87 cm2
P 1/2 time: 262 ms
S' Lateral: 4.1 cm
Single Plane A2C EF: 32.3 %
Single Plane A4C EF: 38.3 %

## 2023-10-07 LAB — BASIC METABOLIC PANEL
Anion gap: 11 (ref 5–15)
BUN: 15 mg/dL (ref 6–20)
CO2: 23 mmol/L (ref 22–32)
Calcium: 10 mg/dL (ref 8.9–10.3)
Chloride: 103 mmol/L (ref 98–111)
Creatinine, Ser: 0.73 mg/dL (ref 0.44–1.00)
GFR, Estimated: >60 mL/min (ref >60)
Glucose, Bld: 97 mg/dL (ref 70–99)
Potassium: 4.6 mmol/L (ref 3.5–5.1)
Sodium: 137 mmol/L (ref 135–145)

## 2023-10-07 LAB — BRAIN NATRIURETIC PEPTIDE: B Natriuretic Peptide: 64.7 pg/mL (ref 0.0–100.0)

## 2023-10-07 MED ORDER — METOPROLOL SUCCINATE ER 50 MG PO TB24: 25.0000 mg | ORAL_TABLET | Freq: Every day | ORAL | 1 refills | Status: DC

## 2023-10-07 NOTE — Progress Notes (Signed)
  Echocardiogram 2D Echocardiogram has been performed.  Ocie Doyne RDCS 10/07/2023, 9:02 AM

## 2023-10-07 NOTE — Patient Instructions (Addendum)
 Medication Changes:  INCREASE TO 50 MG BY MOUTH DAILY   Lab Work:  Labs done today, your results will be available in MyChart, we will contact you for abnormal readings.     Follow-Up in:    1 month with pharmacy as scheduled.  6 weeks with Dr. Gasper Lloyd as scheduled.   At the Advanced Heart Failure Clinic, you and your health needs are our priority. We have a designated team specialized in the treatment of Heart Failure. This Care Team includes your primary Heart Failure Specialized Cardiologist (physician), Advanced Practice Providers (APPs- Physician Assistants and Nurse Practitioners), and Pharmacist who all work together to provide you with the care you need, when you need it.   You may see any of the following providers on your designated Care Team at your next follow up:  Dr. Arvilla Meres Dr. Marca Ancona Dr. Dorthula Nettles Dr. Theresia Bough Tonye Becket, NP Robbie Lis, [place] [facility] [place], [place] Brynda Peon, NP [place] Lee, NP Karle Plumber, PharmD   Please be sure to bring in all your medications bottles to every appointment.   Need to Contact us:  If you have any questions or concerns before your next appointment please send [place] a message through [place] or call our office at [phone].    TO LEAVE A MESSAGE FOR THE NURSE SELECT OPTION 2, PLEASE LEAVE A MESSAGE INCLUDING: YOUR NAME DATE OF BIRTH CALL BACK NUMBER REASON FOR CALL**this is important as we prioritize the call backs  YOU WILL RECEIVE A CALL BACK THE SAME DAY AS LONG AS YOU CALL BEFORE 4:00 PM

## 2023-10-07 NOTE — Research (Signed)
 SITE: [id number]     Subject # [id number]   Subprotocol: A  Inclusion Criteria  Patients who meet all of the following criteria are eligible for enrollment as study participants:  Yes No  Age > 51 years old X   Eligible to wear Holter Study X    Exclusion Criteria  Patients who meet any of these criteria are not eligible for enrollment as study participants: Yes No  1. Receiving any mechanical (respiratory or circulatory) or renal support therapy at Screening or during Visit #1.  X  2.  Any other conditions that in the opinion of the investigators are likely to prevent compliance with the study protocol or pose a safety concern if the subject participates in the study.  X  3. Poor tolerance, namely susceptible to severe skin allergies from ECG adhesive patch application.  X   Protocol: REV H                                     Residential Zip code [phone] (First 3 digits ONLY)                                             [facility] Informed Consent   Subject Name: Jennifer King  Subject met inclusion and exclusion criteria.  The informed consent form, study requirements and expectations were reviewed with the subject. Subject had opportunity to read consent and questions and concerns were addressed prior to the signing of the consent form.  The subject verbalized understanding of the trial requirements.  The subject agreed to participate in the [facility] EF ACT trial and signed the informed consent at 09:10 on 07-Oct-2023.  The informed consent was obtained prior to performance of any protocol-specific procedures for the subject.  A copy of the signed informed consent was given to the subject and a copy was placed in the subject's medical record.   Dyanne Iha          Current Outpatient Medications:    aspirin EC 81 MG tablet, Take 1 tablet (81 mg total) by mouth daily. Swallow whole., Disp: 30 tablet, Rfl: 11   atorvastatin (LIPITOR) 20 MG tablet, Take 1 tablet (20 mg total) by mouth daily.,  Disp: 90 tablet, Rfl: 3   diphenhydrAMINE (BENADRYL) 25 mg capsule, Take 25 mg by mouth as needed., Disp: , Rfl:    metoprolol succinate (TOPROL-XL) 25 MG 24 hr tablet, TAKE 1 TABLET (25 MG TOTAL) BY MOUTH DAILY., Disp: 90 tablet, Rfl: 3   MULTIPLE VITAMIN PO, Take 1 tablet by mouth daily. 1 Gummy daily, Disp: , Rfl:    sacubitril-valsartan (ENTRESTO) 24-26 MG, Take 1 tablet by mouth 2 (two) times daily., Disp: 60 tablet, Rfl: 3   spironolactone (ALDACTONE) 25 MG tablet, Take 1 tablet (25 mg total) by mouth daily., Disp: 30 tablet, Rfl: 3

## 2023-10-09 ENCOUNTER — Encounter (HOSPITAL_COMMUNITY): Payer: 59

## 2023-10-12 ENCOUNTER — Encounter (HOSPITAL_COMMUNITY): Payer: 59

## 2023-10-14 ENCOUNTER — Encounter (HOSPITAL_COMMUNITY): Payer: 59

## 2023-10-16 ENCOUNTER — Encounter (HOSPITAL_COMMUNITY): Payer: 59

## 2023-10-19 ENCOUNTER — Encounter (HOSPITAL_COMMUNITY): Payer: 59

## 2023-11-18 ENCOUNTER — Other Ambulatory Visit (HOSPITAL_COMMUNITY)

## 2023-11-23 NOTE — Progress Notes (Incomplete)
 ***In Progress***    Advanced Heart Failure Clinic Note   Referring Physician: Sheryl Donna, NP  Primary Care: Patient, No Pcp Per Primary HF Cardiologist: Alwin Baars, DO  HPI: Jennifer King is a 53 y.o. female with PMH of chronic systolic HF (EF 25 to 30%), moderate to severe mitral regurgitation, and CAD  Her cardiac history dates back to December 2024 when she was admitted with pneumonia. At that time EKG with inferior Q waves. Echocardiogram with EF of 25 to 30% and moderate to severe MR with moderate AI. Patient underwent right and left heart cath authorization that demonstrated a high-grade mid RCA lesion with occluded right PLV and mild disease of the LAD. Right heart cath with cardiac index of 2.7, papi of 1.4. Patient was started on low-dose GDMT.   She was seen for follow-up on 10/07/23 after completion of cardiac rehab. 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 She was able to walk up 2 flights of steps. Echo with persistently reduced LVEF. She reported infrequent/mild lightheadedness, but no other complaints.  Today she returns to HF clinic for pharmacist medication titration. At last visit with MD *** metoprolol  succinate was increased to 50 mg daily***not adjusted on med list***  Shortness of breath/dyspnea on exertion? {YES NO:22349}  Orthopnea/PND? {YES NO:22349} Edema? {YES NO:22349} Lightheadedness/dizziness? {YES NO:22349} Daily weights at home? {YES NO:22349} Blood pressure/heart rate monitoring at home? {YES [id number] Following low-sodium/fluid-restricted diet? {YES NO:22349}  HF Medications: Metoprolol  succinate 25***/50*** mg daily Entresto  24/26 mg BID Spironolactone  25 mg daily  Has the patient been experiencing any side effects to the medications prescribed?  {YES NO:22349}  Does the patient have any problems obtaining medications due to transportation or finances?   Insurance: [employer] -  Architectural technologist of regimen: {excellent/good/fair/poor:19665} Understanding of indications: {excellent/good/fair/poor:19665} Potential of compliance: {excellent/good/fair/poor:19665} Patient understands to avoid NSAIDs. Patient understands to avoid decongestants.    Pertinent Lab Values: 10/07/23: Serum creatinine 0.73 mg/dL, BUN 15 mg/dL, Potassium 4.6 mmol/L, Sodium 137 mmol/L, BNP 64.7 pg/mL   Vital Signs: Weight: *** (last clinic weight: 137 lbs) Blood pressure: *** (last BP 104/78 mmHg) Heart rate: *** (last HR 87 bpm)  Brief A/P No labs, order 90-day supply if pt OK If BP too soft in clinic or at home, then inc toprol  50 BID BP is soft, check BP at home? would like to try to inc Entresto   ASSESSMENT & PLAN:   Heart failure with reduced ejection fraction Etiology of HF: Mixed ischemic and nonischemic cardiomyopathy.  Left heart cath cath as above. NYHA class / AHA Stage: NYHA II*** Volume status & Diuretics: Euvolemic*** Vasodilators: ***Entresto  24/26 mg twice daily; limited by SBP Beta-Blocker: *** Metoprolol  succinate 50 mg daily. MRA: Spironolactone  25 mg daily Cardiometabolic: Discontinued previously due to yeast infection. Devices therapies & Valvulopathies: We discussed risks/benefits of primary prevention ICD. Her LVEF is 30-35% and 34% by CMR. She would like to wait another 6 weeks, increase GDMT and repeat TTE before pursuing ICD implant.  Advanced therapies:***Currently NYHA IIB symptoms    2.  Coronary artery disease -Left heart cath with CTO of posterolateral branch. -Continue atorvastatin  20 mg daily - no chest pain   3.  Moderate to severe mitral regurgitation -Likely functional due to restricted posterior leaflet.   4.  Tobacco use -Smoked since 53 years old.  Stopped 05/2023.  Discussed the importance of continued cessation.  Follow up with *** in ***  [facility]  Leighton Punches, PharmD PGY2 Cardiology Pharmacy Resident

## 2023-11-25 ENCOUNTER — Other Ambulatory Visit (HOSPITAL_COMMUNITY): Payer: Self-pay

## 2023-11-25 ENCOUNTER — Ambulatory Visit (HOSPITAL_COMMUNITY)
Admission: RE | Admit: 2023-11-25 | Discharge: 2023-11-25 | Disposition: A | Discharge status: Home / Self Care | Source: Ambulatory Visit | Attending: Internal Medicine | Admitting: Internal Medicine

## 2023-11-25 VITALS — BP 116/82 | HR 96 | Wt 142.6 lb

## 2023-11-25 DIAGNOSIS — I340 Nonrheumatic mitral (valve) insufficiency: Secondary | ICD-10-CM | POA: Insufficient documentation

## 2023-11-25 DIAGNOSIS — I5022 Chronic systolic (congestive) heart failure: Secondary | ICD-10-CM | POA: Insufficient documentation

## 2023-11-25 DIAGNOSIS — Z79899 Other long term (current) drug therapy: Secondary | ICD-10-CM | POA: Diagnosis not present

## 2023-11-25 DIAGNOSIS — Z87891 Personal history of nicotine dependence: Secondary | ICD-10-CM | POA: Insufficient documentation

## 2023-11-25 DIAGNOSIS — I428 Other cardiomyopathies: Secondary | ICD-10-CM | POA: Insufficient documentation

## 2023-11-25 DIAGNOSIS — I2510 Atherosclerotic heart disease of native coronary artery without angina pectoris: Secondary | ICD-10-CM | POA: Insufficient documentation

## 2023-11-25 DIAGNOSIS — I5020 Unspecified systolic (congestive) heart failure: Secondary | ICD-10-CM

## 2023-11-25 MED ORDER — METOPROLOL SUCCINATE ER 50 MG PO TB24: 50.0000 mg | ORAL_TABLET | Freq: Every day | ORAL | 3 refills | Status: DC

## 2023-11-25 MED ORDER — SPIRONOLACTONE 25 MG PO TABS: 25.0000 mg | ORAL_TABLET | Freq: Every day | ORAL | 3 refills | Status: DC

## 2023-11-25 MED ORDER — SACUBITRIL-VALSARTAN 24-26 MG PO TABS: 1.0000 | ORAL_TABLET | Freq: Two times a day (BID) | ORAL | 3 refills | Status: DC

## 2023-11-25 NOTE — Progress Notes (Signed)
 Advanced Heart Failure Clinic Note   Referring Physician: Sheryl Donna, NP  Primary Care: Patient, No Pcp Per Primary HF Cardiologist: Alwin Baars, DO  HPI: Jennifer King is a 53 y.o. female with PMH of chronic systolic HFrEF, moderate to severe mitral regurgitation, and CAD.    Her cardiac history dates back to December 2024 when she was admitted with pneumonia. At that time EKG with inferior Q waves. Echocardiogram with EF of 25 to 30% and moderate to severe MR with moderate AI. Patient underwent right and left heart cath that demonstrated a high-grade mid RCA lesion with occluded right PLV and mild disease of the LAD. Right heart cath with cardiac index of 2.7, papi of 1.4. Patient was started on low-dose GDMT.   She was seen for follow-up on 10/07/23 after completion of cardiac rehab. 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 She was able to walk up 2 flights of steps. Echo with persistently reduced LVEF. She reported infrequent/mild lightheadedness, but no other complaints.  Today she returns to HF clinic for pharmacist medication titration.  Today she reports feeling well overall. She reports no issues with her breathing and has had no LEE or issues with swelling. She does weigh herself at home and says that her weight has gone up gradually over the past month, but she feels like this is mostly related to lack of exercise. She states that she has not found the time to walk as much as she would like to. She reported having some periodic fatigue when starting the Entresto  that has now improved. She denies any lightheadedness and dizziness. She has her daughter check her BPs at home which have consistently been 115-120/75-80 mmHg prior to taking her medication. I asked her to try to monitor her BP after taking her morning doses of medication. At last visit with MD the patient was instructed to increase metoprolol  succinate to 50 mg  daily but an updated prescription was not sent so she had only taken the increased dose for a week and then went back to taking 25 mg daily. She reports no missed doses with her medications. She says that she has tried to remain adherent to low-sodium and fluid-restricted diet, but she will still drink ~2 bottles of diet Coke per day. I asked the patient to keep daily fluid intake under 2 L and salt intake under 2000 mg. Confirmed that the patient has a co-pay card for Entresto .   HF Medications: Metoprolol  succinate 25 mg daily Entresto  24/26 mg BID Spironolactone  25 mg daily  Has the patient been experiencing any side effects to the medications prescribed?  no  Does the patient have any problems obtaining medications due to transportation or finances?   Insurance: U[employer] - commercial. Patient has co-pay card for Entresto .  Understanding of regimen: excellent Understanding of indications: excellent Potential of compliance: excellent Patient understands to avoid NSAIDs. Patient understands to avoid decongestants.    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   NYHA class / AHA Stage: NYHA II Volume status & Diuretics: Euvolemic  Vasodilators: Continue Entresto  24/26 mg twice daily; uptitration limited by SBP Beta-Blocker: Increase metoprolol  succinate to 50 mg daily. MRA: Spironolactone  25 mg daily Cardiometabolic: Discontinued previously due to yeast infection. Devices therapies & Valvulopathies: Previously discussed risks/benefits of primary prevention ICD with Dr. Bruce Caper. Her LVEF is 30-35% and 34% by  CMR. She wanted to wait another 6 weeks, increase GDMT and repeat TTE before pursuing ICD implant. Repeat echo scheduled for 11/30/2023. Advanced therapies:Currently NYHA II symptoms    2.  Coronary artery disease -Left heart cath with CTO of posterolateral branch. -Continue atorvastatin   20 mg daily - no chest pain   3.  Moderate to severe mitral regurgitation -Likely functional due to restricted posterior leaflet.   4.  Tobacco use -Smoked since 53 years old.  Stopped 05/2023.  Discussed the importance of continued cessation.  Follow up with MD next week.  Albino Alu, PharmD PGY2 Cardiology Pharmacy Resident

## 2023-11-25 NOTE — Patient Instructions (Addendum)
 It was a pleasure seeing you today!  MEDICATIONS: -We are changing your medications today -Increase metoprolol  succinate to 50 mg daily -Call if you have questions about your medications.  LABS: -We will call you if your labs need attention.  NEXT APPOINTMENT: Return to clinic in 1 week with Dr. Bruce Caper  In general, to take care of your heart failure: -Limit your fluid intake to 2 Liters (half-gallon) per day.   -Limit your salt intake to ideally 2-3 grams (2000-3000 mg) per day. -Weigh yourself daily and record, and bring that "weight diary" to your next appointment.  (Weight gain of 2-3 pounds in 1 day typically means fluid weight.) -The medications for your heart are to help your heart and help you live longer.   -Please contact us  before stopping any of your heart medications.  Call the clinic at [phone] with questions or to reschedule future appointments.

## 2023-11-30 ENCOUNTER — Encounter (HOSPITAL_COMMUNITY): Payer: Self-pay

## 2023-11-30 ENCOUNTER — Ambulatory Visit (HOSPITAL_BASED_OUTPATIENT_CLINIC_OR_DEPARTMENT_OTHER)
Admission: RE | Admit: 2023-11-30 | Discharge: 2023-11-30 | Disposition: A | Discharge status: Home / Self Care | Source: Ambulatory Visit | Attending: Cardiology | Admitting: Cardiology

## 2023-11-30 ENCOUNTER — Ambulatory Visit (HOSPITAL_COMMUNITY)
Admission: RE | Admit: 2023-11-30 | Discharge: 2023-11-30 | Disposition: A | Discharge status: Home / Self Care | Source: Ambulatory Visit | Attending: Adult Health | Admitting: Adult Health

## 2023-11-30 VITALS — BP 119/86 | HR 60 | Wt 144.0 lb

## 2023-11-30 DIAGNOSIS — I350 Nonrheumatic aortic (valve) stenosis: Secondary | ICD-10-CM | POA: Diagnosis not present

## 2023-11-30 DIAGNOSIS — I5020 Unspecified systolic (congestive) heart failure: Secondary | ICD-10-CM | POA: Diagnosis present

## 2023-11-30 DIAGNOSIS — I2510 Atherosclerotic heart disease of native coronary artery without angina pectoris: Secondary | ICD-10-CM

## 2023-11-30 DIAGNOSIS — Z87891 Personal history of nicotine dependence: Secondary | ICD-10-CM | POA: Diagnosis not present

## 2023-11-30 DIAGNOSIS — I428 Other cardiomyopathies: Secondary | ICD-10-CM | POA: Diagnosis not present

## 2023-11-30 DIAGNOSIS — I5022 Chronic systolic (congestive) heart failure: Secondary | ICD-10-CM

## 2023-11-30 DIAGNOSIS — I340 Nonrheumatic mitral (valve) insufficiency: Secondary | ICD-10-CM

## 2023-11-30 LAB — BASIC METABOLIC PANEL WITH GFR
Anion gap: 6 (ref 5–15)
BUN: 12 mg/dL (ref 6–20)
CO2: 26 mmol/L (ref 22–32)
Calcium: 9.1 mg/dL (ref 8.9–10.3)
Chloride: 107 mmol/L (ref 98–111)
Creatinine, Ser: 0.69 mg/dL (ref 0.44–1.00)
GFR, Estimated: >60 mL/min (ref >60)
Glucose, Bld: 81 mg/dL (ref 70–99)
Potassium: 4.8 mmol/L (ref 3.5–5.1)
Sodium: 139 mmol/L (ref 135–145)

## 2023-11-30 NOTE — Patient Instructions (Signed)
  Follow-Up in:  3 MONTHS PLEASE CALL OUR OFFICE AROUND JUNE  TO GET SCHEDULED FOR YOUR APPOINTMENT. PHONE NUMBER IS [phone] OPTION 2   At the [facility] Failure Clinic, you and your health needs are our priority. We have a designated team specialized in the treatment of Heart Failure. This Care Team includes your primary Heart Failure Specialized Cardiologist (physician), Advanced Practice Providers (APPs- Physician Assistants and Nurse Practitioners), and Pharmacist who all work together to provide you with the care you need, when you need it.   You may see any of the following providers on your designated Care Team at your next follow up:  Dr. Jules Oar Dr. Peder Bourdon Dr. Alwin Baars Dr. Judyth Nunnery Nieves Bars, NP Ruddy Corral, [facility] [place], [place] Dennise Fitz, NP [place] Lee, NP Luster Salters, PharmD   Please be sure to bring in all your medications bottles to every appointment.   Need to Contact Us :  If you have any questions or concerns before your next appointment please send us  a message through [place] or call our office at [phone].    TO LEAVE A MESSAGE FOR THE NURSE SELECT OPTION 2, PLEASE LEAVE A MESSAGE INCLUDING: YOUR NAME DATE OF BIRTH CALL BACK NUMBER REASON FOR CALL**this is important as we prioritize the call backs  YOU WILL RECEIVE A CALL BACK THE SAME DAY AS LONG AS YOU CALL BEFORE 4:00 PM

## 2023-11-30 NOTE — Progress Notes (Signed)
 ADVANCED HEART FAILURE CLINIC NOTE  Referring Physician: Alwin Baars, DO  Primary Care: Patient, No Pcp Per Primary Cardiologist:  CC: Chronic systolic heart failure  HPI: Jennifer King is a 53 y.o. female with chronic systolic heart failure (EF 25 to 30%), moderate to severe mitral regurgitation and coronary artery disease. Her cardiac history dates back to December 2024 when she was admitted with pneumonia.  At that time EKG with inferior Q waves.  Echocardiogram with EF of 25 to 30% and moderate to severe MR with moderate AI.  Patient underwent right and left heart cath authorization that demonstrated a high-grade mid RCA lesion with occluded right PLV and mild disease of the LAD.  Right heart cath with cardiac index of 2.7, papi of 1.4.  Patient was started on low-dose GDMT and since that time is followed up in [facility] clinic.  She presents today with her daughter. Overall feeling fine. Able to do whatever she wants. Denies SOB/PND/Orthopnea. No chest pain. SBP at home 120. Appetite ok. No fever or chills. Weight at home  stable. Taking all medications. Back working full time as Lawyer.   Pertinent Family & social hx:  Grandfather: [place] Works as Lawyer.  Stopped smoking 11/24  PHYSICAL EXAM: Vitals:   11/30/23 0947  BP: 119/86  Pulse: 60  SpO2: 97%   General:   No resp difficulty Neck: supple. no JVD.  Cor: PMI nondisplaced. Regular rate & rhythm. No rubs, gallops or murmurs. Lungs: clear Abdomen: soft, nontender, nondistended.  Extremities: no cyanosis, clubbing, rash, edema Neuro: alert & oriented x3  DATA REVIEW  ECG: 07/13/23: NSR w/ PVC; inferior Q waves  As per my personal interpretation  ECHO: 06/27/23: LVEF 25%-30%, normal RV function, moderate to severe MR, mod AI as per my personal interpretation 10/07/23: LVEF 30-35%; normal RV function. Trace MR; mild AI.   CATH: 06/28/24:  1.  Mild mid LAD disease. 2.  High-grade mid right coronary  artery lesion that is diffusely diseased with an occluded right posterolateral ventricular branch.  Given the lack of reproducible angina in the unclear benefit of revascularization in this setting, medical therapy will be pursued.  If the patient develops anginal symptoms that are refractory to medical therapy then PCI could be considered. 3.  Fick cardiac output of 4.4 L/min and Fick cardiac index of 2.7 L/min/m with the following hemodynamics:             Right atrial pressure mean of 15 mmHg             Right ventricular pressure 47/10 with an end-diastolic pressure of 19 mmHg             Wedge pressure mean of 24 mmHg with V waves to 31 mmHg             PA pressure 48/27 with a mean of 36 mmHg             Pulmonary vascular resistance of 2.7 Woods units             PA pulsatility index of 1.4 4.  LVEDP of 28 mmHg 5.  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  CMR: 09/07/23:  1. Subendocardial LGE in basal to apical inferior/inferolateral walls and basal inferoseptal wall consistent with prior RCA territory infarct. LGE is greater than 50% transmural, suggesting RCA territory is nonviable.  2. Mild LV dilatation with moderate systolic dysfunction (EF 34%). Thinning/akinesis of basal to apical inferior wall  3.  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  Left heart cath cath as above. NYHA class / AHA Stage: NYHA I.  Volume status & Diuretics: Euvolemic Vasodilators: Entresto  24/26 mg twice daily Beta-Blocker: Toprol   50 mg daily.  MRA: Spironolactone  25 mg daily Cardiometabolic: Discontinued previously due to yeast infection. Devices therapies & Valvulopathies: We discussed risks/benefits of primary prevention ICD. Her LVEF is 30-35% and 34% by CMR.  Echo - Next month at [facility]. Of note Echo cancelled today and rescheduled at [facility].   Advanced therapies:  Waiting on repeat Echo BMET today.   2.  Coronary artery disease -Left heart cath with CTO of posterolateral branch. -Continue Lipitor 20 mg daily - No chest pain.   3.  Moderate to severe mitral regurgitation -Likely functional due to restricted posterior leaflet. Repeat Echo pending.   4.  Tobacco use -Smoked since 53 years old.  Stopped 11/24.    Follow up in 3 months with Dr Bruce Caper.   Egbert Grass Vernia Teem NP-C  10:14 AM

## 2023-12-02 ENCOUNTER — Other Ambulatory Visit (HOSPITAL_COMMUNITY): Payer: Self-pay | Admitting: Family Medicine

## 2023-12-30 ENCOUNTER — Ambulatory Visit (HOSPITAL_COMMUNITY)
Admission: RE | Admit: 2023-12-30 | Discharge: 2023-12-30 | Disposition: A | Discharge status: Home / Self Care | Source: Ambulatory Visit | Attending: Cardiology | Admitting: Cardiology

## 2023-12-30 DIAGNOSIS — I5020 Unspecified systolic (congestive) heart failure: Secondary | ICD-10-CM | POA: Insufficient documentation

## 2023-12-30 DIAGNOSIS — I5022 Chronic systolic (congestive) heart failure: Secondary | ICD-10-CM

## 2023-12-30 LAB — ECHOCARDIOGRAM COMPLETE
Area-P 1/2: 3.08 cm2
P 1/2 time: 485 ms
S' Lateral: 3.7 cm

## 2023-12-30 NOTE — Progress Notes (Signed)
*  PRELIMINARY RESULTS* Echocardiogram 2D Echocardiogram has been performed.  Jennifer King 12/30/2023, 10:37 AM

## 2024-03-15 ENCOUNTER — Ambulatory Visit (HOSPITAL_COMMUNITY)
Admission: RE | Admit: 2024-03-15 | Discharge: 2024-03-15 | Disposition: A | Discharge status: Home / Self Care | Source: Ambulatory Visit | Attending: Cardiology | Admitting: Cardiology

## 2024-03-15 ENCOUNTER — Encounter (HOSPITAL_COMMUNITY): Payer: Self-pay | Admitting: Cardiology

## 2024-03-15 VITALS — BP 120/78 | HR 74 | Ht 63.0 in | Wt 142.8 lb

## 2024-03-15 DIAGNOSIS — I340 Nonrheumatic mitral (valve) insufficiency: Secondary | ICD-10-CM | POA: Diagnosis not present

## 2024-03-15 DIAGNOSIS — I5020 Unspecified systolic (congestive) heart failure: Secondary | ICD-10-CM

## 2024-03-15 DIAGNOSIS — I214 Non-ST elevation (NSTEMI) myocardial infarction: Secondary | ICD-10-CM | POA: Diagnosis present

## 2024-03-15 DIAGNOSIS — Z720 Tobacco use: Secondary | ICD-10-CM | POA: Diagnosis not present

## 2024-03-15 LAB — BRAIN NATRIURETIC PEPTIDE: B Natriuretic Peptide: 75.5 pg/mL (ref 0.0–100.0)

## 2024-03-15 LAB — BASIC METABOLIC PANEL WITH GFR
Anion gap: 8 (ref 5–15)
BUN: 13 mg/dL (ref 6–20)
CO2: 23 mmol/L (ref 22–32)
Calcium: 9.4 mg/dL (ref 8.9–10.3)
Chloride: 108 mmol/L (ref 98–111)
Creatinine, Ser: 0.75 mg/dL (ref 0.44–1.00)
GFR, Estimated: >60 mL/min (ref >60)
Glucose, Bld: 91 mg/dL (ref 70–99)
Potassium: 3.8 mmol/L (ref 3.5–5.1)
Sodium: 139 mmol/L (ref 135–145)

## 2024-03-15 MED ORDER — SACUBITRIL-VALSARTAN 49-51 MG PO TABS: 1.0000 | ORAL_TABLET | Freq: Two times a day (BID) | ORAL | 3 refills | Status: DC

## 2024-03-15 NOTE — Progress Notes (Signed)
 ADVANCED HEART FAILURE CLINIC NOTE  Referring Physician: No ref. provider found  Primary Care: Patient, No Pcp Per Primary Cardiologist:  CC: Chronic systolic heart failure  HPI: Jennifer King is a 53 y.o. female with chronic systolic heart failure (EF 25 to 30%), moderate to severe mitral regurgitation and coronary artery disease. Her cardiac history dates back to December 2024 when she was admitted with pneumonia.  At that time EKG with inferior Q waves.  Echocardiogram with EF of 25 to 30% and moderate to severe MR with moderate AI.  Patient underwent right and left heart cath authorization that demonstrated a high-grade mid RCA lesion with occluded right PLV and mild disease of the LAD.  Right heart cath with cardiac index of 2.7, papi of 1.4.  Patient was started on low-dose GDMT and since that time is followed up in [facility] clinic.  Interval hx:  - Improvement in EF to 40-45%; functionally she has been doing very well. She has minimal to DOE, CP, lightheadedness. Compliant with all medications.    Pertinent Family & social hx:  Grandfather: [place] Works as Lawyer.  Stopped smoking 11/24  PHYSICAL EXAM: Vitals:   03/15/24 1531  BP: 120/78  Pulse: 74  SpO2: 98%   GENERAL: NAD Lungs- CTA CARDIAC:  JVP: 7 cm          Normal rate with regular rhythm. no murmur.  Pulses 2+. no edema.  ABDOMEN: Soft, non-tender, non-distended.  EXTREMITIES: Warm and well perfused.  NEUROLOGIC: No obvious FND  DATA REVIEW  ECG: 07/13/23: NSR w/ PVC; inferior Q waves  As per my personal interpretation  ECHO: 06/27/23: LVEF 25%-30%, normal RV function, moderate to severe MR, mod AI as per my personal interpretation 10/07/23: LVEF 30-35%; normal RV function. Trace MR; mild AI.  12/30/23: LVEF 40-45%; normal RV function.   CATH: 06/28/24:  1.  Mild mid LAD disease. 2.  High-grade mid right coronary artery lesion that is diffusely diseased with an occluded right posterolateral ventricular  branch.  Given the lack of reproducible angina in the unclear benefit of revascularization in this setting, medical therapy will be pursued.  If the patient develops anginal symptoms that are refractory to medical therapy then PCI could be considered. 3.  Fick cardiac output of 4.4 L/min and Fick cardiac index of 2.7 L/min/m with the following hemodynamics:             Right atrial pressure mean of 15 mmHg             Right ventricular pressure 47/10 with an end-diastolic pressure of 19 mmHg             Wedge pressure mean of 24 mmHg with V waves to 31 mmHg             PA pressure 48/27 with a mean of 36 mmHg             Pulmonary vascular resistance of 2.7 Woods units             PA pulsatility index of 1.4 4.  LVEDP of 28 mmHg 5.  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  CMR: 09/07/23:  1. Subendocardial LGE in basal to apical inferior/inferolateral walls and basal inferoseptal wall consistent with prior RCA territory infarct. LGE is greater than 50% transmural, suggesting RCA territory is nonviable.  2. Mild LV dilatation with moderate systolic dysfunction (EF 34%). Thinning/akinesis of basal to apical inferior wall  3.  Normal RV  size and systolic function (EF 52%)  ASSESSMENT & PLAN:  Heart failure with reduced ejection fraction Etiology of HF: Mixed ischemic and nonischemic cardiomyopathy.  Left heart cath cath as above. NYHA class / AHA Stage: NYHA I.  Volume status & Diuretics:  euvolemic; has not required diuretics.  Vasodilators: Entresto  24/26 mg twice daily; increase Entresto  49/51mg  BID.  Beta-Blocker: Toprol   50 mg daily.  MRA: continue spironolactone  25mg  daily.  Cardiometabolic: Discontinued previously due to yeast infection. Devices therapies & Valvulopathies: Improvement in LVEF to 40-45%.  Advanced therapies:  Not indicated  2.  Coronary artery disease -Left heart cath with CTO of posterolateral branch. -Continue Lipitor 20 mg daily - No  chest pain.   3.  Moderate to severe mitral regurgitation - Reviewed TTE from 12/30/23; improvement in MR after starting GDMT.   4.  Tobacco use -Smoked since 53 years old.  Stopped 11/24.   - discussed importance of continued cessatin.   Labs reviewed: Serum creatinine 0.75, BNP 75, K3.8 (03/15/2024). All WNL.   Ronak Duquette 10:53 AM  I spent 35 minutes caring for this patient today including face to face time, ordering and reviewing labs, reviewing echo with her, seeing the patient, documenting in the record, and arranging follow ups.

## 2024-03-15 NOTE — Patient Instructions (Addendum)
 Medication Changes:  INCREASE ENTRESTO  TO 49/51MG  TWICE DAILY   CAN TAKE (2) TABLETS OF YOUR 24/26MG  UNTIL YOU RUN OUT--NEW DOSAGE SENT TO PHARMACY   Lab Work:  Labs done today, your results will be available in MyChart, we will contact you for abnormal readings.  Follow-Up in: 2 months with pharmacy as scheduled   Then again in 4 months with Dr. Gardenia PLEASE CALL OUR OFFICE AROUND OCTOBER TO GET SCHEDULED FOR YOUR APPOINTMENT. PHONE NUMBER IS [phone] OPTION 2   At the Advanced Heart Failure Clinic, you and your health needs are our priority. We have a designated team specialized in the treatment of Heart Failure. This Care Team includes your primary Heart Failure Specialized Cardiologist (physician), Advanced Practice Providers (APPs- Physician Assistants and Nurse Practitioners), and Pharmacist who all work together to provide you with the care you need, when you need it.   You may see any of the following providers on your designated Care Team at your next follow up:  Dr. Toribio Fuel Dr. Ezra Shuck Dr. Ria Gardenia Dr. Odis Brownie Greig Mosses, NP Caffie Shed, [place] [facility] [place], [place] Beckey Coe, NP [place] Lee, NP Tinnie Redman, PharmD   Please be sure to bring in all your medications bottles to every appointment.   Need to Contact Us :  If you have any questions or concerns before your next appointment please send us  a message through [place] or call our office at [phone].    TO LEAVE A MESSAGE FOR THE NURSE SELECT OPTION 2, PLEASE LEAVE A MESSAGE INCLUDING: YOUR NAME DATE OF BIRTH CALL BACK NUMBER REASON FOR CALL**this is important as we prioritize the call backs  YOU WILL RECEIVE A CALL BACK THE SAME DAY AS LONG AS YOU CALL BEFORE 4:00 PM

## 2024-04-13 NOTE — Progress Notes (Incomplete)
***  In Progress***   Advanced Heart Failure Clinic Note   Referring Physician: Hayes Beckey CROME, NP  Primary Care: Patient, No Pcp Per Primary HF Cardiologist: Gardenia Led, DO  HPI: Jennifer King is a 53 y.o. female with PMH of chronic systolic HFrEF, moderate to severe mitral regurgitation, and CAD.    Her cardiac history dates back to December 2024 when she was admitted with pneumonia. At that time EKG with inferior Q waves. Echocardiogram with EF of 25 to 30% and moderate to severe MR with moderate AI. Patient underwent right and left heart cath that demonstrated a high-grade mid RCA lesion with occluded right PLV and mild disease of the LAD. Right heart cath with cardiac index of 2.7, papi of 1.4. Patient was started on low-dose GDMT.   She was seen for follow-up on 03/15/2024. Had improvement in EF to 40-45%; functionally she had been doing very well. She had minimal to DOE, CP, lightheadedness. Compliant with all medications.    Today she returns to HF clinic for pharmacist medication titration.  At last visit with MD, Entresto  was increased to 49/51 mg BID.   HF Medications: Metoprolol  succinate 25 mg daily Entresto  49/51 mg BID Spironolactone  25 mg daily  Has the patient been experiencing any side effects to the medications prescribed?  no  Does the patient have any problems obtaining medications due to transportation or finances?   Insurance: UHC - commercial. Patient has co-pay card for Entresto . ***  Understanding of regimen: excellent Understanding of indications: excellent Potential of compliance: excellent Patient understands to avoid NSAIDs. Patient understands to avoid decongestants.    Pertinent Lab Values: 10/07/23: Serum creatinine 0.73 mg/dL, BUN 15 mg/dL, Potassium 4.6 mmol/L, Sodium 137 mmol/L, BNP 64.7 pg/mL   Vital Signs: Weight: 142 lbs (last clinic weight: 137 lbs) Blood pressure: 117/82 mmHg [patient had not taken AM doses of medications today]  Heart  rate: 96 bpm   ASSESSMENT & PLAN:   Heart failure with reduced ejection fraction Etiology of HF: Mixed ischemic and nonischemic cardiomyopathy.   NYHA class / AHA Stage: NYHA II*** Volume status & Diuretics: Euvolemic, has not required diuretics Vasodilators: Continue Entresto  49/51 mg BID Beta-Blocker: Continue metoprolol  succinate 50 mg daily. MRA: Continue Spironolactone  25 mg daily Cardiometabolic: Discontinued previously due to yeast infection. Devices therapies & Valvulopathies: Improvement in LVEF to 40-45%.  Advanced therapies:Not indicated   2.  Coronary artery disease -Left heart cath with CTO of posterolateral branch. -Continue atorvastatin   20 mg daily - no chest pain   3.  Moderate to severe mitral regurgitation - Reviewed TTE from 12/30/23; improvement in MR after starting GDMT.    4.  Tobacco use -Smoked since 53 years old.  Stopped 05/2023.  Discussed the importance of continued cessation.  Follow up ***  Jennifer King, PharmD, BCPS, BCCP, CPP Heart Failure Clinic Pharmacist [phone]

## 2024-04-27 ENCOUNTER — Other Ambulatory Visit (HOSPITAL_COMMUNITY)

## 2024-05-16 ENCOUNTER — Ambulatory Visit (HOSPITAL_COMMUNITY)
Admission: RE | Admit: 2024-05-16 | Discharge: 2024-05-16 | Disposition: A | Discharge status: Home / Self Care | Source: Ambulatory Visit | Attending: Cardiology | Admitting: Cardiology

## 2024-05-16 VITALS — BP 110/78 | HR 80 | Wt 147.0 lb

## 2024-05-16 DIAGNOSIS — I340 Nonrheumatic mitral (valve) insufficiency: Secondary | ICD-10-CM | POA: Insufficient documentation

## 2024-05-16 DIAGNOSIS — I2510 Atherosclerotic heart disease of native coronary artery without angina pectoris: Secondary | ICD-10-CM | POA: Diagnosis present

## 2024-05-16 DIAGNOSIS — I428 Other cardiomyopathies: Secondary | ICD-10-CM | POA: Diagnosis not present

## 2024-05-16 DIAGNOSIS — I5022 Chronic systolic (congestive) heart failure: Secondary | ICD-10-CM | POA: Diagnosis present

## 2024-05-16 DIAGNOSIS — I255 Ischemic cardiomyopathy: Secondary | ICD-10-CM | POA: Insufficient documentation

## 2024-05-16 DIAGNOSIS — Z87891 Personal history of nicotine dependence: Secondary | ICD-10-CM | POA: Insufficient documentation

## 2024-05-16 LAB — BASIC METABOLIC PANEL WITH GFR
Anion gap: 10 (ref 5–15)
BUN: 11 mg/dL (ref 6–20)
CO2: 26 mmol/L (ref 22–32)
Calcium: 9.5 mg/dL (ref 8.9–10.3)
Chloride: 105 mmol/L (ref 98–111)
Creatinine, Ser: 0.82 mg/dL (ref 0.44–1.00)
GFR, Estimated: >60 mL/min (ref >60)
Glucose, Bld: 91 mg/dL (ref 70–99)
Potassium: 4.5 mmol/L (ref 3.5–5.1)
Sodium: 141 mmol/L (ref 135–145)

## 2024-05-16 MED ORDER — SPIRONOLACTONE 25 MG PO TABS: 25.0000 mg | ORAL_TABLET | Freq: Every day | ORAL | 3 refills | Status: AC

## 2024-05-16 MED ORDER — METOPROLOL SUCCINATE ER 50 MG PO TB24: 75.0000 mg | ORAL_TABLET | Freq: Every day | ORAL | 3 refills | Status: DC

## 2024-05-16 NOTE — Progress Notes (Signed)
 Advanced Heart Failure Clinic Note   Referring Physician: No ref. provider found  Primary Care: Patient, No Pcp Per Primary HF Cardiologist: Unassigned  HPI:  Jennifer King is a 53 y.o. female with chronic systolic heart failure (EF 25 to 30%), moderate to severe mitral regurgitation and coronary artery disease. Her cardiac history dates back to December 2024 when she was admitted with pneumonia.  At that time EKG with inferior Q waves.  Echocardiogram with EF of 25 to 30% and moderate to severe MR with moderate AI. Patient underwent right and left heart cath authorization that demonstrated a high-grade mid RCA lesion with occluded right PLV and mild disease of the LAD.  Right heart cath with cardiac index of 2.7, papi of 1.4.  Patient was started on low-dose GDMT and since that time has followed up in [facility] clinic.   Patient presented to AHF clinic on 03/15/24 for follow-up with MD. Noted improvement in EF to 40-45%. Patient was doing very well functionally. She has minimal DOE, CP, lightheadedness. Patient reported compliance to all of her medications.   Today he returns to HF clinic for pharmacist medication titration. At last visit with MD, Entresto  was increased to 49/51 mg BID. Patient overall doing well. Primary complaint is menopause symptoms, including hot flashes and fatigue. Denies dizziness and lightheadedness. Reports SBP ~120s at home prior to taking medications. Denies chest pain or palpitations, reports having palpitations when on lower doses of metoprolol  in the past. Denies SOB, goes on daily walks outside with her son. Weight stable at home ~145 lbs. Denies PND or orthopnea. No LEE noted. Reports following a low-sodium diet. Patient reports adherence to all medications, reports taking her medications this morning.   HF Medications: Metoprolol  succinate 50 mg daily Entresto  49/51 mg BID Spironolactone  25 mg daily  Has the patient been experiencing any side effects to the  medications prescribed?  None reported  Does the patient have any problems obtaining medications due to transportation or finances?   No, patient has [employer] of regimen: good Understanding of indications: good Potential of compliance: good Patient understands to avoid NSAIDs. Patient understands to avoid decongestants.   Pertinent Lab Values: (03/15/24) Serum creatinine 0.75 mg/dL, BUN 13 mg/dL, Potassium 3.8 mmol/L, Sodium 139 mmol/L, BNP 75.5 pg/mL  Vital Signs: Weight: 147 (last clinic weight: 142 lbs) Blood pressure: 110/78 mmHg (last clinic BP 120/78 mmHg) Heart rate: 80 bpm  Assessment/Plan: Heart failure with reduced ejection fraction Etiology of HF: Mixed ischemic and nonischemic cardiomyopathy.  Left heart cath cath as above. NYHA class / AHA Stage: NYHA I.  Volume status & Diuretics:  Euvolemic; has not required diuretics.  Beta-Blocker: Increase metoprolol  succinate 75 mg daily. Instructed patient to decrease dose back to 50 mg if she experiences side effects with dose increase, including dizziness and hypotension given tolerability issues in the past.  Vasodilators: Continue Entresto  49/51mg  BID. Recheck BMET today.  MRA: Continue spironolactone  25mg  daily. Updated instructions on medication list.  Cardiometabolic: Discontinued previously due to yeast infection Devices therapies & Valvulopathies: Improvement in LVEF to 40-45%.  Advanced therapies:  Not indicated   2.  Coronary artery disease - Left heart cath with CTO of posterolateral branch. - Continue atorvastatin  20 mg daily - No chest pain.    3.  Moderate to severe mitral regurgitation - Reviewed TTE from 12/30/23; improvement in MR after starting GDMT.    4.  Tobacco use - Smoked since 53 years old.  Stopped  05/2023.   - Discussed importance of continued cessatin.   Follow up in 2 months with APP  Morna Breach, PharmD PGY2 Cardiology Pharmacy Resident   Please do  not hesitate to reach out with questions or concerns,  Jaun Bash, PharmD, CPP, BCPS, [facility] Heart Failure Pharmacist  Phone - [phone] 05/16/2024 4:05 PM

## 2024-05-16 NOTE — Patient Instructions (Signed)
 It was a pleasure seeing you today!  MEDICATIONS: -We are changing your medications today -Increase metoprolol  to 75 mg (1.5 tablets) every day -Call if you have questions about your medications.  LABS: -We will call you if your labs need attention.  NEXT APPOINTMENT: Return to clinic in 2 months for follow-up with NP/PA.  In general, to take care of your heart failure: -Limit your fluid intake to 2 Liters (half-gallon) per day.   -Limit your salt intake to ideally 2-3 grams (2000-3000 mg) per day. -Weigh yourself daily and record, and bring that weight diary to your next appointment.  (Weight gain of 2-3 pounds in 1 day typically means fluid weight.) -The medications for your heart are to help your heart and help you live longer.   -Please contact us  before stopping any of your heart medications.  Call the clinic at [phone] with questions or to reschedule future appointments.

## 2024-05-17 ENCOUNTER — Other Ambulatory Visit (HOSPITAL_COMMUNITY): Payer: Self-pay | Admitting: Women's Health

## 2024-05-17 DIAGNOSIS — Z1231 Encounter for screening mammogram for malignant neoplasm of breast: Secondary | ICD-10-CM

## 2024-05-20 ENCOUNTER — Ambulatory Visit (HOSPITAL_COMMUNITY)
Admission: RE | Admit: 2024-05-20 | Discharge: 2024-05-20 | Disposition: A | Discharge status: Home / Self Care | Source: Ambulatory Visit | Attending: Women's Health | Admitting: Women's Health

## 2024-05-20 ENCOUNTER — Encounter (HOSPITAL_COMMUNITY): Payer: Self-pay

## 2024-05-20 DIAGNOSIS — Z1231 Encounter for screening mammogram for malignant neoplasm of breast: Secondary | ICD-10-CM | POA: Diagnosis present

## 2024-05-24 ENCOUNTER — Inpatient Hospital Stay
Admission: RE | Admit: 2024-05-24 | Discharge: 2024-05-24 | Disposition: A | Discharge status: Home / Self Care | Payer: Self-pay | Source: Ambulatory Visit | Attending: Women's Health | Admitting: Women's Health

## 2024-05-24 ENCOUNTER — Other Ambulatory Visit (HOSPITAL_COMMUNITY): Payer: Self-pay | Admitting: Women's Health

## 2024-05-24 ENCOUNTER — Inpatient Hospital Stay
Admission: RE | Admit: 2024-05-24 | Discharge: 2024-05-24 | Disposition: A | Payer: Self-pay | Source: Ambulatory Visit | Attending: Women's Health | Admitting: Women's Health

## 2024-05-24 DIAGNOSIS — Z1231 Encounter for screening mammogram for malignant neoplasm of breast: Secondary | ICD-10-CM

## 2024-07-04 ENCOUNTER — Telehealth (HOSPITAL_COMMUNITY): Payer: Self-pay

## 2024-07-04 NOTE — Telephone Encounter (Signed)
 Called to confirm/remind patient of their appointment at the Advanced Heart Failure Clinic on 07/05/24.   Appointment:   [] Confirmed  [x] Left mess   [] No answer/No voice mail  [] VM Full/unable to leave message  [] Phone not in service  And to bring all medications and/or complete list.

## 2024-07-04 NOTE — Progress Notes (Signed)
 ADVANCED HEART FAILURE CLINIC NOTE  Referring Physician: No ref. provider found  Primary Care: Patient, No Pcp Per Primary Cardiologist:  CC: Chronic systolic heart failure  HPI: Jennifer King is a 53 y.o. female with chronic systolic heart failure (EF 25 to 30%), moderate to severe mitral regurgitation and coronary artery disease. Her cardiac history dates back to December 2024 when she was admitted with pneumonia.  At that time EKG with inferior Q waves.  Echocardiogram with EF of 25 to 30% and moderate to severe MR with moderate AI.  Patient underwent right and left heart cath authorization that demonstrated a high-grade mid RCA lesion with occluded right PLV and mild disease of the LAD.  Right heart cath with cardiac index of 2.7, papi of 1.4.  Patient was started on low-dose GDMT and since that time is followed up in [facility] clinic.  Interval hx:  - Improvement in EF to 40-45%; functionally she has been doing very well. She has minimal to DOE, CP, lightheadedness. Compliant with all medications.    Pertinent Family & social hx:  Grandfather: [place] Works as lawyer.  Stopped smoking 11/24  PHYSICAL EXAM: There were no vitals filed for this visit.  GENERAL: NAD Lungs- CTA CARDIAC:  JVP: 7 cm          Normal rate with regular rhythm. no murmur.  Pulses 2+. no edema.  ABDOMEN: Soft, non-tender, non-distended.  EXTREMITIES: Warm and well perfused.  NEUROLOGIC: No obvious FND  DATA REVIEW  ECG: 07/13/23: NSR w/ PVC; inferior Q waves  As per my personal interpretation  ECHO: 06/27/23: LVEF 25%-30%, normal RV function, moderate to severe MR, mod AI as per my personal interpretation 10/07/23: LVEF 30-35%; normal RV function. Trace MR; mild AI.  12/30/23: LVEF 40-45%; normal RV function.   CATH: 06/28/24:  1.  Mild mid LAD disease. 2.  High-grade mid right coronary artery lesion that is diffusely diseased with an occluded right posterolateral ventricular branch.  Given the  lack of reproducible angina in the unclear benefit of revascularization in this setting, medical therapy will be pursued.  If the patient develops anginal symptoms that are refractory to medical therapy then PCI could be considered. 3.  Fick cardiac output of 4.4 L/min and Fick cardiac index of 2.7 L/min/m with the following hemodynamics:             Right atrial pressure mean of 15 mmHg             Right ventricular pressure 47/10 with an end-diastolic pressure of 19 mmHg             Wedge pressure mean of 24 mmHg with V waves to 31 mmHg             PA pressure 48/27 with a mean of 36 mmHg             Pulmonary vascular resistance of 2.7 Woods units             PA pulsatility index of 1.4 4.  LVEDP of 28 mmHg 5.  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  CMR: 09/07/23:  1. Subendocardial LGE in basal to apical inferior/inferolateral walls and basal inferoseptal wall consistent with prior RCA territory infarct. LGE is greater than 50% transmural, suggesting RCA territory is nonviable.  2. Mild LV dilatation with moderate systolic dysfunction (EF 34%). Thinning/akinesis of basal to apical inferior wall  3.  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  Left heart cath cath as above. NYHA class / AHA Stage: NYHA I.  Volume status & Diuretics:  euvolemic; has not required diuretics.  Vasodilators: Entresto  24/26 mg twice daily; increase Entresto  49/51mg  BID.  Beta-Blocker: Toprol   50 mg daily.  MRA: continue spironolactone  25mg  daily.  Cardiometabolic: Discontinued previously due to yeast infection. Devices therapies & Valvulopathies: Improvement in LVEF to 40-45%.  Advanced therapies:  Not indicated  2.  Coronary artery disease -Left heart cath with CTO of posterolateral branch. -Continue Lipitor 20 mg daily - No chest pain.   3.   Moderate to severe mitral regurgitation - Reviewed TTE from 12/30/23; improvement in MR after starting GDMT.   4.  Tobacco use -Smoked since 53 years old.  Stopped 11/24.   - discussed importance of continued cessatin.   Labs reviewed: Serum creatinine 0.75, BNP 75, K3.8 (03/15/2024). All WNL.   Aditya Sabharwal 3:37 PM  I spent 35 minutes caring for this patient today including face to face time, ordering and reviewing labs, reviewing echo with her, seeing the patient, documenting in the record, and arranging follow ups.

## 2024-07-05 ENCOUNTER — Ambulatory Visit (HOSPITAL_COMMUNITY)
Admission: RE | Admit: 2024-07-05 | Discharge: 2024-07-05 | Disposition: A | Source: Ambulatory Visit | Attending: Family Medicine

## 2024-07-05 ENCOUNTER — Ambulatory Visit (HOSPITAL_COMMUNITY): Payer: Self-pay | Admitting: Family Medicine

## 2024-07-05 ENCOUNTER — Encounter (HOSPITAL_COMMUNITY): Payer: Self-pay

## 2024-07-05 VITALS — BP 96/58 | HR 81 | Ht 63.0 in | Wt 152.2 lb

## 2024-07-05 DIAGNOSIS — Z87891 Personal history of nicotine dependence: Secondary | ICD-10-CM | POA: Insufficient documentation

## 2024-07-05 DIAGNOSIS — I2510 Atherosclerotic heart disease of native coronary artery without angina pectoris: Secondary | ICD-10-CM

## 2024-07-05 DIAGNOSIS — Z79899 Other long term (current) drug therapy: Secondary | ICD-10-CM | POA: Insufficient documentation

## 2024-07-05 DIAGNOSIS — I255 Ischemic cardiomyopathy: Secondary | ICD-10-CM | POA: Insufficient documentation

## 2024-07-05 DIAGNOSIS — I428 Other cardiomyopathies: Secondary | ICD-10-CM | POA: Insufficient documentation

## 2024-07-05 DIAGNOSIS — I340 Nonrheumatic mitral (valve) insufficiency: Secondary | ICD-10-CM | POA: Insufficient documentation

## 2024-07-05 DIAGNOSIS — I5022 Chronic systolic (congestive) heart failure: Secondary | ICD-10-CM

## 2024-07-05 LAB — BASIC METABOLIC PANEL WITH GFR
Anion gap: 13 (ref 5–15)
BUN: 20 mg/dL (ref 6–20)
CO2: 21 mmol/L — ABNORMAL LOW (ref 22–32)
Calcium: 9.8 mg/dL (ref 8.9–10.3)
Chloride: 105 mmol/L (ref 98–111)
Creatinine, Ser: 0.74 mg/dL (ref 0.44–1.00)
GFR, Estimated: >60 mL/min (ref >60)
Glucose, Bld: 77 mg/dL (ref 70–99)
Potassium: 4.1 mmol/L (ref 3.5–5.1)
Sodium: 139 mmol/L (ref 135–145)

## 2024-07-05 MED ORDER — SACUBITRIL-VALSARTAN 24-26 MG PO TABS: 1.0000 | ORAL_TABLET | Freq: Two times a day (BID) | ORAL | 3 refills | Status: AC

## 2024-07-05 MED ORDER — METOPROLOL SUCCINATE ER 50 MG PO TB24: 75.0000 mg | ORAL_TABLET | Freq: Every evening | ORAL | 3 refills | Status: AC

## 2024-07-05 NOTE — Patient Instructions (Addendum)
 Good to see you today!  DECREASE entresto  to 24/26 mg Twice daily  CHANGE toprol   to every night  Labs done today, your results will be available in MyChart, we will contact you for abnormal readings.  Your physician has requested that you have an echocardiogram. Echocardiography is a painless test that uses sound waves to create images of your heart. It provides your doctor with information about the size and shape of your heart and how well your hearts chambers and valves are working. This procedure takes approximately one hour. There are no restrictions for this procedure. Please do NOT wear cologne, perfume, aftershave, or lotions (deodorant is allowed). Please arrive 15 minutes prior to your appointment time.  Please note: We ask at that you not bring children with you during ultrasound (echo/ vascular) testing. Due to room size and safety concerns, children are not allowed in the ultrasound rooms during exams. Our front office staff cannot provide observation of children in our lobby area while testing is being conducted. An adult accompanying a patient to their appointment will only be allowed in the ultrasound room at the discretion of the ultrasound technician under special circumstances. We apologize for any inconvenience.  Your physician recommends that you schedule a follow-up appointment  6 months with echocardiogram (June) Call office in April  to schedule an appointment  If you have any questions or concerns before your next appointment please send us  a message through [place] or call our office at [phone].    TO LEAVE A MESSAGE FOR THE NURSE SELECT OPTION 2, PLEASE LEAVE A MESSAGE INCLUDING: YOUR NAME DATE OF BIRTH CALL BACK NUMBER REASON FOR CALL**this is important as we prioritize the call backs  YOU WILL RECEIVE A CALL BACK THE SAME DAY AS LONG AS YOU CALL BEFORE 4:00 PM At the Advanced Heart Failure Clinic, you and your health needs are our priority. As part of our  continuing mission to provide you with exceptional heart care, we have created designated Provider Care Teams. These Care Teams include your primary Cardiologist (physician) and Advanced Practice Providers (APPs- Physician Assistants and Nurse Practitioners) who all work together to provide you with the care you need, when you need it.   You may see any of the following providers on your designated Care Team at your next follow up: Dr Toribio Fuel Dr Ezra Shuck Dr. Morene Brownie Greig Mosses, NP Caffie Shed, [place] [facility] [place], [place] Beckey Coe, NP Jordan Lee, NP Ellouise Class, NP Tinnie Redman, PharmD Jaun Bash, PharmD   Please be sure to bring in all your medications bottles to every appointment.    Thank you for choosing [facility]

## 2024-08-11 ENCOUNTER — Other Ambulatory Visit (HOSPITAL_COMMUNITY): Payer: Self-pay | Admitting: Cardiology

## 2024-08-11 NOTE — Telephone Encounter (Signed)
 Patient notified refill was sent to pharmacy.
